# Patient Record
Sex: Female | Born: 1960
Health system: Southern US, Community
[De-identification: ages and names within clinical notes are randomized; demographics above are authoritative.]

## PROBLEM LIST (undated history)

## (undated) DIAGNOSIS — F329 Major depressive disorder, single episode, unspecified: Secondary | ICD-10-CM

## (undated) DIAGNOSIS — F319 Bipolar disorder, unspecified: Secondary | ICD-10-CM

## (undated) DIAGNOSIS — N200 Calculus of kidney: Secondary | ICD-10-CM

## (undated) DIAGNOSIS — B1920 Unspecified viral hepatitis C without hepatic coma: Secondary | ICD-10-CM

## (undated) DIAGNOSIS — F32A Depression, unspecified: Secondary | ICD-10-CM

## (undated) DIAGNOSIS — F419 Anxiety disorder, unspecified: Secondary | ICD-10-CM

---

## 2004-04-17 ENCOUNTER — Emergency Department (HOSPITAL_COMMUNITY): Admission: EM | Admit: 2004-04-17 | Discharge: 2004-04-17 | Payer: Self-pay

## 2006-04-19 ENCOUNTER — Emergency Department (HOSPITAL_COMMUNITY): Admission: EM | Admit: 2006-04-19 | Discharge: 2006-04-19 | Payer: Self-pay | Admitting: *Deleted

## 2009-04-18 ENCOUNTER — Emergency Department (HOSPITAL_COMMUNITY): Admission: EM | Admit: 2009-04-18 | Discharge: 2009-04-19 | Payer: Self-pay | Admitting: Emergency Medicine

## 2009-04-19 ENCOUNTER — Ambulatory Visit: Payer: Self-pay | Admitting: Psychiatry

## 2009-04-19 ENCOUNTER — Inpatient Hospital Stay (HOSPITAL_COMMUNITY): Admission: RE | Admit: 2009-04-19 | Discharge: 2009-04-25 | Payer: Self-pay | Admitting: Psychiatry

## 2009-07-14 ENCOUNTER — Emergency Department (HOSPITAL_COMMUNITY): Admission: EM | Admit: 2009-07-14 | Discharge: 2009-07-15 | Payer: Self-pay | Admitting: Emergency Medicine

## 2010-02-23 ENCOUNTER — Inpatient Hospital Stay (HOSPITAL_COMMUNITY): Admission: EM | Admit: 2010-02-23 | Discharge: 2010-03-02 | Payer: Self-pay | Admitting: Psychiatry

## 2010-02-23 ENCOUNTER — Ambulatory Visit: Payer: Self-pay | Admitting: Psychiatry

## 2010-02-23 ENCOUNTER — Emergency Department (HOSPITAL_COMMUNITY): Admission: EM | Admit: 2010-02-23 | Discharge: 2010-02-23 | Payer: Self-pay | Admitting: Emergency Medicine

## 2010-03-21 ENCOUNTER — Ambulatory Visit: Payer: Self-pay | Admitting: Family Medicine

## 2010-03-21 LAB — CONVERTED CEMR LAB
Amphetamine Screen, Ur: NEGATIVE
Barbiturate Quant, Ur: NEGATIVE
Benzodiazepines.: NEGATIVE
Cocaine Metabolites: NEGATIVE
Creatinine,U: 43.7 mg/dL
Marijuana Metabolite: NEGATIVE
Methadone: NEGATIVE
Opiate Screen, Urine: NEGATIVE
Phencyclidine (PCP): NEGATIVE
Propoxyphene: NEGATIVE

## 2010-03-26 ENCOUNTER — Ambulatory Visit (HOSPITAL_COMMUNITY): Admission: RE | Admit: 2010-03-26 | Discharge: 2010-03-26 | Payer: Self-pay | Admitting: Family Medicine

## 2010-04-06 ENCOUNTER — Emergency Department (HOSPITAL_COMMUNITY): Admission: EM | Admit: 2010-04-06 | Discharge: 2010-04-09 | Payer: Self-pay | Admitting: Emergency Medicine

## 2010-04-13 ENCOUNTER — Emergency Department (HOSPITAL_COMMUNITY): Admission: EM | Admit: 2010-04-13 | Discharge: 2010-04-13 | Payer: Self-pay | Admitting: Emergency Medicine

## 2010-04-19 ENCOUNTER — Ambulatory Visit: Payer: Self-pay | Admitting: Internal Medicine

## 2010-04-19 ENCOUNTER — Encounter (INDEPENDENT_AMBULATORY_CARE_PROVIDER_SITE_OTHER): Payer: Self-pay | Admitting: Family Medicine

## 2010-04-19 LAB — CONVERTED CEMR LAB
ALT: 20 units/L (ref 0–35)
AST: 21 units/L (ref 0–37)
Albumin: 4.2 g/dL (ref 3.5–5.2)
Alkaline Phosphatase: 94 units/L (ref 39–117)
BUN: 10 mg/dL (ref 6–23)
Basophils Absolute: 0 10*3/uL (ref 0.0–0.1)
Basophils Relative: 0 % (ref 0–1)
CO2: 19 meq/L (ref 19–32)
Calcium: 8.8 mg/dL (ref 8.4–10.5)
Chloride: 106 meq/L (ref 96–112)
Cholesterol: 212 mg/dL — ABNORMAL HIGH (ref 0–200)
Creatinine, Ser: 0.62 mg/dL (ref 0.40–1.20)
Eosinophils Absolute: 0.2 10*3/uL (ref 0.0–0.7)
Eosinophils Relative: 2 % (ref 0–5)
Glucose, Bld: 87 mg/dL (ref 70–99)
HCT: 40.4 % (ref 36.0–46.0)
HDL: 65 mg/dL (ref >39)
Hemoglobin: 13.7 g/dL (ref 12.0–15.0)
LDL Cholesterol: 128 mg/dL — ABNORMAL HIGH (ref 0–99)
Lymphocytes Relative: 19 % (ref 12–46)
Lymphs Abs: 2.1 10*3/uL (ref 0.7–4.0)
MCHC: 33.9 g/dL (ref 30.0–36.0)
MCV: 99.8 fL (ref 78.0–100.0)
Monocytes Absolute: 0.6 10*3/uL (ref 0.1–1.0)
Monocytes Relative: 5 % (ref 3–12)
Neutro Abs: 8.2 10*3/uL — ABNORMAL HIGH (ref 1.7–7.7)
Neutrophils Relative %: 73 % (ref 43–77)
Platelets: 310 10*3/uL (ref 150–400)
Potassium: 4.3 meq/L (ref 3.5–5.3)
RBC: 4.05 M/uL (ref 3.87–5.11)
RDW: 14.2 % (ref 11.5–15.5)
Sodium: 136 meq/L (ref 135–145)
Total Bilirubin: 0.5 mg/dL (ref 0.3–1.2)
Total CHOL/HDL Ratio: 3.3
Total Protein: 7.6 g/dL (ref 6.0–8.3)
Triglycerides: 96 mg/dL (ref <150)
VLDL: 19 mg/dL (ref 0–40)
Vit D, 25-Hydroxy: 20 ng/mL — ABNORMAL LOW (ref 30–89)
WBC: 11.1 10*3/uL — ABNORMAL HIGH (ref 4.0–10.5)

## 2010-04-27 ENCOUNTER — Ambulatory Visit: Payer: Self-pay | Admitting: Internal Medicine

## 2010-05-01 ENCOUNTER — Ambulatory Visit (HOSPITAL_COMMUNITY): Admission: RE | Admit: 2010-05-01 | Discharge: 2010-05-01 | Payer: Self-pay | Admitting: Family Medicine

## 2010-10-27 ENCOUNTER — Emergency Department (HOSPITAL_COMMUNITY)
Admission: EM | Admit: 2010-10-27 | Discharge: 2010-10-27 | Payer: Self-pay | Source: Home / Self Care | Admitting: Emergency Medicine

## 2010-11-05 ENCOUNTER — Emergency Department (HOSPITAL_COMMUNITY)
Admission: EM | Admit: 2010-11-05 | Discharge: 2010-12-11 | Disposition: A | Discharge status: Still In Hospital | Payer: Self-pay | Source: Home / Self Care | Admitting: Emergency Medicine

## 2010-11-06 DIAGNOSIS — F1920 Other psychoactive substance dependence, uncomplicated: Secondary | ICD-10-CM

## 2010-11-07 DIAGNOSIS — F1920 Other psychoactive substance dependence, uncomplicated: Secondary | ICD-10-CM

## 2010-11-08 DIAGNOSIS — F1920 Other psychoactive substance dependence, uncomplicated: Secondary | ICD-10-CM

## 2011-02-04 LAB — RAPID URINE DRUG SCREEN, HOSP PERFORMED
Amphetamines: NOT DETECTED
Barbiturates: NOT DETECTED
Benzodiazepines: POSITIVE — AB
Cocaine: POSITIVE — AB
Opiates: NOT DETECTED
Tetrahydrocannabinol: POSITIVE — AB

## 2011-02-04 LAB — CBC
HCT: 40.4 % (ref 36.0–46.0)
Hemoglobin: 13.7 g/dL (ref 12.0–15.0)
MCH: 34.1 pg — ABNORMAL HIGH (ref 26.0–34.0)
MCHC: 33.9 g/dL (ref 30.0–36.0)
MCV: 100.5 fL — ABNORMAL HIGH (ref 78.0–100.0)
Platelets: 298 10*3/uL (ref 150–400)
RBC: 4.02 MIL/uL (ref 3.87–5.11)
RDW: 13 % (ref 11.5–15.5)
WBC: 13.3 10*3/uL — ABNORMAL HIGH (ref 4.0–10.5)

## 2011-02-04 LAB — BASIC METABOLIC PANEL
BUN: 9 mg/dL (ref 6–23)
CO2: 25 mEq/L (ref 19–32)
Calcium: 9.1 mg/dL (ref 8.4–10.5)
Chloride: 107 mEq/L (ref 96–112)
Creatinine, Ser: 0.68 mg/dL (ref 0.4–1.2)
GFR calc Af Amer: >60 mL/min (ref >60)
GFR calc non Af Amer: >60 mL/min (ref >60)
Glucose, Bld: 77 mg/dL (ref 70–99)
Potassium: 3.3 mEq/L — ABNORMAL LOW (ref 3.5–5.1)
Sodium: 138 mEq/L (ref 135–145)

## 2011-02-04 LAB — POCT PREGNANCY, URINE: Preg Test, Ur: NEGATIVE

## 2011-02-04 LAB — ETHANOL: Alcohol, Ethyl (B): <5 mg/dL (ref 0–10)

## 2011-02-04 LAB — DIFFERENTIAL
Basophils Absolute: 0.1 10*3/uL (ref 0.0–0.1)
Basophils Relative: 0 % (ref 0–1)
Eosinophils Absolute: 0.3 10*3/uL (ref 0.0–0.7)
Eosinophils Relative: 2 % (ref 0–5)
Lymphocytes Relative: 19 % (ref 12–46)
Lymphs Abs: 2.5 10*3/uL (ref 0.7–4.0)
Monocytes Absolute: 0.8 10*3/uL (ref 0.1–1.0)
Monocytes Relative: 6 % (ref 3–12)
Neutro Abs: 9.6 10*3/uL — ABNORMAL HIGH (ref 1.7–7.7)
Neutrophils Relative %: 72 % (ref 43–77)

## 2011-02-04 LAB — TRICYCLICS SCREEN, URINE: TCA Scrn: NOT DETECTED

## 2011-02-12 LAB — URINALYSIS, ROUTINE W REFLEX MICROSCOPIC
Bilirubin Urine: NEGATIVE
Glucose, UA: NEGATIVE mg/dL
Hgb urine dipstick: NEGATIVE
Ketones, ur: NEGATIVE mg/dL
Nitrite: NEGATIVE
Protein, ur: NEGATIVE mg/dL
Specific Gravity, Urine: 1.004 — ABNORMAL LOW (ref 1.005–1.030)
Urobilinogen, UA: 0.2 mg/dL (ref 0.0–1.0)
pH: 5 (ref 5.0–8.0)

## 2011-02-12 LAB — RAPID URINE DRUG SCREEN, HOSP PERFORMED
Amphetamines: NOT DETECTED
Barbiturates: NOT DETECTED
Benzodiazepines: NOT DETECTED
Cocaine: POSITIVE — AB
Opiates: POSITIVE — AB
Tetrahydrocannabinol: POSITIVE — AB

## 2011-02-12 LAB — POCT I-STAT, CHEM 8
BUN: 9 mg/dL (ref 6–23)
Calcium, Ion: 1.14 mmol/L (ref 1.12–1.32)
Chloride: 107 mEq/L (ref 96–112)
Creatinine, Ser: 1 mg/dL (ref 0.4–1.2)
Glucose, Bld: 95 mg/dL (ref 70–99)
HCT: 43 % (ref 36.0–46.0)
Hemoglobin: 14.6 g/dL (ref 12.0–15.0)
Potassium: 3.9 mEq/L (ref 3.5–5.1)
Sodium: 141 mEq/L (ref 135–145)
TCO2: 21 mmol/L (ref 0–100)

## 2011-02-12 LAB — ETHANOL
Alcohol, Ethyl (B): 214 mg/dL — ABNORMAL HIGH (ref 0–10)
Alcohol, Ethyl (B): <5 mg/dL (ref 0–10)

## 2011-02-13 LAB — DIFFERENTIAL
Basophils Absolute: 0 10*3/uL (ref 0.0–0.1)
Basophils Relative: 0 % (ref 0–1)
Eosinophils Absolute: 0.2 10*3/uL (ref 0.0–0.7)
Eosinophils Relative: 1 % (ref 0–5)
Lymphocytes Relative: 20 % (ref 12–46)
Lymphs Abs: 2.9 10*3/uL (ref 0.7–4.0)
Monocytes Absolute: 0.8 10*3/uL (ref 0.1–1.0)
Monocytes Relative: 5 % (ref 3–12)
Neutro Abs: 10.8 10*3/uL — ABNORMAL HIGH (ref 1.7–7.7)
Neutrophils Relative %: 74 % (ref 43–77)

## 2011-02-13 LAB — RAPID URINE DRUG SCREEN, HOSP PERFORMED
Amphetamines: NOT DETECTED
Barbiturates: NOT DETECTED
Benzodiazepines: NOT DETECTED
Cocaine: POSITIVE — AB
Opiates: NOT DETECTED
Tetrahydrocannabinol: POSITIVE — AB

## 2011-02-13 LAB — COMPREHENSIVE METABOLIC PANEL
ALT: 32 U/L (ref 0–35)
AST: 32 U/L (ref 0–37)
Albumin: 3.9 g/dL (ref 3.5–5.2)
Alkaline Phosphatase: 82 U/L (ref 39–117)
BUN: 4 mg/dL — ABNORMAL LOW (ref 6–23)
CO2: 20 mEq/L (ref 19–32)
Calcium: 8.6 mg/dL (ref 8.4–10.5)
Chloride: 108 mEq/L (ref 96–112)
Creatinine, Ser: 0.48 mg/dL (ref 0.4–1.2)
GFR calc Af Amer: >60 mL/min (ref >60)
GFR calc non Af Amer: >60 mL/min (ref >60)
Glucose, Bld: 95 mg/dL (ref 70–99)
Potassium: 3.5 mEq/L (ref 3.5–5.1)
Sodium: 138 mEq/L (ref 135–145)
Total Bilirubin: 0.4 mg/dL (ref 0.3–1.2)
Total Protein: 7.9 g/dL (ref 6.0–8.3)

## 2011-02-13 LAB — CBC
HCT: 38.5 % (ref 36.0–46.0)
Hemoglobin: 12.9 g/dL (ref 12.0–15.0)
MCHC: 33.6 g/dL (ref 30.0–36.0)
MCV: 103.3 fL — ABNORMAL HIGH (ref 78.0–100.0)
Platelets: 258 10*3/uL (ref 150–400)
RBC: 3.73 MIL/uL — ABNORMAL LOW (ref 3.87–5.11)
RDW: 13.9 % (ref 11.5–15.5)
WBC: 14.7 10*3/uL — ABNORMAL HIGH (ref 4.0–10.5)

## 2011-02-13 LAB — LIPASE, BLOOD: Lipase: 24 U/L (ref 11–59)

## 2011-02-13 LAB — ETHANOL: Alcohol, Ethyl (B): 135 mg/dL — ABNORMAL HIGH (ref 0–10)

## 2011-03-05 LAB — RAPID URINE DRUG SCREEN, HOSP PERFORMED
Amphetamines: NOT DETECTED
Barbiturates: NOT DETECTED
Benzodiazepines: NOT DETECTED
Cocaine: POSITIVE — AB
Opiates: NOT DETECTED
Tetrahydrocannabinol: POSITIVE — AB

## 2011-03-05 LAB — COMPREHENSIVE METABOLIC PANEL
ALT: 41 U/L — ABNORMAL HIGH (ref 0–35)
AST: 35 U/L (ref 0–37)
Albumin: 3.7 g/dL (ref 3.5–5.2)
Alkaline Phosphatase: 114 U/L (ref 39–117)
BUN: 9 mg/dL (ref 6–23)
CO2: 23 mEq/L (ref 19–32)
Calcium: 9.2 mg/dL (ref 8.4–10.5)
Chloride: 107 mEq/L (ref 96–112)
Creatinine, Ser: 0.66 mg/dL (ref 0.4–1.2)
GFR calc Af Amer: >60 mL/min (ref >60)
GFR calc non Af Amer: >60 mL/min (ref >60)
Glucose, Bld: 93 mg/dL (ref 70–99)
Potassium: 3.7 mEq/L (ref 3.5–5.1)
Sodium: 136 mEq/L (ref 135–145)
Total Bilirubin: 0.5 mg/dL (ref 0.3–1.2)
Total Protein: 7.8 g/dL (ref 6.0–8.3)

## 2011-03-05 LAB — CBC
HCT: 40.4 % (ref 36.0–46.0)
Hemoglobin: 13.9 g/dL (ref 12.0–15.0)
MCHC: 34.3 g/dL (ref 30.0–36.0)
MCV: 100.2 fL — ABNORMAL HIGH (ref 78.0–100.0)
Platelets: 221 10*3/uL (ref 150–400)
RBC: 4.03 MIL/uL (ref 3.87–5.11)
RDW: 15.1 % (ref 11.5–15.5)
WBC: 12.1 10*3/uL — ABNORMAL HIGH (ref 4.0–10.5)

## 2011-03-05 LAB — URINALYSIS, ROUTINE W REFLEX MICROSCOPIC
Bilirubin Urine: NEGATIVE
Glucose, UA: NEGATIVE mg/dL
Hgb urine dipstick: NEGATIVE
Ketones, ur: 15 mg/dL — AB
Nitrite: NEGATIVE
Protein, ur: NEGATIVE mg/dL
Specific Gravity, Urine: 1.033 — ABNORMAL HIGH (ref 1.005–1.030)
Urobilinogen, UA: 1 mg/dL (ref 0.0–1.0)
pH: 5 (ref 5.0–8.0)

## 2011-03-05 LAB — URINE CULTURE: Colony Count: 70000

## 2011-03-05 LAB — ACETAMINOPHEN LEVEL: Acetaminophen (Tylenol), Serum: <10 ug/mL — ABNORMAL LOW (ref 10–30)

## 2011-03-05 LAB — DIFFERENTIAL
Basophils Absolute: 0 10*3/uL (ref 0.0–0.1)
Basophils Relative: 0 % (ref 0–1)
Eosinophils Absolute: 0.2 10*3/uL (ref 0.0–0.7)
Eosinophils Relative: 2 % (ref 0–5)
Lymphocytes Relative: 17 % (ref 12–46)
Lymphs Abs: 2 10*3/uL (ref 0.7–4.0)
Monocytes Absolute: 0.9 10*3/uL (ref 0.1–1.0)
Monocytes Relative: 8 % (ref 3–12)
Neutro Abs: 8.9 10*3/uL — ABNORMAL HIGH (ref 1.7–7.7)
Neutrophils Relative %: 74 % (ref 43–77)

## 2011-03-05 LAB — URINE MICROSCOPIC-ADD ON

## 2011-03-05 LAB — TRICYCLICS SCREEN, URINE: TCA Scrn: POSITIVE — AB

## 2011-03-05 LAB — ETHANOL: Alcohol, Ethyl (B): <5 mg/dL (ref 0–10)

## 2011-03-05 LAB — SALICYLATE LEVEL: Salicylate Lvl: <4 mg/dL (ref 2.8–20.0)
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
## 2011-07-01 ENCOUNTER — Emergency Department (HOSPITAL_COMMUNITY)
Admission: EM | Admit: 2011-07-01 | Discharge: 2011-07-02 | Disposition: A | Discharge status: Home / Self Care | Payer: Self-pay | Attending: Emergency Medicine | Admitting: Emergency Medicine

## 2011-07-01 DIAGNOSIS — M25569 Pain in unspecified knee: Secondary | ICD-10-CM | POA: Insufficient documentation

## 2011-07-01 DIAGNOSIS — M25469 Effusion, unspecified knee: Secondary | ICD-10-CM | POA: Insufficient documentation

## 2011-07-02 ENCOUNTER — Emergency Department (HOSPITAL_COMMUNITY): Payer: Self-pay

## 2012-08-01 ENCOUNTER — Encounter (HOSPITAL_COMMUNITY): Payer: Self-pay | Admitting: Emergency Medicine

## 2012-08-01 ENCOUNTER — Emergency Department (HOSPITAL_COMMUNITY)
Admission: EM | Admit: 2012-08-01 | Discharge: 2012-08-01 | Disposition: A | Discharge status: Home / Self Care | Payer: Self-pay | Attending: Emergency Medicine | Admitting: Emergency Medicine

## 2012-08-01 DIAGNOSIS — F319 Bipolar disorder, unspecified: Secondary | ICD-10-CM | POA: Insufficient documentation

## 2012-08-01 DIAGNOSIS — F411 Generalized anxiety disorder: Secondary | ICD-10-CM | POA: Insufficient documentation

## 2012-08-01 DIAGNOSIS — M1990 Unspecified osteoarthritis, unspecified site: Secondary | ICD-10-CM

## 2012-08-01 DIAGNOSIS — M25469 Effusion, unspecified knee: Secondary | ICD-10-CM | POA: Insufficient documentation

## 2012-08-01 DIAGNOSIS — M25461 Effusion, right knee: Secondary | ICD-10-CM

## 2012-08-01 DIAGNOSIS — F17200 Nicotine dependence, unspecified, uncomplicated: Secondary | ICD-10-CM | POA: Insufficient documentation

## 2012-08-01 HISTORY — DX: Major depressive disorder, single episode, unspecified: F32.9

## 2012-08-01 HISTORY — DX: Depression, unspecified: F32.A

## 2012-08-01 HISTORY — DX: Bipolar disorder, unspecified: F31.9

## 2012-08-01 HISTORY — DX: Anxiety disorder, unspecified: F41.9

## 2012-08-01 MED ORDER — HYDROCODONE-ACETAMINOPHEN 5-500 MG PO TABS: 1.0000 | ORAL_TABLET | Freq: Four times a day (QID) | ORAL | Status: AC | PRN

## 2012-08-01 MED ORDER — LIDOCAINE HCL (PF) 1 % IJ SOLN
INTRAMUSCULAR | Status: AC
  Filled 2012-08-01: qty 5

## 2012-08-01 MED ORDER — METHYLPREDNISOLONE ACETATE 40 MG/ML IJ SUSP
40.0000 mg | Freq: Once | INTRAMUSCULAR | Status: DC
  Filled 2012-08-01: qty 5

## 2012-08-01 MED ORDER — HYDROCODONE-ACETAMINOPHEN 5-325 MG PO TABS
2.0000 | ORAL_TABLET | Freq: Once | ORAL | Status: AC
  Administered 2012-08-01: 2 via ORAL
  Filled 2012-08-01: qty 2

## 2012-08-01 NOTE — ED Notes (Signed)
Pt. Stated, My rt. Knee went out again.

## 2012-08-01 NOTE — ED Provider Notes (Addendum)
History   This chart was scribed for No att. providers found by Toya Smothers. The patient was seen in room TR04C/TR04C. Patient's care was started at 1247.  CSN: [id number]  Arrival date & time 08/01/12  1247   None     Chief Complaint  Patient presents with  . Knee Pain   Patient is a 51 y.o. female presenting with knee pain. The history is provided by the patient. No language interpreter was used.  Knee Pain Pertinent negatives include no chest pain, no abdominal pain and no headaches.    Kimberly [name] is a 51 y.o. female with a h/o of knee pain who presents to the Emergency Department complaining of 5 hours of of recurrent moderate right knee pain. Pt reports that her right knee suddenly "gave out" while walking, causing her to fall. Pain is moderate, constant, aggravated with flexion and extension, and relieved with stabilization. Pain has increased acutely since onset. Pt denotes that in the past pain has been treated with cortisone shots. She denies back pain, neck pain, LOC, fever, and cough.    Past Medical History  Diagnosis Date  . Depression   . Bipolar 1 disorder   . Anxiety     History reviewed. No pertinent past surgical history.  No family history on file.  History  Substance Use Topics  . Smoking status: Current Everyday Smoker  . Smokeless tobacco: Not on file  . Alcohol Use: Yes   Review of Systems  Constitutional: Negative for fatigue.  HENT: Negative for congestion, sinus pressure and ear discharge.   Eyes: Negative for discharge.  Respiratory: Negative for cough.   Cardiovascular: Negative for chest pain.  Gastrointestinal: Negative for abdominal pain and diarrhea.  Genitourinary: Negative for frequency and hematuria.  Musculoskeletal: Positive for arthralgias (Right Knee Pain). Negative for back pain.  Skin: Negative for rash.  Neurological: Negative for seizures and headaches.  Hematological: Negative.   Psychiatric/Behavioral: Negative for  hallucinations.  All other systems reviewed and are negative.    Allergies  Review of patient's allergies indicates no known allergies.  Home Medications  No current outpatient prescriptions on file.  BP 98/65  Pulse 66  Temp 98.4 F (36.9 C) (Oral)  Resp 20  SpO2 97%  LMP 07/14/2012  Physical Exam  Nursing note and vitals reviewed. Constitutional: She is oriented to person, place, and time. She appears well-developed and well-nourished. No distress.  HENT:  Head: Normocephalic and atraumatic.  Neck: Normal range of motion. Neck supple.  Musculoskeletal:       The right knee is noted to have a small to moderate sized effusion.  There is crepitus with range of motion.  It appears to be stable ap/laterally.  Distal pulses, motor, and sensory are intact.  Neurological: She is alert and oriented to person, place, and time.  Skin: Skin is warm and dry. She is not diaphoretic.    ED Course  Procedures (including critical care time) DIAGNOSTIC STUDIES: Oxygen Saturation is 97% on room air, normal by my interpretation.    COORDINATION OF CARE: 13:40- evaluated Pt. Pt is awake, alert, and oriented. 14:00- Ordered methylPREDNISolone acetate (DEPO-MEDROL) injection 40 mg Once.  Labs Reviewed - No data to display No results found.   No diagnosis found.  After consent was obtained, using sterile technique the right knee was prepped and plain Lidocaine 1% was used as local anesthetic. The joint was entered and 10 ml's of yellow, clear colored fluid was withdrawn.  Steroid 40 mg  and 5 ml plain Lidocaine was then injected and the needle withdrawn.  The procedure was well tolerated.  The patient is asked to continue to rest the joint for a few more days before resuming regular activities.  It may be more painful for the first 1-2 days.  Watch for fever, or increased swelling or persistent pain in the joint. Call or return to clinic prn if such symptoms occur or there is failure to  improve as anticipated.  MDM  Will treat with pain meds, immoblizer.  Follow up prn if not improving.      I personally performed the services described in this documentation, which was scribed in my presence. The recorded information has been reviewed and considered.      Kimberly Lyons, MD 08/01/12 1410  Kimberly Lyons, MD 09/10/12 [phone]

## 2012-08-01 NOTE — Progress Notes (Signed)
Orthopedic Tech Progress Note Patient Details:  Kimberly [name] 1961-04-17 [id number]  Ortho Devices Type of Ortho Device: Crutches;Knee Immobilizer Ortho Device/Splint Location: (R) LE Ortho Device/Splint Interventions: Application   Jennye Moccasin 08/01/2012, 2:43 PM

## 2012-10-16 ENCOUNTER — Encounter (HOSPITAL_COMMUNITY): Payer: Self-pay | Admitting: *Deleted

## 2012-10-16 ENCOUNTER — Emergency Department (HOSPITAL_COMMUNITY)
Admission: EM | Admit: 2012-10-16 | Discharge: 2012-10-16 | Disposition: A | Discharge status: Home / Self Care | Payer: Self-pay | Attending: Emergency Medicine | Admitting: Emergency Medicine

## 2012-10-16 ENCOUNTER — Emergency Department (HOSPITAL_COMMUNITY): Payer: Self-pay

## 2012-10-16 DIAGNOSIS — F17200 Nicotine dependence, unspecified, uncomplicated: Secondary | ICD-10-CM | POA: Insufficient documentation

## 2012-10-16 DIAGNOSIS — E079 Disorder of thyroid, unspecified: Secondary | ICD-10-CM | POA: Insufficient documentation

## 2012-10-16 DIAGNOSIS — M25569 Pain in unspecified knee: Secondary | ICD-10-CM | POA: Insufficient documentation

## 2012-10-16 DIAGNOSIS — F1010 Alcohol abuse, uncomplicated: Secondary | ICD-10-CM | POA: Insufficient documentation

## 2012-10-16 DIAGNOSIS — Z8659 Personal history of other mental and behavioral disorders: Secondary | ICD-10-CM | POA: Insufficient documentation

## 2012-10-16 MED ORDER — ACETAMINOPHEN 325 MG PO TABS
650.0000 mg | ORAL_TABLET | Freq: Once | ORAL | Status: AC
  Administered 2012-10-16: 650 mg via ORAL
  Filled 2012-10-16: qty 2
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
## 2012-10-16 NOTE — ED Provider Notes (Signed)
Medical screening examination/treatment/procedure(s) were performed by non-physician practitioner and as supervising physician I was immediately available for consultation/collaboration.   Charles B. Bernette Mayers, MD 10/16/12 [phone]

## 2013-12-16 ENCOUNTER — Emergency Department (HOSPITAL_COMMUNITY): Payer: Self-pay

## 2013-12-16 ENCOUNTER — Encounter (HOSPITAL_COMMUNITY): Payer: Self-pay | Admitting: Emergency Medicine

## 2013-12-16 ENCOUNTER — Emergency Department (HOSPITAL_COMMUNITY)
Admission: EM | Admit: 2013-12-16 | Discharge: 2013-12-16 | Disposition: A | Discharge status: Home / Self Care | Payer: Self-pay | Attending: Emergency Medicine | Admitting: Emergency Medicine

## 2013-12-16 DIAGNOSIS — S0181XA Laceration without foreign body of other part of head, initial encounter: Secondary | ICD-10-CM

## 2013-12-16 DIAGNOSIS — F3289 Other specified depressive episodes: Secondary | ICD-10-CM | POA: Insufficient documentation

## 2013-12-16 DIAGNOSIS — Z79899 Other long term (current) drug therapy: Secondary | ICD-10-CM | POA: Insufficient documentation

## 2013-12-16 DIAGNOSIS — F411 Generalized anxiety disorder: Secondary | ICD-10-CM | POA: Insufficient documentation

## 2013-12-16 DIAGNOSIS — S0590XA Unspecified injury of unspecified eye and orbit, initial encounter: Secondary | ICD-10-CM | POA: Insufficient documentation

## 2013-12-16 DIAGNOSIS — S0990XA Unspecified injury of head, initial encounter: Secondary | ICD-10-CM | POA: Insufficient documentation

## 2013-12-16 DIAGNOSIS — Z8639 Personal history of other endocrine, nutritional and metabolic disease: Secondary | ICD-10-CM | POA: Insufficient documentation

## 2013-12-16 DIAGNOSIS — Z862 Personal history of diseases of the blood and blood-forming organs and certain disorders involving the immune mechanism: Secondary | ICD-10-CM | POA: Insufficient documentation

## 2013-12-16 DIAGNOSIS — F329 Major depressive disorder, single episode, unspecified: Secondary | ICD-10-CM | POA: Insufficient documentation

## 2013-12-16 DIAGNOSIS — F17200 Nicotine dependence, unspecified, uncomplicated: Secondary | ICD-10-CM | POA: Insufficient documentation

## 2013-12-16 DIAGNOSIS — Z23 Encounter for immunization: Secondary | ICD-10-CM | POA: Insufficient documentation

## 2013-12-16 DIAGNOSIS — S0180XA Unspecified open wound of other part of head, initial encounter: Secondary | ICD-10-CM | POA: Insufficient documentation

## 2013-12-16 MED ORDER — TETRACAINE HCL 0.5 % OP SOLN
1.0000 [drp] | Freq: Once | OPHTHALMIC | Status: DC
  Filled 2013-12-16: qty 2

## 2013-12-16 MED ORDER — TETANUS-DIPHTH-ACELL PERTUSSIS 5-2.5-18.5 LF-MCG/0.5 IM SUSP
0.5000 mL | Freq: Once | INTRAMUSCULAR | Status: AC
  Administered 2013-12-16: 0.5 mL via INTRAMUSCULAR
  Filled 2013-12-16: qty 0.5

## 2013-12-16 MED ORDER — OXYCODONE-ACETAMINOPHEN 5-325 MG PO TABS
1.0000 | ORAL_TABLET | Freq: Once | ORAL | Status: AC
  Administered 2013-12-16: 1 via ORAL
  Filled 2013-12-16: qty 1

## 2013-12-16 MED ORDER — FLUORESCEIN SODIUM 1 MG OP STRP
1.0000 | ORAL_STRIP | Freq: Once | OPHTHALMIC | Status: DC
Start: 2013-12-16 — End: 2013-12-16
  Filled 2013-12-16: qty 1

## 2013-12-16 MED ORDER — HYDROCODONE-ACETAMINOPHEN 5-325 MG PO TABS: 1.0000 | ORAL_TABLET | ORAL | Status: DC | PRN
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
## 2013-12-16 NOTE — ED Notes (Signed)
Pt transported to CT ?
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
## 2013-12-16 NOTE — ED Notes (Signed)
Pt given d/c instructions and verbalized understanding.  

## 2013-12-16 NOTE — Discharge Instructions (Signed)
Take vicodin as prescribed for severe pain.  Do not drive within four hours of taking this medication (may cause drowsiness or confusion).   Take ibuprofen as well; up to 800mg  three times a day with food.  Apply an ice pack to the left side of your face for 15-20 minutes, 2 times a day.   Stitches should be removed in 3-5 days by your primary care doctor or [facility].  Return sooner if you develop fever or drainage of pus from wound. You should return to the ER if you develop worsening headache, blurred vision, change in speech/behavior, excessive drowsiness, or 2 or more episodes of vomiting over the next 24 hours.  Facial Laceration A facial laceration is a cut on the face. These injuries can be painful and cause bleeding. Some cuts may need to be closed with stitches (sutures), skin adhesive strips, or wound glue. Cuts usually heal quickly but can leave a scar. It can take 1 2 years for the scar to go away completely. HOME CARE   Only take medicines as told by your doctor.  Follow your doctor's instructions for wound care. For Stitches:  Keep the cut clean and dry.  If you have a bandage (dressing), change it at least once a day. Change the bandage if it gets wet or dirty, or as told by your doctor.  Wash the cut with soap and water 2 times a day. Rinse the cut with water. Pat it dry with a clean towel.  Put a thin layer of medicated cream on the cut as told by your doctor.  You may shower after the first 24 hours. Do not soak the cut in water until the stitches are removed.  Have your stitches removed as told by your doctor.  Do not wear any makeup until a few days after your stitches are removed. For Skin Adhesive Strips:  Keep the cut clean and dry.  Do not get the strips wet. You may take a bath, but be careful to keep the cut dry.  If the cut gets wet, pat it dry with a clean towel.  The strips will fall off on their own. Do not remove the strips that are still  stuck to the cut. For Wound Glue:  You may shower or take baths. Do not soak or scrub the cut. Do not swim. Avoid heavy sweating until the glue falls off on its own. After a shower or bath, pat the cut dry with a clean towel.  Do not put medicine or makeup on your cut until the glue falls off.  If you have a bandage, do not put tape over the glue.  Avoid lots of sunlight or tanning lamps until the glue falls off.  The glue will fall off on its own in 5 10 days. Do not pick at the glue. After Healing: Put sunscreen on the cut for the first year to reduce your scar. GET HELP RIGHT AWAY IF:   Your cut area gets red, painful, or puffy (swollen).  You see a yellowish-white fluid (pus) coming from the cut.  You have chills or a fever. MAKE SURE YOU:   Understand these instructions.  Will watch your condition.  Will get help right away if you are not doing well or get worse. Document Released: 04/29/2008 Document Revised: 09/01/2013 Document Reviewed: 06/24/2013 [facility] Patient Information 2014 [place], [place].

## 2013-12-17 NOTE — ED Provider Notes (Signed)
Medical screening examination/treatment/procedure(s) were performed by non-physician practitioner and as supervising physician I was immediately available for consultation/collaboration.     Elyn Peers, MD 12/17/13 [phone]

## 2013-12-22 ENCOUNTER — Emergency Department (INDEPENDENT_AMBULATORY_CARE_PROVIDER_SITE_OTHER)
Admission: EM | Admit: 2013-12-22 | Discharge: 2013-12-22 | Disposition: A | Discharge status: Home / Self Care | Payer: Self-pay | Source: Home / Self Care | Attending: Emergency Medicine | Admitting: Emergency Medicine

## 2013-12-22 ENCOUNTER — Encounter (HOSPITAL_COMMUNITY): Payer: Self-pay | Admitting: Emergency Medicine

## 2013-12-22 DIAGNOSIS — T148XXA Other injury of unspecified body region, initial encounter: Secondary | ICD-10-CM

## 2013-12-22 DIAGNOSIS — IMO0002 Reserved for concepts with insufficient information to code with codable children: Secondary | ICD-10-CM
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
## 2013-12-22 NOTE — Discharge Instructions (Signed)
Wash with soap and water, apply antibiotic ointment for next 2 to 3 days.  Cover with Band Aid today, then can leave open.

## 2014-08-25 ENCOUNTER — Encounter (HOSPITAL_COMMUNITY): Payer: Self-pay | Admitting: Emergency Medicine

## 2014-08-25 ENCOUNTER — Emergency Department (HOSPITAL_COMMUNITY)
Admission: EM | Admit: 2014-08-25 | Discharge: 2014-08-25 | Disposition: A | Discharge status: Home / Self Care | Payer: Self-pay | Attending: Emergency Medicine | Admitting: Emergency Medicine

## 2014-08-25 DIAGNOSIS — Z8639 Personal history of other endocrine, nutritional and metabolic disease: Secondary | ICD-10-CM | POA: Insufficient documentation

## 2014-08-25 DIAGNOSIS — F419 Anxiety disorder, unspecified: Secondary | ICD-10-CM | POA: Insufficient documentation

## 2014-08-25 DIAGNOSIS — F329 Major depressive disorder, single episode, unspecified: Secondary | ICD-10-CM | POA: Insufficient documentation

## 2014-08-25 DIAGNOSIS — Z720 Tobacco use: Secondary | ICD-10-CM | POA: Insufficient documentation

## 2014-08-25 DIAGNOSIS — Z79899 Other long term (current) drug therapy: Secondary | ICD-10-CM | POA: Insufficient documentation

## 2014-08-25 DIAGNOSIS — F1010 Alcohol abuse, uncomplicated: Secondary | ICD-10-CM | POA: Insufficient documentation

## 2014-08-25 DIAGNOSIS — F1210 Cannabis abuse, uncomplicated: Secondary | ICD-10-CM | POA: Insufficient documentation

## 2014-08-25 LAB — COMPREHENSIVE METABOLIC PANEL
ALT: 82 U/L — ABNORMAL HIGH (ref 0–35)
AST: 63 U/L — ABNORMAL HIGH (ref 0–37)
Albumin: 4 g/dL (ref 3.5–5.2)
Alkaline Phosphatase: 116 U/L (ref 39–117)
Anion gap: 11 (ref 5–15)
BUN: 8 mg/dL (ref 6–23)
CO2: 25 mEq/L (ref 19–32)
Calcium: 9.1 mg/dL (ref 8.4–10.5)
Chloride: 106 mEq/L (ref 96–112)
Creatinine, Ser: 0.62 mg/dL (ref 0.50–1.10)
GFR calc Af Amer: >90 mL/min (ref >90)
GFR calc non Af Amer: >90 mL/min (ref >90)
Glucose, Bld: 100 mg/dL — ABNORMAL HIGH (ref 70–99)
Potassium: 4.1 mEq/L (ref 3.7–5.3)
Sodium: 142 mEq/L (ref 137–147)
Total Bilirubin: 0.3 mg/dL (ref 0.3–1.2)
Total Protein: 8.3 g/dL (ref 6.0–8.3)

## 2014-08-25 LAB — CBC
HCT: 43 % (ref 36.0–46.0)
Hemoglobin: 14.7 g/dL (ref 12.0–15.0)
MCH: 36.3 pg — ABNORMAL HIGH (ref 26.0–34.0)
MCHC: 34.2 g/dL (ref 30.0–36.0)
MCV: 106.2 fL — ABNORMAL HIGH (ref 78.0–100.0)
Platelets: 193 10*3/uL (ref 150–400)
RBC: 4.05 MIL/uL (ref 3.87–5.11)
RDW: 13.2 % (ref 11.5–15.5)
WBC: 10 10*3/uL (ref 4.0–10.5)

## 2014-08-25 LAB — RAPID URINE DRUG SCREEN, HOSP PERFORMED
Amphetamines: NOT DETECTED
Barbiturates: NOT DETECTED
Benzodiazepines: NOT DETECTED
Cocaine: NOT DETECTED
Opiates: NOT DETECTED
Tetrahydrocannabinol: POSITIVE — AB

## 2014-08-25 LAB — ETHANOL: Alcohol, Ethyl (B): <11 mg/dL (ref 0–11)

## 2014-08-25 LAB — SALICYLATE LEVEL: Salicylate Lvl: <2 mg/dL — ABNORMAL LOW (ref 2.8–20.0)

## 2014-08-25 LAB — ACETAMINOPHEN LEVEL: Acetaminophen (Tylenol), Serum: <15 ug/mL (ref 10–30)

## 2014-08-25 MED ORDER — LORAZEPAM 1 MG PO TABS
0.0000 mg | ORAL_TABLET | Freq: Four times a day (QID) | ORAL | Status: DC
  Administered 2014-08-25: 1 mg via ORAL
  Filled 2014-08-25: qty 1

## 2014-08-25 MED ORDER — LORAZEPAM 1 MG PO TABS: 0.0000 mg | ORAL_TABLET | Freq: Two times a day (BID) | ORAL | Status: DC

## 2014-08-25 MED ORDER — FLUOXETINE HCL 20 MG PO TABS: 20.0000 mg | ORAL_TABLET | Freq: Every day | ORAL | Status: DC

## 2014-08-25 MED ORDER — TRAZODONE HCL 50 MG PO TABS: 50.0000 mg | ORAL_TABLET | Freq: Every day | ORAL | Status: DC

## 2014-08-25 MED ORDER — RISPERIDONE 2 MG PO TABS: 2.0000 mg | ORAL_TABLET | Freq: Every day | ORAL | Status: DC
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
## 2014-08-25 NOTE — ED Notes (Signed)
Pt. Stated, "I am alcohol sick"

## 2014-08-25 NOTE — ED Notes (Signed)
To be discharged but Dr. Vanita Panda set up social work consult.

## 2014-08-25 NOTE — ED Notes (Signed)
TTS set up in room and pt speaking with consult. Pt has been wanded. No SI/HI

## 2014-08-25 NOTE — ED Notes (Signed)
PT placed in blue scrubs, security to wand pt.

## 2014-08-25 NOTE — Clinical Social Work Note (Signed)
Clinical Social Worker met with patient at bedside who states that she drinks 10 40 oz beers a day and has been for many years.  Patient states that she has pre-arranged to go to [facility] and they required her to come to the ED for detox and medical clearance prior to admission.  CSW called [facility] and they state that patient has a screening appointment arranged for Monday 08/29/2014 in which she is supposed to meet them in parking lot of the [place] off [address].  Arbie Cookey at [facility] states that patient can be discharged from the hospital and meet in [place] parking log at 07:40am on Monday morning with discharge paperwork.  CSW has relayed message to patient at bedside.  Patient states that she was not told the same information and must remain here for complete detox until Monday.  CSW encouraged patient to contact [facility] directly for them to provide the same instructions already provided by CSW.  CSW to provide patient with two bus passes in order to get home today and to [place] on Monday.  Clinical Social Worker will sign off for now as social work intervention is no longer needed. Please consult [place] again if new need arises.  Barbette Or, [place]

## 2014-08-25 NOTE — BH Assessment (Signed)
Spoke with EDP Dr.Lockwood to obtain clinicals prior to assessing patient.  Shaune Pollack, MS, [place] Assessment Counselor

## 2014-08-25 NOTE — ED Provider Notes (Addendum)
CSN: [id number]     Arrival date & time 08/25/14  0847 History   First MD Initiated Contact with Patient 08/25/14 [phone]     Chief Complaint  Patient presents with  . Medical Clearance  . Alcohol Problem     (Consider location/radiation/quality/duration/timing/severity/associated sxs/prior Treatment) HPI This patient with a history of psychiatric disease, substantial alcohol use now presents with a request for assistance with alcohol detox, management of her hallucinations. The patient continues to see small red man, and her voices. Patient can use to drink substantial amounts of alcohol, last yesterday. She denies suicidal or homicidal ideation. She states that she last took her psychiatric medication one month ago. Patient spoke with outpatient counseling Center, has assessment in 4 days    Past Medical History  Diagnosis Date  . Depression   . Bipolar 1 disorder   . Anxiety   . Thyroid disease    History reviewed. No pertinent past surgical history. History reviewed. No pertinent family history. History  Substance Use Topics  . Smoking status: Current Every Day Smoker  . Smokeless tobacco: Not on file  . Alcohol Use: Yes   OB History   Grav Para Term Preterm Abortions TAB SAB Ect Mult Living                 Review of Systems  Constitutional:       Per HPI, otherwise negative  HENT:       Per HPI, otherwise negative  Respiratory:       Per HPI, otherwise negative  Cardiovascular:       Per HPI, otherwise negative  Gastrointestinal: Negative for vomiting.  Endocrine:       Negative aside from HPI  Genitourinary:       Neg aside from HPI   Musculoskeletal:       Per HPI, otherwise negative  Skin: Negative.   Neurological: Negative for syncope.  Psychiatric/Behavioral: Positive for hallucinations, dysphoric mood and decreased concentration. Negative for suicidal ideas. The patient is nervous/anxious.       Allergies  Review of patient's allergies  indicates no known allergies.  Home Medications   Prior to Admission medications   Medication Sig Start Date End Date Taking? Authorizing Provider  BusPIRone HCl (BUSPAR PO) Take 2 mg by mouth at bedtime.    Historical Provider, MD  FLUoxetine (PROZAC) 40 MG capsule Take 40 mg by mouth daily.    Historical Provider, MD  HYDROcodone-acetaminophen (NORCO/VICODIN) 5-325 MG per tablet Take 1 tablet by mouth every 4 (four) hours as needed for moderate pain. 12/16/13   Arville Lime Schinlever, PA-C  traZODone (DESYREL) 100 MG tablet Take 100 mg by mouth at bedtime.    Historical Provider, MD   BP 143/81  Pulse 86  Temp(Src) 97.6 F (36.4 C) (Oral)  Resp 16  SpO2 97% Physical Exam  Nursing note and vitals reviewed. Constitutional: She is oriented to person, place, and time. She appears well-developed and well-nourished. No distress.  HENT:  Head: Normocephalic and atraumatic.  Eyes: Conjunctivae and EOM are normal.  Cardiovascular: Normal rate and regular rhythm.   Pulmonary/Chest: Effort normal and breath sounds normal. No stridor. No respiratory distress.  Abdominal: She exhibits no distension.  Musculoskeletal: She exhibits no edema.  Neurological: She is alert and oriented to person, place, and time. No cranial nerve deficit.  Skin: Skin is warm and dry.  Psychiatric: She is slowed. Cognition and memory are impaired. She expresses no homicidal and no suicidal ideation. She  expresses no suicidal plans and no homicidal plans.    ED Course  Procedures (including critical care time) Labs Review Labs Reviewed  CBC - Abnormal; Notable for the following:    MCV 106.2 (*)    MCH 36.3 (*)    All other components within normal limits  COMPREHENSIVE METABOLIC PANEL - Abnormal; Notable for the following:    Glucose, Bld 100 (*)    AST 63 (*)    ALT 82 (*)    All other components within normal limits  SALICYLATE LEVEL - Abnormal; Notable for the following:    Salicylate Lvl <7.5 (*)     All other components within normal limits  URINE RAPID DRUG SCREEN (HOSP PERFORMED) - Abnormal; Notable for the following:    Tetrahydrocannabinol POSITIVE (*)    All other components within normal limits  ACETAMINOPHEN LEVEL  ETHANOL      MDM     This female with a history of alcohol dependence, psychiatric disorder presents with request for assistance.  Patient is awake, alert, mildly tachycardic, but otherwise hemodynamically stable. Vision had a psychiatric consultation and her history of bipolar disease, lack of medication use, dependency on alcohol. Patient was medically clear for further evaluation with our behavioral health team.    Carmin Muskrat, MD 08/25/14 1056  2:27 PM After discussion with behavioral health, the patient does not meet criteria for inpatient alcohol abuse treatment. I discussed this case with our case management as well to facilitate assistance with payment for medication. The patient requested no paper copies of her meds, this was accommodated. Patient is discharged to follow up as scheduled in 4 days for inpatient alcohol abuse counseling.  Carmin Muskrat, MD 08/25/14 [phone]

## 2014-08-25 NOTE — Discharge Instructions (Signed)
Please be sure to keep your scheduled appointment with your behavioral health center and counseling Center

## 2014-08-25 NOTE — ED Notes (Signed)
Given meal with ginger ale and warm blankets

## 2014-08-25 NOTE — ED Notes (Signed)
Pt here requesting detox from ETOH; pt sts last drank yesterday and drinks approx 10 40oz a day; pt c/o shaking at present and denies SI/HI

## 2014-08-25 NOTE — BH Assessment (Signed)
TTS assessment in progress.  Shaune Pollack, MS, [place] Assessment Counselor

## 2014-08-25 NOTE — BH Assessment (Signed)
TTS assessment complete.  Consulted with Agustina Caroli Psychiatric extender whom is recommending that patient be d/c with outpatient SA resources and continue will plan to follow-up with [facility] for scheduled appt. On 08-29-14.   Consulted with EDP Dr.Lockwood who is requesting a SW consult to assist patient with medications concerns as pt is wanted to get started back on her psych meds  But does not have money to pay for her medications. Pt reports that she follows up with [facility] for her medications and opt and was last evaluated last month. Pt reports that she has been off her psych meds for a month because of lack of finances to pay for prescriptions.   Shaune Pollack, MS, [place] Assessment Counselor

## 2014-08-25 NOTE — Progress Notes (Signed)
  CARE MANAGEMENT ED NOTE 08/25/2014  Patient:  Kimberly [name], Kimberly [name]   Account Number:  [id number]  Date Initiated:  08/25/2014  Documentation initiated by:  Edwyna Shell  Subjective/Objective Assessment:   53 yo female presenting to the ED for alcohol detox and medication reills     Subjective/Objective Assessment Detail:     Action/Plan:   Patient stated that she will obtain her medications from [facility] as they are more affordable   Action/Plan Detail:   Anticipated DC Date:       Status Recommendation to Physician:   Result of Recommendation:  Agreed    DC Planning Services  CM consult  Other    Choice offered to / List presented to:            Status of service:  Completed, signed off  ED Comments:   ED Comments Detail:  CM consulted for medication assist. This Cm spoke with the patient and she stated that she has always gone to [facility] for her medication management. She stated that she gets 3 medications there for $6.00. This CM discussed the medication assistance program with the patient and explained that it is $3 copay for each medication. The patient stated that she would rather go to [facility] for her medications because it is cheaper. No further CM needs identified.

## 2014-08-25 NOTE — Discharge Planning (Signed)
[place] Specialist  Spoke to patient regarding primary care resources and establishing care with a provider. Resource guide and my contact information given for any future questions or concerns. No other [place] Specialist needs identified at this time.

## 2015-02-17 ENCOUNTER — Emergency Department (HOSPITAL_COMMUNITY): Payer: Self-pay

## 2015-02-17 ENCOUNTER — Encounter (HOSPITAL_COMMUNITY): Payer: Self-pay | Admitting: Emergency Medicine

## 2015-02-17 ENCOUNTER — Emergency Department (HOSPITAL_COMMUNITY)
Admission: EM | Admit: 2015-02-17 | Discharge: 2015-02-17 | Disposition: A | Discharge status: Home / Self Care | Payer: Self-pay | Attending: Emergency Medicine | Admitting: Emergency Medicine

## 2015-02-17 DIAGNOSIS — Z720 Tobacco use: Secondary | ICD-10-CM | POA: Insufficient documentation

## 2015-02-17 DIAGNOSIS — Z9889 Other specified postprocedural states: Secondary | ICD-10-CM | POA: Insufficient documentation

## 2015-02-17 DIAGNOSIS — Z8639 Personal history of other endocrine, nutritional and metabolic disease: Secondary | ICD-10-CM | POA: Insufficient documentation

## 2015-02-17 DIAGNOSIS — Y998 Other external cause status: Secondary | ICD-10-CM | POA: Insufficient documentation

## 2015-02-17 DIAGNOSIS — Y9289 Other specified places as the place of occurrence of the external cause: Secondary | ICD-10-CM | POA: Insufficient documentation

## 2015-02-17 DIAGNOSIS — S43005A Unspecified dislocation of left shoulder joint, initial encounter: Secondary | ICD-10-CM

## 2015-02-17 DIAGNOSIS — F419 Anxiety disorder, unspecified: Secondary | ICD-10-CM | POA: Insufficient documentation

## 2015-02-17 DIAGNOSIS — W19XXXA Unspecified fall, initial encounter: Secondary | ICD-10-CM

## 2015-02-17 DIAGNOSIS — Y9389 Activity, other specified: Secondary | ICD-10-CM | POA: Insufficient documentation

## 2015-02-17 DIAGNOSIS — F319 Bipolar disorder, unspecified: Secondary | ICD-10-CM | POA: Insufficient documentation

## 2015-02-17 DIAGNOSIS — S43102A Unspecified dislocation of left acromioclavicular joint, initial encounter: Secondary | ICD-10-CM | POA: Insufficient documentation

## 2015-02-17 DIAGNOSIS — Z79899 Other long term (current) drug therapy: Secondary | ICD-10-CM | POA: Insufficient documentation

## 2015-02-17 MED ORDER — HYDROCODONE-ACETAMINOPHEN 5-325 MG PO TABS: 1.0000 | ORAL_TABLET | ORAL | Status: DC | PRN

## 2015-02-17 MED ORDER — FENTANYL CITRATE 0.05 MG/ML IJ SOLN
50.0000 ug | Freq: Once | INTRAMUSCULAR | Status: AC
  Administered 2015-02-17: 50 ug via INTRAVENOUS
  Filled 2015-02-17: qty 2

## 2015-02-17 NOTE — ED Notes (Signed)
MD at bedside. 
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
## 2015-02-17 NOTE — Discharge Instructions (Signed)
Please wear your sling at all times while awake, no weightbearing in the left arm and follow up with the orthopedic doctor in one week.  Acromioclavicular Injuries The AC (acromioclavicular) joint is the joint in the shoulder where the collarbone (clavicle) meets the shoulder blade (scapula). The part of the shoulder blade connected to the collarbone is called the acromion. Common problems with and treatments for the [facility] joint are detailed below. ARTHRITIS Arthritis occurs when the joint has been injured and the smooth padding between the joints (cartilage) is lost. This is the wear and tear seen in most joints of the body if they have been overused. This causes the joint to produce pain and swelling which is worse with activity.  AC JOINT SEPARATION AC joint separation means that the ligaments connecting the acromion of the shoulder blade and collarbone have been damaged, and the two bones no longer line up. AC separations can be anywhere from mild to severe, and are "graded" depending upon which ligaments are torn and how badly they are torn.  Grade I Injury: the least damage is done, and the [facility] joint still lines up.  Grade II Injury: damage to the ligaments which reinforce the [facility] joint. In a Grade II injury, these ligaments are stretched but not entirely torn. When stressed, the [facility] joint becomes painful and unstable.  Grade III Injury: AC and secondary ligaments are completely torn, and the collarbone is no longer attached to the shoulder blade. This results in deformity; a prominence of the end of the clavicle. AC JOINT FRACTURE AC joint fracture means that there has been a break in the bones of the [facility] joint, usually the end of the clavicle. TREATMENT TREATMENT OF AC ARTHRITIS  There is currently no way to replace the cartilage damaged by arthritis. The best way to improve the condition is to decrease the activities which aggravate the problem. Application of ice to the joint helps decrease  pain and soreness (inflammation). The use of non-steroidal anti-inflammatory medication is helpful.  If less conservative measures do not work, then cortisone shots (injections) may be used. These are anti-inflammatories; they decrease the soreness in the joint and swelling.  If non-surgical measures fail, surgery may be recommended. The procedure is generally removal of a portion of the end of the clavicle. This is the part of the collarbone closest to your acromion which is stabilized with ligaments to the acromion of the shoulder blade. This surgery may be performed using a tube-like instrument with a light (arthroscope) for looking into a joint. It may also be performed as an open surgery through a small incision by the surgeon. Most patients will have good range of motion within 6 weeks and may return to all activity including sports by 8-12 weeks, barring complications. TREATMENT OF AN AC SEPARATION  The initial treatment is to decrease pain. This is best accomplished by immobilizing the arm in a sling and placing an ice pack to the shoulder for 20 to 30 minutes every 2 hours as needed. As the pain starts to subside, it is important to begin moving the fingers, wrist, elbow and eventually the shoulder in order to prevent a stiff or "frozen" shoulder. Instruction on when and how much to move the shoulder will be provided by your caregiver. The length of time needed to regain full motion and function depends on the amount or grade of the injury. Recovery from a Grade I AC separation usually takes 10 to 14 days, whereas a Grade III  may take 6 to 8 weeks.  Grade I and II separations usually do not require surgery. Even Grade III injuries usually allow return to full activity with few restrictions. Treatment is also based on the activity demands of the injured shoulder. For example, a high level quarterback with an injured throwing arm will receive more aggressive treatment than someone with a desk job who  rarely uses his/her arm for strenuous activities. In some cases, a painful lump may persist which could require a later surgery. Surgery can be very successful, but the benefits must be weighed against the potential risks. TREATMENT OF AN AC JOINT FRACTURE Fracture treatment depends on the type of fracture. Sometimes a splint or sling may be all that is required. Other times surgery may be required for repair. This is more frequently the case when the ligaments supporting the clavicle are completely torn. Your caregiver will help you with these decisions and together you can decide what will be the best treatment. HOME CARE INSTRUCTIONS   Apply ice to the injury for 15-20 minutes each hour while awake for 2 days. Put the ice in a plastic bag and place a towel between the bag of ice and skin.  If a sling has been applied, wear it constantly for as long as directed by your caregiver, even at night. The sling or splint can be removed for bathing or showering or as directed. Be sure to keep the shoulder in the same place as when the sling is on. Do not lift the arm.  If a figure-of-eight splint has been applied it should be tightened gently by another person every day. Tighten it enough to keep the shoulders held back. Allow enough room to place the index finger between the body and strap. Loosen the splint immediately if there is numbness or tingling in the hands.  Take over-the-counter or prescription medicines for pain, discomfort or fever as directed by your caregiver.  If you or your child has received a follow up appointment, it is very important to keep that appointment in order to avoid long term complications, chronic pain or disability. SEEK MEDICAL CARE IF:   The pain is not relieved with medications.  There is increased swelling or discoloration that continues to get worse rather than better.  You or your child has been unable to follow up as instructed.  There is progressive numbness  and tingling in the arm, forearm or hand. SEEK IMMEDIATE MEDICAL CARE IF:   The arm is numb, cold or pale.  There is increasing pain in the hand, forearm or fingers. MAKE SURE YOU:   Understand these instructions.  Will watch your condition.  Will get help right away if you are not doing well or get worse. Document Released: 08/21/2005 Document Revised: 02/03/2012 Document Reviewed: 02/13/2009 [facility] Patient Information 2015 [place], [place]. This information is not intended to replace advice given to you by your health care provider. Make sure you discuss any questions you have with your health care provider.
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
## 2015-02-23 ENCOUNTER — Other Ambulatory Visit (HOSPITAL_COMMUNITY): Payer: Self-pay | Admitting: Orthopaedic Surgery

## 2015-02-23 NOTE — Progress Notes (Signed)
Called pt for pre-op call, pt states she has already been given instructions. I explained to her that I needed to review her medical history and give her instructions from the anesthesiologist, she states she doesn't have enough minutes on her phone. I then just gave her instructions - time of arrival, NPO after MN, which meds to take in AM, not to wear jewelry, makeup, nail polish, lotions, powders or perfumes. She voiced understanding and hung up.

## 2015-02-23 NOTE — H&P (Signed)
PREOPERATIVE H&P  Chief Complaint: left acromioclavicular separation  HPI: Kimberly [name] is a 54 y.o. female who presents for surgical treatment of left acromioclavicular separation.  She denies any changes in medical history.  Past Medical History  Diagnosis Date  . Depression   . Bipolar 1 disorder   . Anxiety   . Thyroid disease    No past surgical history on file. History   Social History  . Marital Status: Single    Spouse Name: N/A  . Number of Children: N/A  . Years of Education: N/A   Social History Main Topics  . Smoking status: Current Every Day Smoker    Types: Cigarettes  . Smokeless tobacco: Not on file  . Alcohol Use: Yes  . Drug Use: Yes     Comment: Pt denies substance use during this TTS assessment on 08-25-14 UDS + for THC  . Sexual Activity: Not on file   Other Topics Concern  . Not on file   Social History Narrative   No family history on file. No Known Allergies Prior to Admission medications   Medication Sig Start Date End Date Taking? Authorizing Provider  FLUoxetine (PROZAC) 20 MG tablet Take 1 tablet (20 mg total) by mouth daily. 08/25/14  Yes Carmin Muskrat, MD  HYDROcodone-acetaminophen (NORCO/VICODIN) 5-325 MG per tablet Take 1 tablet by mouth every 4 (four) hours as needed. 02/17/15  Yes Jarome Matin, MD  risperiDONE (RISPERDAL) 2 MG tablet Take 1 tablet (2 mg total) by mouth daily. 08/25/14  Yes Carmin Muskrat, MD  traZODone (DESYREL) 50 MG tablet Take 1 tablet (50 mg total) by mouth at bedtime. 08/25/14  Yes Carmin Muskrat, MD     Positive ROS: All other systems have been reviewed and were otherwise negative with the exception of those mentioned in the HPI and as above.  Physical Exam: General: Alert, no acute distress Cardiovascular: No pedal edema Respiratory: No cyanosis, no use of accessory musculature GI: abdomen soft Skin: No lesions in the area of chief complaint Neurologic: Sensation intact distally Psychiatric: Patient  is competent for consent with normal mood and affect Lymphatic: no lymphedema  MUSCULOSKELETAL: exam stable  Assessment: left acromioclavicular separation  Plan: Plan for Procedure(s): LEFT ACROMIO-CLAVICULAR JOINT RECONSTRUCTION  The risks benefits and alternatives were discussed with the patient including but not limited to the risks of nonoperative treatment, versus surgical intervention including infection, bleeding, nerve injury,  blood clots, cardiopulmonary complications, morbidity, mortality, among others, and they were willing to proceed.   Marianna Payment, MD   02/23/2015 10:11 PM

## 2015-02-24 ENCOUNTER — Ambulatory Visit (HOSPITAL_COMMUNITY): Payer: Self-pay | Admitting: Certified Registered"

## 2015-02-24 ENCOUNTER — Ambulatory Visit (HOSPITAL_COMMUNITY): Payer: Self-pay

## 2015-02-24 ENCOUNTER — Encounter (HOSPITAL_COMMUNITY): Payer: Self-pay | Admitting: *Deleted

## 2015-02-24 ENCOUNTER — Encounter (HOSPITAL_COMMUNITY)
Admission: RE | Disposition: A | Discharge status: Home / Self Care | Payer: Self-pay | Source: Ambulatory Visit | Attending: Orthopaedic Surgery

## 2015-02-24 ENCOUNTER — Ambulatory Visit (HOSPITAL_COMMUNITY)
Admission: RE | Admit: 2015-02-24 | Discharge: 2015-02-24 | Disposition: A | Discharge status: Home / Self Care | Payer: Self-pay | Source: Ambulatory Visit | Attending: Orthopaedic Surgery | Admitting: Orthopaedic Surgery

## 2015-02-24 DIAGNOSIS — F329 Major depressive disorder, single episode, unspecified: Secondary | ICD-10-CM | POA: Insufficient documentation

## 2015-02-24 DIAGNOSIS — Z419 Encounter for procedure for purposes other than remedying health state, unspecified: Secondary | ICD-10-CM

## 2015-02-24 DIAGNOSIS — F17210 Nicotine dependence, cigarettes, uncomplicated: Secondary | ICD-10-CM | POA: Insufficient documentation

## 2015-02-24 DIAGNOSIS — M24812 Other specific joint derangements of left shoulder, not elsewhere classified: Secondary | ICD-10-CM | POA: Insufficient documentation

## 2015-02-24 DIAGNOSIS — E079 Disorder of thyroid, unspecified: Secondary | ICD-10-CM | POA: Insufficient documentation

## 2015-02-24 DIAGNOSIS — F419 Anxiety disorder, unspecified: Secondary | ICD-10-CM | POA: Insufficient documentation

## 2015-02-24 HISTORY — PX: ACROMIO-CLAVICULAR JOINT REPAIR: SHX5183

## 2015-02-24 LAB — COMPREHENSIVE METABOLIC PANEL
ALT: 32 U/L (ref 0–35)
AST: 37 U/L (ref 0–37)
Albumin: 3.8 g/dL (ref 3.5–5.2)
Alkaline Phosphatase: 112 U/L (ref 39–117)
Anion gap: 9 (ref 5–15)
BUN: 10 mg/dL (ref 6–23)
CO2: 23 mmol/L (ref 19–32)
Calcium: 9.3 mg/dL (ref 8.4–10.5)
Chloride: 108 mmol/L (ref 96–112)
Creatinine, Ser: 0.6 mg/dL (ref 0.50–1.10)
GFR calc Af Amer: >90 mL/min (ref >90)
GFR calc non Af Amer: >90 mL/min (ref >90)
Glucose, Bld: 85 mg/dL (ref 70–99)
Potassium: 4 mmol/L (ref 3.5–5.1)
Sodium: 140 mmol/L (ref 135–145)
Total Bilirubin: 0.7 mg/dL (ref 0.3–1.2)
Total Protein: 7.6 g/dL (ref 6.0–8.3)

## 2015-02-24 LAB — CBC
HCT: 42.1 % (ref 36.0–46.0)
Hemoglobin: 14.2 g/dL (ref 12.0–15.0)
MCH: 34.7 pg — ABNORMAL HIGH (ref 26.0–34.0)
MCHC: 33.7 g/dL (ref 30.0–36.0)
MCV: 102.9 fL — ABNORMAL HIGH (ref 78.0–100.0)
Platelets: 296 10*3/uL (ref 150–400)
RBC: 4.09 MIL/uL (ref 3.87–5.11)
RDW: 13.4 % (ref 11.5–15.5)
WBC: 13.6 10*3/uL — ABNORMAL HIGH (ref 4.0–10.5)

## 2015-02-24 SURGERY — REPAIR, ACROMIOCLAVICULAR JOINT
Anesthesia: General | Site: Shoulder | Laterality: Left

## 2015-02-24 MED ORDER — HYDROMORPHONE HCL 1 MG/ML IJ SOLN
INTRAMUSCULAR | Status: AC
  Filled 2015-02-24: qty 1

## 2015-02-24 MED ORDER — FENTANYL CITRATE 0.05 MG/ML IJ SOLN
50.0000 ug | INTRAMUSCULAR | Status: DC | PRN
  Administered 2015-02-24: 100 ug via INTRAVENOUS

## 2015-02-24 MED ORDER — EPHEDRINE SULFATE 50 MG/ML IJ SOLN
INTRAMUSCULAR | Status: AC
  Filled 2015-02-24: qty 1

## 2015-02-24 MED ORDER — ARTIFICIAL TEARS OP OINT
TOPICAL_OINTMENT | OPHTHALMIC | Status: AC
  Filled 2015-02-24: qty 3.5

## 2015-02-24 MED ORDER — OXYCODONE HCL 5 MG PO TABS: 5.0000 mg | ORAL_TABLET | ORAL | Status: DC | PRN

## 2015-02-24 MED ORDER — LIDOCAINE HCL (CARDIAC) 20 MG/ML IV SOLN
INTRAVENOUS | Status: AC
  Filled 2015-02-24: qty 5

## 2015-02-24 MED ORDER — MIDAZOLAM HCL 2 MG/2ML IJ SOLN
INTRAMUSCULAR | Status: AC
  Administered 2015-02-24: 2 mg via INTRAVENOUS
  Filled 2015-02-24: qty 2

## 2015-02-24 MED ORDER — OXYCODONE HCL 5 MG PO TABS
ORAL_TABLET | ORAL | Status: AC
  Filled 2015-02-24: qty 1

## 2015-02-24 MED ORDER — SUGAMMADEX SODIUM 200 MG/2ML IV SOLN
INTRAVENOUS | Status: AC
  Filled 2015-02-24: qty 2

## 2015-02-24 MED ORDER — 0.9 % SODIUM CHLORIDE (POUR BTL) OPTIME
TOPICAL | Status: DC | PRN
  Administered 2015-02-24: 1000 mL

## 2015-02-24 MED ORDER — PHENYLEPHRINE HCL 10 MG/ML IJ SOLN
INTRAMUSCULAR | Status: DC | PRN
  Administered 2015-02-24 (×3): 40 ug via INTRAVENOUS
  Administered 2015-02-24 (×2): 80 ug via INTRAVENOUS
  Administered 2015-02-24: 40 ug via INTRAVENOUS

## 2015-02-24 MED ORDER — FENTANYL CITRATE 0.05 MG/ML IJ SOLN
INTRAMUSCULAR | Status: AC
  Administered 2015-02-24: 100 ug via INTRAVENOUS
  Filled 2015-02-24: qty 2

## 2015-02-24 MED ORDER — LACTATED RINGERS IV SOLN
INTRAVENOUS | Status: DC
  Administered 2015-02-24: 09:00:00 via INTRAVENOUS

## 2015-02-24 MED ORDER — MIDAZOLAM HCL 2 MG/2ML IJ SOLN
INTRAMUSCULAR | Status: AC
  Filled 2015-02-24: qty 2

## 2015-02-24 MED ORDER — MIDAZOLAM HCL 2 MG/2ML IJ SOLN
1.0000 mg | INTRAMUSCULAR | Status: DC | PRN
  Administered 2015-02-24: 2 mg via INTRAVENOUS

## 2015-02-24 MED ORDER — ROCURONIUM BROMIDE 50 MG/5ML IV SOLN
INTRAVENOUS | Status: AC
  Filled 2015-02-24: qty 1

## 2015-02-24 MED ORDER — SUGAMMADEX SODIUM 200 MG/2ML IV SOLN
INTRAVENOUS | Status: DC | PRN
  Administered 2015-02-24: 150 mg via INTRAVENOUS

## 2015-02-24 MED ORDER — ONDANSETRON HCL 4 MG/2ML IJ SOLN
INTRAMUSCULAR | Status: DC | PRN
  Administered 2015-02-24: 4 mg via INTRAVENOUS

## 2015-02-24 MED ORDER — SODIUM CHLORIDE 0.9 % IJ SOLN
INTRAMUSCULAR | Status: AC
  Filled 2015-02-24: qty 10

## 2015-02-24 MED ORDER — CEFAZOLIN SODIUM-DEXTROSE 2-3 GM-% IV SOLR
2.0000 g | INTRAVENOUS | Status: AC
  Administered 2015-02-24: 2 g via INTRAVENOUS
  Filled 2015-02-24: qty 50

## 2015-02-24 MED ORDER — PROPOFOL 10 MG/ML IV BOLUS
INTRAVENOUS | Status: AC
  Filled 2015-02-24: qty 20

## 2015-02-24 MED ORDER — ARTIFICIAL TEARS OP OINT
TOPICAL_OINTMENT | OPHTHALMIC | Status: DC | PRN
  Administered 2015-02-24: 1 via OPHTHALMIC

## 2015-02-24 MED ORDER — LACTATED RINGERS IV SOLN
INTRAVENOUS | Status: DC | PRN
  Administered 2015-02-24: 10:00:00 via INTRAVENOUS

## 2015-02-24 MED ORDER — HYDROMORPHONE HCL 1 MG/ML IJ SOLN
0.5000 mg | INTRAMUSCULAR | Status: DC | PRN
  Administered 2015-02-24 (×3): 0.5 mg via INTRAVENOUS

## 2015-02-24 MED ORDER — PHENYLEPHRINE 40 MCG/ML (10ML) SYRINGE FOR IV PUSH (FOR BLOOD PRESSURE SUPPORT)
PREFILLED_SYRINGE | INTRAVENOUS | Status: AC
  Filled 2015-02-24: qty 10

## 2015-02-24 MED ORDER — FENTANYL CITRATE 0.05 MG/ML IJ SOLN
INTRAMUSCULAR | Status: DC | PRN
  Administered 2015-02-24: 50 ug via INTRAVENOUS

## 2015-02-24 MED ORDER — ROCURONIUM BROMIDE 100 MG/10ML IV SOLN
INTRAVENOUS | Status: DC | PRN
  Administered 2015-02-24: 40 mg via INTRAVENOUS

## 2015-02-24 MED ORDER — LIDOCAINE HCL (CARDIAC) 20 MG/ML IV SOLN
INTRAVENOUS | Status: DC | PRN
  Administered 2015-02-24: 60 mg via INTRAVENOUS

## 2015-02-24 MED ORDER — OXYCODONE HCL 5 MG PO TABS
5.0000 mg | ORAL_TABLET | Freq: Once | ORAL | Status: AC
  Administered 2015-02-24: 5 mg via ORAL

## 2015-02-24 MED ORDER — PROPOFOL 10 MG/ML IV BOLUS
INTRAVENOUS | Status: DC | PRN
  Administered 2015-02-24: 80 mg via INTRAVENOUS
  Administered 2015-02-24: 120 mg via INTRAVENOUS

## 2015-02-24 MED ORDER — FENTANYL CITRATE 0.05 MG/ML IJ SOLN
INTRAMUSCULAR | Status: AC
  Filled 2015-02-24: qty 5

## 2015-02-24 SURGICAL SUPPLY — 48 items
BUTTON DOGBONE (Orthopedic Implant) ×6 IMPLANT
CLOSURE WOUND 1/2 X4 (GAUZE/BANDAGES/DRESSINGS) ×1
COVER SURGICAL LIGHT HANDLE (MISCELLANEOUS) ×3 IMPLANT
DERMABOND ADVANCED (GAUZE/BANDAGES/DRESSINGS) ×2
DERMABOND ADVANCED .7 DNX12 (GAUZE/BANDAGES/DRESSINGS) ×1 IMPLANT
DRAPE C-ARM 42X72 X-RAY (DRAPES) ×3 IMPLANT
DRAPE IMP U-DRAPE 54X76 (DRAPES) ×3 IMPLANT
DRAPE INCISE IOBAN 66X45 STRL (DRAPES) IMPLANT
DRAPE PROXIMA HALF (DRAPES) ×3 IMPLANT
DRAPE SURG 17X23 STRL (DRAPES) ×3 IMPLANT
DRAPE U-SHAPE 47X51 STRL (DRAPES) ×3 IMPLANT
DRSG MEPILEX BORDER 4X8 (GAUZE/BANDAGES/DRESSINGS) ×3 IMPLANT
DRSG TEGADERM 4X4.75 (GAUZE/BANDAGES/DRESSINGS) IMPLANT
DURAPREP 26ML APPLICATOR (WOUND CARE) ×3 IMPLANT
ELECT CAUTERY BLADE 6.4 (BLADE) ×3 IMPLANT
ELECT REM PT RETURN 9FT ADLT (ELECTROSURGICAL) ×3
ELECTRODE REM PT RTRN 9FT ADLT (ELECTROSURGICAL) ×1 IMPLANT
FACESHIELD WRAPAROUND (MASK) IMPLANT
FIBERSTICK 2 (SUTURE) ×3 IMPLANT
GAUZE XEROFORM 1X8 LF (GAUZE/BANDAGES/DRESSINGS) IMPLANT
GLOVE NEODERM STRL 7.5 LF PF (GLOVE) ×1 IMPLANT
GLOVE SURG NEODERM 7.5  LF PF (GLOVE) ×2
GOWN STRL REIN XL XLG (GOWN DISPOSABLE) ×3 IMPLANT
GUIDE PIN REFRO (PIN) ×3 IMPLANT
KIT BASIN OR (CUSTOM PROCEDURE TRAY) ×3 IMPLANT
KIT REPAIR AC JOINT TIGHT (Orthopedic Implant) IMPLANT
KIT ROOM TURNOVER OR (KITS) ×3 IMPLANT
LOOP FIBERTAPE 2MM 8 WHT BLK (VASCULAR PRODUCTS) ×6 IMPLANT
MANIFOLD NEPTUNE II (INSTRUMENTS) ×3 IMPLANT
NEEDLE 1/2 CIR MAYO (NEEDLE) ×3 IMPLANT
NEEDLE HYPO 25GX1X1/2 BEV (NEEDLE) IMPLANT
NS IRRIG 1000ML POUR BTL (IV SOLUTION) ×3 IMPLANT
PACK SHOULDER (CUSTOM PROCEDURE TRAY) ×3 IMPLANT
PACK UNIVERSAL I (CUSTOM PROCEDURE TRAY) ×3 IMPLANT
PAD ARMBOARD 7.5X6 YLW CONV (MISCELLANEOUS) ×6 IMPLANT
SLING ARM FOAM STRAP LRG (SOFTGOODS) ×3 IMPLANT
SPONGE LAP 18X18 X RAY DECT (DISPOSABLE) ×3 IMPLANT
STRIP CLOSURE SKIN 1/2X4 (GAUZE/BANDAGES/DRESSINGS) ×2 IMPLANT
SUCTION FRAZIER TIP 10 FR DISP (SUCTIONS) ×3 IMPLANT
SUT ETHILON 3 0 PS 1 (SUTURE) IMPLANT
SUT MNCRL AB 4-0 PS2 18 (SUTURE) ×3 IMPLANT
SUT PROLENE 3 0 PS 1 (SUTURE) IMPLANT
SUT VIC AB 2-0 CT1 27 (SUTURE) ×2
SUT VIC AB 2-0 CT1 TAPERPNT 27 (SUTURE) ×1 IMPLANT
SUT VICRYL 0 CT 1 36IN (SUTURE) ×3 IMPLANT
SYR CONTROL 10ML LL (SYRINGE) IMPLANT
WATER STERILE IRR 1000ML POUR (IV SOLUTION) IMPLANT
YANKAUER SUCT BULB TIP NO VENT (SUCTIONS) IMPLANT

## 2015-02-24 NOTE — OR Nursing (Signed)
Dilaudid .5 mg wasted with [facility] representative.

## 2015-02-24 NOTE — Anesthesia Preprocedure Evaluation (Signed)
Anesthesia Evaluation    Airway Mallampati: I  TM Distance: >3 FB Neck ROM: Full    Dental  (+) Missing, Poor Dentition, Dental Advisory Given   Pulmonary Current Smoker,          Cardiovascular     Neuro/Psych    GI/Hepatic   Endo/Other    Renal/GU      Musculoskeletal   Abdominal   Peds  Hematology   Anesthesia Other Findings   Reproductive/Obstetrics                             Anesthesia Physical Anesthesia Plan  ASA: III  Anesthesia Plan:    Post-op Pain Management:    Induction:   Airway Management Planned:   Additional Equipment:   Intra-op Plan:   Post-operative Plan:   Informed Consent:   Plan Discussed with:   Anesthesia Plan Comments:         Anesthesia Quick Evaluation

## 2015-02-24 NOTE — Anesthesia Procedure Notes (Signed)
Procedure Name: Intubation Date/Time: 02/24/2015 10:16 AM Performed by: Gaylene Brooks Pre-anesthesia Checklist: Timeout performed, Patient being monitored, Suction available, Emergency Drugs available and Patient identified Patient Re-evaluated:Patient Re-evaluated prior to inductionOxygen Delivery Method: Circle system utilized Preoxygenation: Pre-oxygenation with 100% oxygen Intubation Type: IV induction Ventilation: Mask ventilation without difficulty Laryngoscope Size: Mac and 3 Grade View: Grade I Tube type: Oral Tube size: 7.0 mm Number of attempts: 2 Airway Equipment and Method: Stylet Placement Confirmation: ETT inserted through vocal cords under direct vision,  positive ETCO2,  CO2 detector and breath sounds checked- equal and bilateral Secured at: 22 cm Tube secured with: Tape Dental Injury: Teeth and Oropharynx as per pre-operative assessment  Comments: DL with Miller 2. Gr 1 view, but couldn't get ETT passed large protruding tooth. DL with Mac 3. Gr 1 view. ETT easily passed.

## 2015-02-24 NOTE — Anesthesia Postprocedure Evaluation (Signed)
  Anesthesia Post-op Note  Patient: Kimberly [name]  Procedure(s) Performed: Procedure(s): LEFT ACROMIO-CLAVICULAR JOINT RECONSTRUCTION (Left)  Patient Location: PACU  Anesthesia Type:General and GA combined with regional for post-op pain  Level of Consciousness: awake, alert  and oriented  Airway and Oxygen Therapy: Patient Spontanous Breathing and Patient connected to nasal cannula oxygen  Post-op Pain: mild  Post-op Assessment: Post-op Vital signs reviewed, Patient's Cardiovascular Status Stable, Respiratory Function Stable, Patent Airway and Pain level controlled  Post-op Vital Signs: stable  Last Vitals:  Filed Vitals:   02/24/15 1300  BP: 138/85  Pulse: 76  Temp:   Resp: 14    Complications: No apparent anesthesia complications

## 2015-02-24 NOTE — Transfer of Care (Signed)
Immediate Anesthesia Transfer of Care Note  Patient: Kimberly [name]  Procedure(s) Performed: Procedure(s): LEFT ACROMIO-CLAVICULAR JOINT RECONSTRUCTION (Left)  Patient Location: PACU  Anesthesia Type:General  Level of Consciousness: awake, alert  and oriented  Airway & Oxygen Therapy: Patient Spontanous Breathing and Patient connected to nasal cannula oxygen  Post-op Assessment: Report given to RN, Post -op Vital signs reviewed and stable and Patient moving all extremities X 4  Post vital signs: Reviewed and stable  Last Vitals:  Filed Vitals:   02/24/15 1000  BP: 109/67  Pulse: 76  Temp:   Resp: 20    Complications: No apparent anesthesia complications

## 2015-02-24 NOTE — Op Note (Signed)
Date of surgery: 02/24/2015  Preoperative diagnosis: Left grade 5 acromioclavicular separation, acute  Postoperative diagnosis: Same  Procedure: Left acromioclavicular joint repair  Implants: Arthrex dog bone and fiber tape sutures  Surgeon: Eduard Roux, M.D.  Anesthesia: General and regional  Estimated blood loss: 50 mL  Complications: None next  Condition to PACU: Stable  Indications for procedure: [name] is a 54 year old female who sustained a left grade 5 acromioclavicular separation. I saw her in clinic yesterday and there was evidence of skin tenting. The decision was to proceed with surgery as soon as possible. She was aware of the risks, benefits, and alternatives to surgery. She wished to proceed. Next  Description of procedure: The patient was identified in preoperative holding area. The operative site was marked by the surgeon and confirmed with the patient. She was brought back to the operating room. She was placed supine on table. General anesthesia was induced. She was then placed in the beachchair position. The left upper extremity was prepped and draped in standard sterile fashion. All bony prominences were well-padded. Timeout was performed. Antibiotics were given. We made a longitudinal incision from the distal clavicle down to the coracoid. Bun dissection was taken down to the level of the clavipectoral fascia. The clavipectoral fascia was incised horizontally. This was elevated off of the distal clavicle. Blunt dissection was also then taken down to the coracoid. Once we felt we had adequate exposure we drilled two 3 mm drill holes 1 through the distal clavicle and one through the center of the coracoid. We then advanced the suture tapes with the dog bone inferior to the coracoid. Then we passed the sutures up to the coracoid and up through the distal clavicle. We then reduced the before meals joint. This was confirmed under fluoroscopy. With the before meals joint  reduced we tied the suture tapes with a dog bone over the superior aspect of the clavicle. The wound was then thoroughly irrigated and closed in layer fashion using #2 Ethibond for the CA ligament, 0 Vicryl for the fascia, 2-0 Vicryl for the subcutaneous layer, 4-0 Monocryl for the skin. Dermabond and Steri-Strips were applied. Sterile dressing was applied. Patient was placed in a sling. Next  Postoperative plan: Patient will be nonweightbearing to the left upper extremity. She is to wear the sling for comfort. We will see her back in the office in 2 weeks.  Azucena Cecil, MD [place] 11:43 AM

## 2015-02-24 NOTE — Discharge Instructions (Signed)
1. Wear sling at all times 2. Increase activity as tolerated 3. Keep incision covered when showering 4. Do not lift anything with the left arm

## 2015-02-27 ENCOUNTER — Encounter (HOSPITAL_COMMUNITY): Payer: Self-pay | Admitting: Orthopaedic Surgery

## 2016-03-21 ENCOUNTER — Encounter (HOSPITAL_COMMUNITY): Payer: Self-pay | Admitting: Emergency Medicine

## 2016-03-21 ENCOUNTER — Inpatient Hospital Stay (HOSPITAL_COMMUNITY)
Admission: EM | Admit: 2016-03-21 | Discharge: 2016-03-26 | DRG: 132 | Disposition: A | Discharge status: Home / Self Care | Payer: Self-pay | Attending: Internal Medicine | Admitting: Internal Medicine

## 2016-03-21 ENCOUNTER — Emergency Department (HOSPITAL_COMMUNITY): Payer: Self-pay

## 2016-03-21 DIAGNOSIS — S02641A Fracture of ramus of right mandible, initial encounter for closed fracture: Principal | ICD-10-CM | POA: Diagnosis present

## 2016-03-21 DIAGNOSIS — F319 Bipolar disorder, unspecified: Secondary | ICD-10-CM | POA: Diagnosis present

## 2016-03-21 DIAGNOSIS — Y92009 Unspecified place in unspecified non-institutional (private) residence as the place of occurrence of the external cause: Secondary | ICD-10-CM

## 2016-03-21 DIAGNOSIS — S02609A Fracture of mandible, unspecified, initial encounter for closed fracture: Secondary | ICD-10-CM | POA: Diagnosis present

## 2016-03-21 DIAGNOSIS — F10129 Alcohol abuse with intoxication, unspecified: Secondary | ICD-10-CM | POA: Diagnosis present

## 2016-03-21 DIAGNOSIS — F1010 Alcohol abuse, uncomplicated: Secondary | ICD-10-CM

## 2016-03-21 DIAGNOSIS — R0989 Other specified symptoms and signs involving the circulatory and respiratory systems: Secondary | ICD-10-CM

## 2016-03-21 DIAGNOSIS — F1011 Alcohol abuse, in remission: Secondary | ICD-10-CM | POA: Diagnosis present

## 2016-03-21 DIAGNOSIS — Y905 Blood alcohol level of 100-119 mg/100 ml: Secondary | ICD-10-CM | POA: Diagnosis present

## 2016-03-21 DIAGNOSIS — F17210 Nicotine dependence, cigarettes, uncomplicated: Secondary | ICD-10-CM | POA: Diagnosis present

## 2016-03-21 DIAGNOSIS — Z79899 Other long term (current) drug therapy: Secondary | ICD-10-CM

## 2016-03-21 DIAGNOSIS — S0121XA Laceration without foreign body of nose, initial encounter: Secondary | ICD-10-CM | POA: Diagnosis present

## 2016-03-21 LAB — I-STAT CHEM 8, ED
BUN: 11 mg/dL (ref 6–20)
Calcium, Ion: 1.09 mmol/L — ABNORMAL LOW (ref 1.12–1.23)
Chloride: 110 mmol/L (ref 101–111)
Creatinine, Ser: 0.6 mg/dL (ref 0.44–1.00)
Glucose, Bld: 83 mg/dL (ref 65–99)
HCT: 46 % (ref 36.0–46.0)
Hemoglobin: 15.6 g/dL — ABNORMAL HIGH (ref 12.0–15.0)
Potassium: 3.7 mmol/L (ref 3.5–5.1)
Sodium: 144 mmol/L (ref 135–145)
TCO2: 19 mmol/L (ref 0–100)

## 2016-03-21 LAB — CBC WITH DIFFERENTIAL/PLATELET
Basophils Absolute: 0 10*3/uL (ref 0.0–0.1)
Basophils Relative: 0 %
Eosinophils Absolute: 0.4 10*3/uL (ref 0.0–0.7)
Eosinophils Relative: 3 %
HCT: 40.1 % (ref 36.0–46.0)
Hemoglobin: 13.9 g/dL (ref 12.0–15.0)
Lymphocytes Relative: 33 %
Lymphs Abs: 4.3 10*3/uL — ABNORMAL HIGH (ref 0.7–4.0)
MCH: 34.2 pg — ABNORMAL HIGH (ref 26.0–34.0)
MCHC: 34.7 g/dL (ref 30.0–36.0)
MCV: 98.5 fL (ref 78.0–100.0)
Monocytes Absolute: 0.9 10*3/uL (ref 0.1–1.0)
Monocytes Relative: 7 %
Neutro Abs: 7.5 10*3/uL (ref 1.7–7.7)
Neutrophils Relative %: 57 %
Platelets: 251 10*3/uL (ref 150–400)
RBC: 4.07 MIL/uL (ref 3.87–5.11)
RDW: 13.2 % (ref 11.5–15.5)
WBC: 13 10*3/uL — ABNORMAL HIGH (ref 4.0–10.5)

## 2016-03-21 LAB — ETHANOL: Alcohol, Ethyl (B): 113 mg/dL — ABNORMAL HIGH (ref <5)

## 2016-03-21 MED ORDER — FLUOXETINE HCL 20 MG PO CAPS
20.0000 mg | ORAL_CAPSULE | Freq: Every day | ORAL | Status: DC
  Administered 2016-03-21 – 2016-03-25 (×3): 20 mg via ORAL
  Filled 2016-03-21 (×5): qty 1

## 2016-03-21 MED ORDER — SODIUM CHLORIDE 0.9 % IV BOLUS (SEPSIS)
1000.0000 mL | Freq: Once | INTRAVENOUS | Status: AC
  Administered 2016-03-21: 1000 mL via INTRAVENOUS

## 2016-03-21 MED ORDER — LORAZEPAM 2 MG/ML IJ SOLN
1.0000 mg | Freq: Four times a day (QID) | INTRAMUSCULAR | Status: AC | PRN
  Filled 2016-03-21: qty 1

## 2016-03-21 MED ORDER — ONDANSETRON HCL 4 MG/2ML IJ SOLN
4.0000 mg | Freq: Four times a day (QID) | INTRAMUSCULAR | Status: DC | PRN
  Administered 2016-03-21: 4 mg via INTRAVENOUS
  Filled 2016-03-21 (×2): qty 2

## 2016-03-21 MED ORDER — SODIUM CHLORIDE 0.9 % IV SOLN
INTRAVENOUS | Status: DC
  Administered 2016-03-21 – 2016-03-26 (×9): via INTRAVENOUS

## 2016-03-21 MED ORDER — ACETAMINOPHEN 325 MG PO TABS
650.0000 mg | ORAL_TABLET | Freq: Four times a day (QID) | ORAL | Status: DC | PRN
  Administered 2016-03-23: 650 mg via ORAL
  Filled 2016-03-21: qty 2

## 2016-03-21 MED ORDER — ACETAMINOPHEN 650 MG RE SUPP: 650.0000 mg | Freq: Four times a day (QID) | RECTAL | Status: DC | PRN

## 2016-03-21 MED ORDER — OXYCODONE HCL 5 MG PO TABS
5.0000 mg | ORAL_TABLET | ORAL | Status: DC | PRN
  Administered 2016-03-21 – 2016-03-24 (×5): 5 mg via ORAL
  Filled 2016-03-21 (×5): qty 1

## 2016-03-21 MED ORDER — RISPERIDONE 2 MG PO TABS
2.0000 mg | ORAL_TABLET | Freq: Every day | ORAL | Status: DC
  Administered 2016-03-21 – 2016-03-25 (×3): 2 mg via ORAL
  Filled 2016-03-21 (×6): qty 1

## 2016-03-21 MED ORDER — LORAZEPAM 1 MG PO TABS: 1.0000 mg | ORAL_TABLET | Freq: Four times a day (QID) | ORAL | Status: AC | PRN

## 2016-03-21 MED ORDER — MORPHINE SULFATE (PF) 4 MG/ML IV SOLN
4.0000 mg | Freq: Once | INTRAVENOUS | Status: AC
  Administered 2016-03-21: 4 mg via INTRAVENOUS
  Filled 2016-03-21: qty 1

## 2016-03-21 MED ORDER — FOLIC ACID 1 MG PO TABS
1.0000 mg | ORAL_TABLET | Freq: Every day | ORAL | Status: DC
  Administered 2016-03-21 – 2016-03-25 (×3): 1 mg via ORAL
  Filled 2016-03-21 (×3): qty 1

## 2016-03-21 MED ORDER — ADULT MULTIVITAMIN W/MINERALS CH
1.0000 | ORAL_TABLET | Freq: Every day | ORAL | Status: DC
  Administered 2016-03-21 – 2016-03-25 (×3): 1 via ORAL
  Filled 2016-03-21 (×3): qty 1

## 2016-03-21 MED ORDER — TRAZODONE HCL 50 MG PO TABS
50.0000 mg | ORAL_TABLET | Freq: Every day | ORAL | Status: DC
  Administered 2016-03-21: 50 mg via ORAL
  Filled 2016-03-21: qty 1

## 2016-03-21 MED ORDER — LORAZEPAM 2 MG/ML IJ SOLN
0.0000 mg | Freq: Two times a day (BID) | INTRAMUSCULAR | Status: AC
  Administered 2016-03-23: 2 mg via INTRAVENOUS
  Administered 2016-03-23: 1 mg via INTRAVENOUS
  Filled 2016-03-21: qty 1

## 2016-03-21 MED ORDER — ONDANSETRON HCL 4 MG PO TABS
4.0000 mg | ORAL_TABLET | Freq: Four times a day (QID) | ORAL | Status: DC | PRN
  Administered 2016-03-21: 4 mg via ORAL
  Filled 2016-03-21: qty 1

## 2016-03-21 MED ORDER — MORPHINE SULFATE (PF) 2 MG/ML IV SOLN
2.0000 mg | INTRAVENOUS | Status: DC | PRN
  Administered 2016-03-21 – 2016-03-24 (×9): 2 mg via INTRAVENOUS
  Filled 2016-03-21 (×9): qty 1

## 2016-03-21 MED ORDER — LORAZEPAM 2 MG/ML IJ SOLN
0.0000 mg | Freq: Four times a day (QID) | INTRAMUSCULAR | Status: AC
  Administered 2016-03-22 – 2016-03-23 (×3): 2 mg via INTRAVENOUS
  Filled 2016-03-21 (×3): qty 1

## 2016-03-21 MED ORDER — THIAMINE HCL 100 MG/ML IJ SOLN
100.0000 mg | Freq: Every day | INTRAMUSCULAR | Status: DC
  Administered 2016-03-23 – 2016-03-24 (×2): 100 mg via INTRAVENOUS
  Filled 2016-03-21 (×3): qty 2

## 2016-03-21 MED ORDER — VITAMIN B-1 100 MG PO TABS
100.0000 mg | ORAL_TABLET | Freq: Every day | ORAL | Status: DC
  Administered 2016-03-21 – 2016-03-25 (×2): 100 mg via ORAL
  Filled 2016-03-21 (×3): qty 1

## 2016-03-21 NOTE — ED Notes (Signed)
Per EDP, Pt will be admitted to [facility] w/ surgery pended for tomorrow.
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
## 2016-03-21 NOTE — ED Notes (Signed)
Pt removed c-collar and refuses to place back on

## 2016-03-21 NOTE — ED Notes (Signed)
Hospitalist at bedside 

## 2016-03-21 NOTE — ED Notes (Signed)
C-Collar applied to pt; pt moved to Res B for better visualization
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
## 2016-03-21 NOTE — Progress Notes (Signed)
Pt arrived via EMS to floor from [facility] ED. Assessment and VS per flowsheets. Pt withdrawn/not interested in conversing. Able to do most of her admission with brief answers from pt. Pt very tired as she was up all night she states. Pain meds given per [facility] for jaw pain. Pt able to swallow pills with water without any issues.
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
## 2016-03-21 NOTE — ED Notes (Addendum)
Pt asked to urinate prior to transport and sts she "just went."  No staff witnessed Pt walking to restroom.
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
## 2016-03-21 NOTE — ED Notes (Signed)
All belongings given to [facility].

## 2016-03-22 ENCOUNTER — Encounter (HOSPITAL_COMMUNITY): Payer: Self-pay | Admitting: Certified Registered Nurse Anesthetist

## 2016-03-22 ENCOUNTER — Inpatient Hospital Stay (HOSPITAL_COMMUNITY): Payer: Self-pay | Admitting: Certified Registered Nurse Anesthetist

## 2016-03-22 ENCOUNTER — Encounter (HOSPITAL_COMMUNITY)
Admission: EM | Disposition: A | Discharge status: Home / Self Care | Payer: Self-pay | Source: Home / Self Care | Attending: Internal Medicine

## 2016-03-22 DIAGNOSIS — S02609A Fracture of mandible, unspecified, initial encounter for closed fracture: Secondary | ICD-10-CM

## 2016-03-22 DIAGNOSIS — F1010 Alcohol abuse, uncomplicated: Secondary | ICD-10-CM

## 2016-03-22 HISTORY — PX: ORIF MANDIBULAR FRACTURE: SHX2127

## 2016-03-22 LAB — BASIC METABOLIC PANEL
Anion gap: 8 (ref 5–15)
BUN: 8 mg/dL (ref 6–20)
CO2: 22 mmol/L (ref 22–32)
Calcium: 8.7 mg/dL — ABNORMAL LOW (ref 8.9–10.3)
Chloride: 109 mmol/L (ref 101–111)
Creatinine, Ser: 0.65 mg/dL (ref 0.44–1.00)
GFR calc Af Amer: >60 mL/min (ref >60)
GFR calc non Af Amer: >60 mL/min (ref >60)
Glucose, Bld: 94 mg/dL (ref 65–99)
Potassium: 3.7 mmol/L (ref 3.5–5.1)
Sodium: 139 mmol/L (ref 135–145)

## 2016-03-22 LAB — SURGICAL PCR SCREEN
MRSA, PCR: NEGATIVE
Staphylococcus aureus: POSITIVE — AB

## 2016-03-22 LAB — CBC
HCT: 36.3 % (ref 36.0–46.0)
Hemoglobin: 11.9 g/dL — ABNORMAL LOW (ref 12.0–15.0)
MCH: 33 pg (ref 26.0–34.0)
MCHC: 32.8 g/dL (ref 30.0–36.0)
MCV: 100.6 fL — ABNORMAL HIGH (ref 78.0–100.0)
Platelets: 237 10*3/uL (ref 150–400)
RBC: 3.61 MIL/uL — ABNORMAL LOW (ref 3.87–5.11)
RDW: 13.1 % (ref 11.5–15.5)
WBC: 9.7 10*3/uL (ref 4.0–10.5)

## 2016-03-22 SURGERY — OPEN REDUCTION INTERNAL FIXATION (ORIF) MANDIBULAR FRACTURE
Anesthesia: General

## 2016-03-22 MED ORDER — FENTANYL CITRATE (PF) 250 MCG/5ML IJ SOLN
INTRAMUSCULAR | Status: AC
  Filled 2016-03-22: qty 5

## 2016-03-22 MED ORDER — LIDOCAINE-EPINEPHRINE 1 %-1:100000 IJ SOLN
INTRAMUSCULAR | Status: DC | PRN
  Administered 2016-03-22: 3 mL

## 2016-03-22 MED ORDER — FENTANYL CITRATE (PF) 100 MCG/2ML IJ SOLN
INTRAMUSCULAR | Status: DC | PRN
  Administered 2016-03-22 (×8): 50 ug via INTRAVENOUS

## 2016-03-22 MED ORDER — OXYMETAZOLINE HCL 0.05 % NA SOLN
NASAL | Status: AC
  Filled 2016-03-22: qty 15

## 2016-03-22 MED ORDER — SUGAMMADEX SODIUM 500 MG/5ML IV SOLN
INTRAVENOUS | Status: DC | PRN
  Administered 2016-03-22: 140 mg via INTRAVENOUS

## 2016-03-22 MED ORDER — MIDAZOLAM HCL 5 MG/5ML IJ SOLN
INTRAMUSCULAR | Status: DC | PRN
  Administered 2016-03-22: 2 mg via INTRAVENOUS

## 2016-03-22 MED ORDER — OXYMETAZOLINE HCL 0.05 % NA SOLN
NASAL | Status: DC | PRN
  Administered 2016-03-22: 1 via NASAL

## 2016-03-22 MED ORDER — LACTATED RINGERS IV SOLN
INTRAVENOUS | Status: DC
  Administered 2016-03-22 (×2): via INTRAVENOUS

## 2016-03-22 MED ORDER — LIDOCAINE HCL (CARDIAC) 20 MG/ML IV SOLN
INTRAVENOUS | Status: DC | PRN
  Administered 2016-03-22: 60 mg via INTRAVENOUS

## 2016-03-22 MED ORDER — CLINDAMYCIN PHOSPHATE 600 MG/50ML IV SOLN
600.0000 mg | INTRAVENOUS | Status: AC
  Administered 2016-03-22: 600 mg via INTRAVENOUS
  Filled 2016-03-22: qty 50

## 2016-03-22 MED ORDER — TRAZODONE HCL 100 MG PO TABS
100.0000 mg | ORAL_TABLET | Freq: Every day | ORAL | Status: DC
  Administered 2016-03-22 – 2016-03-25 (×4): 100 mg via ORAL
  Filled 2016-03-22 (×4): qty 1

## 2016-03-22 MED ORDER — FENTANYL CITRATE (PF) 100 MCG/2ML IJ SOLN
INTRAMUSCULAR | Status: AC
  Administered 2016-03-22: 25 ug via INTRAVENOUS
  Filled 2016-03-22: qty 2

## 2016-03-22 MED ORDER — ROCURONIUM BROMIDE 100 MG/10ML IV SOLN
INTRAVENOUS | Status: DC | PRN
Start: 2016-03-22 — End: 2016-03-22
  Administered 2016-03-22: 50 mg via INTRAVENOUS

## 2016-03-22 MED ORDER — EPINEPHRINE HCL (NASAL) 0.1 % NA SOLN
NASAL | Status: AC
  Filled 2016-03-22: qty 30

## 2016-03-22 MED ORDER — FENTANYL CITRATE (PF) 100 MCG/2ML IJ SOLN
25.0000 ug | INTRAMUSCULAR | Status: DC | PRN
  Administered 2016-03-22 (×4): 25 ug via INTRAVENOUS

## 2016-03-22 MED ORDER — BACIT-POLY-NEO HC 1 % EX OINT
TOPICAL_OINTMENT | CUTANEOUS | Status: AC
  Filled 2016-03-22: qty 15

## 2016-03-22 MED ORDER — PHENYLEPHRINE HCL 10 MG/ML IJ SOLN
INTRAMUSCULAR | Status: DC | PRN
  Administered 2016-03-22 (×4): 80 ug via INTRAVENOUS

## 2016-03-22 MED ORDER — PROPOFOL 10 MG/ML IV BOLUS
INTRAVENOUS | Status: DC | PRN
  Administered 2016-03-22: 150 mg via INTRAVENOUS

## 2016-03-22 MED ORDER — ONDANSETRON HCL 4 MG/2ML IJ SOLN
INTRAMUSCULAR | Status: DC | PRN
  Administered 2016-03-22: 4 mg via INTRAVENOUS

## 2016-03-22 MED ORDER — LIDOCAINE HCL 2 % EX GEL
CUTANEOUS | Status: DC | PRN
  Administered 2016-03-22: 1 via INTRATRACHEAL

## 2016-03-22 MED ORDER — LIDOCAINE-EPINEPHRINE 1 %-1:100000 IJ SOLN
INTRAMUSCULAR | Status: AC
  Filled 2016-03-22: qty 1

## 2016-03-22 MED ORDER — 0.9 % SODIUM CHLORIDE (POUR BTL) OPTIME
TOPICAL | Status: DC | PRN
  Administered 2016-03-22: 1000 mL

## 2016-03-22 MED ORDER — MIDAZOLAM HCL 2 MG/2ML IJ SOLN
INTRAMUSCULAR | Status: AC
  Filled 2016-03-22: qty 2

## 2016-03-22 MED ORDER — CLINDAMYCIN PHOSPHATE 600 MG/50ML IV SOLN
600.0000 mg | Freq: Four times a day (QID) | INTRAVENOUS | Status: DC
  Administered 2016-03-22 – 2016-03-26 (×15): 600 mg via INTRAVENOUS
  Filled 2016-03-22 (×18): qty 50

## 2016-03-22 MED ORDER — OXYMETAZOLINE HCL 0.05 % NA SOLN
NASAL | Status: DC | PRN
  Administered 2016-03-22: 1 via TOPICAL

## 2016-03-22 SURGICAL SUPPLY — 44 items
BLADE 10 SAFETY STRL DISP (BLADE) ×3 IMPLANT
BLADE SURG 15 STRL LF DISP TIS (BLADE) IMPLANT
BLADE SURG 15 STRL SS (BLADE)
CANISTER SUCTION 2500CC (MISCELLANEOUS) ×3 IMPLANT
CLEANER TIP ELECTROSURG 2X2 (MISCELLANEOUS) ×3 IMPLANT
CONFORMERS SILICONE 5649 (OPHTHALMIC RELATED) IMPLANT
COVER SURGICAL LIGHT HANDLE (MISCELLANEOUS) ×3 IMPLANT
CRADLE DONUT ADULT HEAD (MISCELLANEOUS) ×3 IMPLANT
DECANTER SPIKE VIAL GLASS SM (MISCELLANEOUS) ×3 IMPLANT
DRAPE PROXIMA HALF (DRAPES) IMPLANT
ELECT COATED BLADE 2.86 ST (ELECTRODE) ×3 IMPLANT
ELECT NEEDLE BLADE 2-5/6 (NEEDLE) ×3 IMPLANT
ELECT REM PT RETURN 9FT ADLT (ELECTROSURGICAL) ×3
ELECTRODE REM PT RTRN 9FT ADLT (ELECTROSURGICAL) ×1 IMPLANT
GLOVE ECLIPSE 7.5 STRL STRAW (GLOVE) ×6 IMPLANT
GLOVE SURG SS PI 7.5 STRL IVOR (GLOVE) ×3 IMPLANT
GLOVE SURG SS PI 8.0 STRL IVOR (GLOVE) ×6 IMPLANT
GOWN STRL REUS W/ TWL LRG LVL3 (GOWN DISPOSABLE) ×2 IMPLANT
GOWN STRL REUS W/TWL LRG LVL3 (GOWN DISPOSABLE) ×4
KIT BASIN OR (CUSTOM PROCEDURE TRAY) ×3 IMPLANT
KIT ROOM TURNOVER OR (KITS) ×3 IMPLANT
NEEDLE HYPO 25GX1X1/2 BEV (NEEDLE) IMPLANT
NS IRRIG 1000ML POUR BTL (IV SOLUTION) ×3 IMPLANT
PAD ARMBOARD 7.5X6 YLW CONV (MISCELLANEOUS) ×3 IMPLANT
PATTIES SURGICAL .5 X3 (DISPOSABLE) ×3 IMPLANT
PENCIL FOOT CONTROL (ELECTRODE) ×3 IMPLANT
PLATE MNDBLE MINI 4H (Plate) ×3 IMPLANT
PLATE MNDBLE STR W/BAR 6H (Plate) ×3 IMPLANT
SCREW LOCKING 2.0X5MM (Screw) ×15 IMPLANT
SCREW LOCKING 2.3X6MM (Screw) ×3 IMPLANT
SCREW MNDBLE 2.0X4 LOCKING (Screw) ×3 IMPLANT
SCREW MNDBLE 2.0X6 LOCKING (Screw) ×3 IMPLANT
SCREW MNDBLE 2.0X8 LOCKING (Screw) ×9 IMPLANT
SUT CHROMIC 3 0 PS 2 (SUTURE) ×9 IMPLANT
SUT CHROMIC 4 0 PS 2 18 (SUTURE) ×3 IMPLANT
SUT ETHILON 5 0 P 3 18 (SUTURE)
SUT NYLON ETHILON 5-0 P-3 1X18 (SUTURE) IMPLANT
SUT SILK 2 0 FS (SUTURE) IMPLANT
SUT STEEL 0 (SUTURE)
SUT STEEL 0 18XMFL TIE 17 (SUTURE) IMPLANT
SUT STEEL 4 (SUTURE) ×3 IMPLANT
TOWEL OR 17X24 6PK STRL BLUE (TOWEL DISPOSABLE) IMPLANT
TRAY ENT MC OR (CUSTOM PROCEDURE TRAY) ×3 IMPLANT
WATER STERILE IRR 1000ML POUR (IV SOLUTION) ×3 IMPLANT

## 2016-03-22 NOTE — Transfer of Care (Signed)
Immediate Anesthesia Transfer of Care Note  Patient: Kimberly [name]  Procedure(s) Performed: Procedure(s): OPEN REDUCTION INTERNAL FIXATION (ORIF) MANDIBULAR FRACTURE (N/A)  Patient Location: PACU  Anesthesia Type:General  Level of Consciousness: awake, alert  and patient cooperative  Airway & Oxygen Therapy: Patient Spontanous Breathing and Patient connected to face mask oxygen  Post-op Assessment: Report given to RN and Post -op Vital signs reviewed and stable  Post vital signs: Reviewed and stable  Last Vitals:  Filed Vitals:   03/21/16 2131 03/22/16 0537  BP: 132/65 123/65  Pulse: 65 64  Temp: 36.8 C 36.8 C  Resp: 18 18    Last Pain:  Filed Vitals:   03/22/16 0537  PainSc: Asleep         Complications: No apparent anesthesia complications

## 2016-03-22 NOTE — Progress Notes (Signed)
Report called to short stay. 
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
## 2016-03-22 NOTE — Progress Notes (Signed)
Patient appears to have a non-healed or old fracture in the body and therefore needs to be on antibiotics for at least 2 weeks after discharge. Clindamycin 300 mg 3 times a day would be the drug of choice. She should follow-up in my office in one week

## 2016-03-22 NOTE — Discharge Instructions (Signed)
Stay on a soft diet. Follow-up in one week take the clindamycin 300 mg pill 3 times a day for 10 days. Call if there is any increased swelling, redness, or increased pain. Rinse the mouth out with saline twice a day and especially after eating.

## 2016-03-22 NOTE — Anesthesia Postprocedure Evaluation (Signed)
Anesthesia Post Note  Patient: Kimberly [name]  Procedure(s) Performed: Procedure(s) (LRB): OPEN REDUCTION INTERNAL FIXATION (ORIF) MANDIBULAR FRACTURE (N/A)  Patient location during evaluation: PACU Anesthesia Type: General Level of consciousness: awake and alert Pain management: pain level controlled Vital Signs Assessment: post-procedure vital signs reviewed and stable Respiratory status: spontaneous breathing, nonlabored ventilation, respiratory function stable and patient connected to nasal cannula oxygen Cardiovascular status: blood pressure returned to baseline and stable Postop Assessment: no signs of nausea or vomiting Anesthetic complications: no    Last Vitals:  Filed Vitals:   03/22/16 1114 03/22/16 1115  BP: 158/79   Pulse: 105 103  Temp:  36.1 C  Resp: 27 18    Last Pain:  Filed Vitals:   03/22/16 1123  PainSc: Asleep                 Zenaida Deed

## 2016-03-22 NOTE — H&P (Signed)
  Read over the hospitalists H/P and no changes from ENT point of view. Patient still with same finding of the mandible and face. Will proceed with mandibular surgery.Discussed risks, benefits and options oif ORIF mandible fracture. All questions answered and consent obtained.

## 2016-03-22 NOTE — Anesthesia Preprocedure Evaluation (Addendum)
Anesthesia Evaluation  Patient identified by MRN, date of birth, ID band Patient awake    Reviewed: Allergy & Precautions, NPO status , Patient's Chart, lab work & pertinent test results  Airway Mallampati: III  TM Distance: >3 FB Neck ROM: Full  Mouth opening: Limited Mouth Opening  Dental  (+) Dental Advisory Given, Poor Dentition, Missing   Pulmonary Current Smoker,    Pulmonary exam normal breath sounds clear to auscultation       Cardiovascular negative cardio ROS Normal cardiovascular exam Rhythm:Regular Rate:Normal     Neuro/Psych PSYCHIATRIC DISORDERS Anxiety Depression Bipolar Disorder negative neurological ROS     GI/Hepatic negative GI ROS, (+)     substance abuse ("a few beers daily")  alcohol use,   Endo/Other  Hypothyroidism   Renal/GU negative Renal ROS     Musculoskeletal right mandibular body and angle fractures which are nondisplaced   Abdominal   Peds  Hematology  (+) Blood dyscrasia, anemia ,   Anesthesia Other Findings Day of surgery medications reviewed with the patient.  Reproductive/Obstetrics                           Anesthesia Physical Anesthesia Plan  ASA: II  Anesthesia Plan: General   Post-op Pain Management:    Induction: Intravenous  Airway Management Planned: Video Laryngoscope Planned and Nasal ETT  Additional Equipment:   Intra-op Plan:   Post-operative Plan: Extubation in OR  Informed Consent: I have reviewed the patients History and Physical, chart, labs and discussed the procedure including the risks, benefits and alternatives for the proposed anesthesia with the patient or authorized representative who has indicated his/her understanding and acceptance.   Dental advisory given  Plan Discussed with: CRNA  Anesthesia Plan Comments: (Risks/benefits of general anesthesia discussed with patient including risk of damage to teeth, lips, gum,  and tongue, nausea/vomiting, allergic reactions to medications, and the possibility of heart attack, stroke and death.  All patient questions answered.  Patient wishes to proceed.)       Anesthesia Quick Evaluation

## 2016-03-22 NOTE — Op Note (Signed)
Preop/postop diagnosis: Mandible fracture, facial laceration Procedure: Open reduction internal fixation of body and ramus fracture, closure of facial laceration Anesthesia: Gen. Estimated blood loss: Less than 10 cc Indications: 55 year old who's had another injury to her mandible. She's previously had a mandible fracture repaired and has plates already on the left side of her mandible body. She now has a new injury with a body fracture and a right ramus fracture. The body fracture has some changes that might suggest it has a nonunion or older fracture but it's difficult to tell for sure. She is having a lot of discomfort. She is some dentition but none of the teeth meet together so she essentially has no occlusion. She now that she is sober from her alcohol on presentation she now states that she has numbness of the right lower lip. She understands this is a risk of this fracture as well as the repair. Its unclear if this complication of her fracture will return. She was informed risk and benefits of the procedure and options are discussed all questions are answered and consent was obtained. Operation: Patient was taken to the operating room placed in the supine position after nasal endotracheal tube intubation the gingival labial sulcus region on the right was injected 1% lidocaine with 1 100,000 epinephrine. An incision was made in the right anterior gingivolabial sulcus with electrocautery dissection down to the bone superiorly. Dissection with a Soil scientist was then performed identifying the mental nerve fairly easily and making sure not to violate or disturb it. The dissection was carried out over the fracture line which did have some healing properties but it did seem to move slightly. Mental nerve inferiorly along the rim of the mandible. A #6 plate was then fashioned bent and positioned over this fracture line. Screws were placed posterior and anterior to the fracture line. The nerve was still  intact tendon was no undue pressure on the nerve from the plate. The posterior fracture was entered addressed. An incision was made just posterior to the last molar. Dissection was carried down to the ramus region and the fracture line was easily identified. A 4 hole plate was placed over this fracture line bent to conform to the curvature of this bone location. 4 screws were placed. Both wounds were then irrigated and closed with running 3-0 chromic in the anterior and interrupted 3-0 chromic in the posterior wound. She had a laceration at her glabellar region that was cleaned up with Betadine and saline and glue was placed over this. She was awake and brought to recovery room in stable condition counts correct

## 2016-03-22 NOTE — Anesthesia Procedure Notes (Addendum)
Procedure Name: Intubation Date/Time: 03/22/2016 9:16 AM Performed by: Salli Quarry Emidio Warrell Pre-anesthesia Checklist: Patient identified, Emergency Drugs available, Suction available and Patient being monitored Patient Re-evaluated:Patient Re-evaluated prior to inductionOxygen Delivery Method: Circle System Utilized Preoxygenation: Pre-oxygenation with 100% oxygen Intubation Type: IV induction Ventilation: Mask ventilation without difficulty Laryngoscope Size: Glidescope and 3 Grade View: Grade I Nasal Tubes: Nasal Rae, Nasal prep performed, Magill forceps- large, utilized and Left Tube size: 7.0 mm Number of attempts: 1 Airway Equipment and Method: Video-laryngoscopy Placement Confirmation: ETT inserted through vocal cords under direct vision,  positive ETCO2 and breath sounds checked- equal and bilateral Tube secured with: Tape Dental Injury: Teeth and Oropharynx as per pre-operative assessment

## 2016-03-23 ENCOUNTER — Inpatient Hospital Stay (HOSPITAL_COMMUNITY): Payer: Self-pay

## 2016-03-23 LAB — CBC WITH DIFFERENTIAL/PLATELET
Basophils Absolute: 0 10*3/uL (ref 0.0–0.1)
Basophils Relative: 0 %
Eosinophils Absolute: 0.1 10*3/uL (ref 0.0–0.7)
Eosinophils Relative: 1 %
HCT: 39.3 % (ref 36.0–46.0)
Hemoglobin: 13.1 g/dL (ref 12.0–15.0)
Lymphocytes Relative: 13 %
Lymphs Abs: 2.6 10*3/uL (ref 0.7–4.0)
MCH: 34 pg (ref 26.0–34.0)
MCHC: 33.3 g/dL (ref 30.0–36.0)
MCV: 102.1 fL — ABNORMAL HIGH (ref 78.0–100.0)
Monocytes Absolute: 2 10*3/uL — ABNORMAL HIGH (ref 0.1–1.0)
Monocytes Relative: 10 %
Neutro Abs: 14.6 10*3/uL — ABNORMAL HIGH (ref 1.7–7.7)
Neutrophils Relative %: 76 %
Platelets: 239 10*3/uL (ref 150–400)
RBC: 3.85 MIL/uL — ABNORMAL LOW (ref 3.87–5.11)
RDW: 12.9 % (ref 11.5–15.5)
WBC: 19.3 10*3/uL — ABNORMAL HIGH (ref 4.0–10.5)

## 2016-03-23 LAB — COMPREHENSIVE METABOLIC PANEL
ALT: 22 U/L (ref 14–54)
AST: 25 U/L (ref 15–41)
Albumin: 3 g/dL — ABNORMAL LOW (ref 3.5–5.0)
Alkaline Phosphatase: 78 U/L (ref 38–126)
Anion gap: 10 (ref 5–15)
BUN: <5 mg/dL — ABNORMAL LOW (ref 6–20)
CO2: 21 mmol/L — ABNORMAL LOW (ref 22–32)
Calcium: 8.8 mg/dL — ABNORMAL LOW (ref 8.9–10.3)
Chloride: 105 mmol/L (ref 101–111)
Creatinine, Ser: 0.52 mg/dL (ref 0.44–1.00)
GFR calc Af Amer: >60 mL/min (ref >60)
GFR calc non Af Amer: >60 mL/min (ref >60)
Glucose, Bld: 91 mg/dL (ref 65–99)
Potassium: 3.6 mmol/L (ref 3.5–5.1)
Sodium: 136 mmol/L (ref 135–145)
Total Bilirubin: 0.6 mg/dL (ref 0.3–1.2)
Total Protein: 6.8 g/dL (ref 6.5–8.1)

## 2016-03-23 LAB — MAGNESIUM: Magnesium: 1.6 mg/dL — ABNORMAL LOW (ref 1.7–2.4)

## 2016-03-23 NOTE — Progress Notes (Signed)
I am assuming care at 1100. Patient has not been taking po fluids or meds. Patient instructed that she must start to drink po fluids and soft diet as tol.  She drank some gingerale without difficulty, will continue to encourage po fluids.
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
## 2016-03-24 DIAGNOSIS — Z1159 Encounter for screening for other viral diseases: Secondary | ICD-10-CM

## 2016-03-24 LAB — CBC WITH DIFFERENTIAL/PLATELET
Basophils Absolute: 0 10*3/uL (ref 0.0–0.1)
Basophils Relative: 0 %
Eosinophils Absolute: 0.3 10*3/uL (ref 0.0–0.7)
Eosinophils Relative: 2 %
HCT: 37.3 % (ref 36.0–46.0)
Hemoglobin: 12.6 g/dL (ref 12.0–15.0)
Lymphocytes Relative: 13 %
Lymphs Abs: 2.2 10*3/uL (ref 0.7–4.0)
MCH: 34.1 pg — ABNORMAL HIGH (ref 26.0–34.0)
MCHC: 33.8 g/dL (ref 30.0–36.0)
MCV: 100.8 fL — ABNORMAL HIGH (ref 78.0–100.0)
Monocytes Absolute: 1.8 10*3/uL — ABNORMAL HIGH (ref 0.1–1.0)
Monocytes Relative: 11 %
Neutro Abs: 12.7 10*3/uL — ABNORMAL HIGH (ref 1.7–7.7)
Neutrophils Relative %: 74 %
Platelets: 237 10*3/uL (ref 150–400)
RBC: 3.7 MIL/uL — ABNORMAL LOW (ref 3.87–5.11)
RDW: 12.8 % (ref 11.5–15.5)
WBC: 16.9 10*3/uL — ABNORMAL HIGH (ref 4.0–10.5)

## 2016-03-24 LAB — COMPREHENSIVE METABOLIC PANEL
ALT: 18 U/L (ref 14–54)
AST: 21 U/L (ref 15–41)
Albumin: 2.8 g/dL — ABNORMAL LOW (ref 3.5–5.0)
Alkaline Phosphatase: 70 U/L (ref 38–126)
Anion gap: 9 (ref 5–15)
BUN: <5 mg/dL — ABNORMAL LOW (ref 6–20)
CO2: 22 mmol/L (ref 22–32)
Calcium: 8.7 mg/dL — ABNORMAL LOW (ref 8.9–10.3)
Chloride: 105 mmol/L (ref 101–111)
Creatinine, Ser: 0.59 mg/dL (ref 0.44–1.00)
GFR calc Af Amer: >60 mL/min (ref >60)
GFR calc non Af Amer: >60 mL/min (ref >60)
Glucose, Bld: 113 mg/dL — ABNORMAL HIGH (ref 65–99)
Potassium: 3.3 mmol/L — ABNORMAL LOW (ref 3.5–5.1)
Sodium: 136 mmol/L (ref 135–145)
Total Bilirubin: 0.9 mg/dL (ref 0.3–1.2)
Total Protein: 6.6 g/dL (ref 6.5–8.1)

## 2016-03-24 MED ORDER — ENSURE ENLIVE PO LIQD
237.0000 mL | Freq: Two times a day (BID) | ORAL | Status: DC
  Administered 2016-03-24 – 2016-03-25 (×4): 237 mL via ORAL

## 2016-03-24 MED ORDER — OXYCODONE HCL 5 MG PO TABS
5.0000 mg | ORAL_TABLET | ORAL | Status: DC | PRN
  Administered 2016-03-24: 10 mg via ORAL
  Administered 2016-03-24: 5 mg via ORAL
  Administered 2016-03-24 – 2016-03-25 (×3): 10 mg via ORAL
  Filled 2016-03-24 (×4): qty 2
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
## 2016-03-25 ENCOUNTER — Encounter (HOSPITAL_COMMUNITY): Payer: Self-pay | Admitting: Otolaryngology

## 2016-03-25 LAB — COMPREHENSIVE METABOLIC PANEL
ALT: 19 U/L (ref 14–54)
AST: 19 U/L (ref 15–41)
Albumin: 2.7 g/dL — ABNORMAL LOW (ref 3.5–5.0)
Alkaline Phosphatase: 68 U/L (ref 38–126)
Anion gap: 9 (ref 5–15)
BUN: <5 mg/dL — ABNORMAL LOW (ref 6–20)
CO2: 23 mmol/L (ref 22–32)
Calcium: 8.6 mg/dL — ABNORMAL LOW (ref 8.9–10.3)
Chloride: 106 mmol/L (ref 101–111)
Creatinine, Ser: 0.57 mg/dL (ref 0.44–1.00)
GFR calc Af Amer: >60 mL/min (ref >60)
GFR calc non Af Amer: >60 mL/min (ref >60)
Glucose, Bld: 114 mg/dL — ABNORMAL HIGH (ref 65–99)
Potassium: 3.4 mmol/L — ABNORMAL LOW (ref 3.5–5.1)
Sodium: 138 mmol/L (ref 135–145)
Total Bilirubin: 0.4 mg/dL (ref 0.3–1.2)
Total Protein: 6.4 g/dL — ABNORMAL LOW (ref 6.5–8.1)

## 2016-03-25 LAB — CBC WITH DIFFERENTIAL/PLATELET
Basophils Absolute: 0 10*3/uL (ref 0.0–0.1)
Basophils Relative: 0 %
Eosinophils Absolute: 0.3 10*3/uL (ref 0.0–0.7)
Eosinophils Relative: 3 %
HCT: 33.4 % — ABNORMAL LOW (ref 36.0–46.0)
Hemoglobin: 11.3 g/dL — ABNORMAL LOW (ref 12.0–15.0)
Lymphocytes Relative: 19 %
Lymphs Abs: 2.5 10*3/uL (ref 0.7–4.0)
MCH: 34.2 pg — ABNORMAL HIGH (ref 26.0–34.0)
MCHC: 33.8 g/dL (ref 30.0–36.0)
MCV: 101.2 fL — ABNORMAL HIGH (ref 78.0–100.0)
Monocytes Absolute: 1 10*3/uL (ref 0.1–1.0)
Monocytes Relative: 8 %
Neutro Abs: 8.9 10*3/uL — ABNORMAL HIGH (ref 1.7–7.7)
Neutrophils Relative %: 70 %
Platelets: 227 10*3/uL (ref 150–400)
RBC: 3.3 MIL/uL — ABNORMAL LOW (ref 3.87–5.11)
RDW: 12.8 % (ref 11.5–15.5)
WBC: 12.6 10*3/uL — ABNORMAL HIGH (ref 4.0–10.5)

## 2016-03-25 MED ORDER — METHOCARBAMOL 500 MG PO TABS: 500.0000 mg | ORAL_TABLET | Freq: Four times a day (QID) | ORAL | Status: DC | PRN

## 2016-03-25 MED ORDER — DIPHENHYDRAMINE HCL 25 MG PO CAPS
25.0000 mg | ORAL_CAPSULE | Freq: Four times a day (QID) | ORAL | Status: DC | PRN
  Administered 2016-03-25: 25 mg via ORAL
  Filled 2016-03-25: qty 1

## 2016-03-25 MED ORDER — HYDROCODONE-ACETAMINOPHEN 5-325 MG PO TABS
1.0000 | ORAL_TABLET | Freq: Four times a day (QID) | ORAL | Status: DC | PRN
  Administered 2016-03-25 – 2016-03-26 (×3): 2 via ORAL
  Filled 2016-03-25 (×3): qty 2

## 2016-03-25 NOTE — Care Management Note (Signed)
Case Management Note  Patient Details  Name: Kimberly [name] MRN: [id number] Date of Birth: 12/18/1960  Subjective/Objective:                    Action/Plan:  [facility] to schedule appointment , they are booked right now and instructed CM to have patient call back in a few days to schedule . Patient aware and voiced understanding Expected Discharge Date:                  Expected Discharge Plan:  Home/Self Care  In-House Referral:     Discharge planning Services  CM Consult, [place], Medication Assistance, [facility]  Post Acute Care Choice:    Choice offered to:  Patient  DME Arranged:    DME Agency:     HH Arranged:    [place] Agency:     Status of Service:     Medicare Important Message Given:    Date Medicare IM Given:    Medicare IM give by:    Date Additional Medicare IM Given:    Additional Medicare Important Message give by:     If discussed at [place] of Stay Meetings, dates discussed:    Additional Comments:  Marilu Favre, RN 03/25/2016, 12:26 PM

## 2016-03-25 NOTE — Clinical Social Work Note (Signed)
PT not recommending any follow up CSW to sign off.  Jones Broom. [place], MSW, [place] 03/25/2016 12:09 PM
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
## 2016-03-25 NOTE — Progress Notes (Signed)
OT Cancellation Note and Discharge  Patient Details Name: Paeyton Speese MRN: [id number] DOB: 01/16/1961   Cancelled Treatment:    Reason Eval/Treat Not Completed: OT screened, no needs identified, will sign off. Noted pt Mod I with PT earlier with all basic mobility and will have 24 hour A. Spoke with PT who stated that she did not note any OT needs-- pt getting up and moving around the room managing her own IV pole.  Almon Register [id number] 03/25/2016, 12:35 PM

## 2016-03-26 LAB — COMMENT2 - HEP PANEL

## 2016-03-26 LAB — HEPATITIS C ANTIBODY (REFLEX): HCV Ab: >11 s/co ratio — ABNORMAL HIGH (ref 0.0–0.9)

## 2016-03-26 MED ORDER — METHOCARBAMOL 500 MG PO TABS: 500.0000 mg | ORAL_TABLET | Freq: Four times a day (QID) | ORAL | Status: DC | PRN

## 2016-03-26 MED ORDER — FOLIC ACID 1 MG PO TABS: 1.0000 mg | ORAL_TABLET | Freq: Every day | ORAL | Status: DC

## 2016-03-26 MED ORDER — ENSURE ENLIVE PO LIQD
237.0000 mL | Freq: Two times a day (BID) | ORAL | Status: DC
Start: 2016-03-26 — End: 2019-02-09

## 2016-03-26 MED ORDER — THIAMINE HCL 100 MG PO TABS: 100.0000 mg | ORAL_TABLET | Freq: Every day | ORAL | Status: DC

## 2016-03-26 MED ORDER — CLINDAMYCIN HCL 300 MG PO CAPS: 600.0000 mg | ORAL_CAPSULE | Freq: Three times a day (TID) | ORAL | Status: DC

## 2016-03-26 MED ORDER — ENSURE ENLIVE PO LIQD: 237.0000 mL | Freq: Two times a day (BID) | ORAL | Status: DC

## 2016-03-26 MED ORDER — HYDROCODONE-ACETAMINOPHEN 5-325 MG PO TABS: 1.0000 | ORAL_TABLET | Freq: Four times a day (QID) | ORAL | Status: DC | PRN

## 2016-03-26 MED ORDER — CLINDAMYCIN HCL 300 MG PO CAPS
600.0000 mg | ORAL_CAPSULE | Freq: Three times a day (TID) | ORAL | Status: AC
Start: 2016-03-26 — End: 2016-04-04

## 2016-03-26 NOTE — Progress Notes (Signed)
Patient removed IV.  AVS given to patient with paper prescriptions.  Understanding demonstrated. Belongings packed. Transportation arranged by patient.
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
## 2016-04-27 ENCOUNTER — Emergency Department (HOSPITAL_COMMUNITY)
Admission: EM | Admit: 2016-04-27 | Discharge: 2016-04-27 | Disposition: A | Discharge status: Home / Self Care | Payer: Self-pay | Attending: Emergency Medicine | Admitting: Emergency Medicine

## 2016-04-27 ENCOUNTER — Encounter (HOSPITAL_COMMUNITY): Payer: Self-pay

## 2016-04-27 ENCOUNTER — Emergency Department (HOSPITAL_COMMUNITY): Payer: Self-pay

## 2016-04-27 DIAGNOSIS — K529 Noninfective gastroenteritis and colitis, unspecified: Secondary | ICD-10-CM | POA: Insufficient documentation

## 2016-04-27 DIAGNOSIS — N390 Urinary tract infection, site not specified: Secondary | ICD-10-CM | POA: Insufficient documentation

## 2016-04-27 DIAGNOSIS — Z79899 Other long term (current) drug therapy: Secondary | ICD-10-CM | POA: Insufficient documentation

## 2016-04-27 DIAGNOSIS — F17210 Nicotine dependence, cigarettes, uncomplicated: Secondary | ICD-10-CM | POA: Insufficient documentation

## 2016-04-27 DIAGNOSIS — Z7982 Long term (current) use of aspirin: Secondary | ICD-10-CM | POA: Insufficient documentation

## 2016-04-27 DIAGNOSIS — F319 Bipolar disorder, unspecified: Secondary | ICD-10-CM | POA: Insufficient documentation

## 2016-04-27 LAB — URINE MICROSCOPIC-ADD ON

## 2016-04-27 LAB — URINALYSIS, ROUTINE W REFLEX MICROSCOPIC
Glucose, UA: NEGATIVE mg/dL
Ketones, ur: 40 mg/dL — AB
Nitrite: NEGATIVE
Protein, ur: NEGATIVE mg/dL
Specific Gravity, Urine: 1.023 (ref 1.005–1.030)
pH: 6 (ref 5.0–8.0)

## 2016-04-27 LAB — CBC
HCT: 44 % (ref 36.0–46.0)
Hemoglobin: 14.8 g/dL (ref 12.0–15.0)
MCH: 33.3 pg (ref 26.0–34.0)
MCHC: 33.6 g/dL (ref 30.0–36.0)
MCV: 99.1 fL (ref 78.0–100.0)
Platelets: 231 10*3/uL (ref 150–400)
RBC: 4.44 MIL/uL (ref 3.87–5.11)
RDW: 13.6 % (ref 11.5–15.5)
WBC: 15.8 10*3/uL — ABNORMAL HIGH (ref 4.0–10.5)

## 2016-04-27 LAB — COMPREHENSIVE METABOLIC PANEL
ALT: 34 U/L (ref 14–54)
AST: 32 U/L (ref 15–41)
Albumin: 3.6 g/dL (ref 3.5–5.0)
Alkaline Phosphatase: 89 U/L (ref 38–126)
Anion gap: 7 (ref 5–15)
BUN: <5 mg/dL — ABNORMAL LOW (ref 6–20)
CO2: 24 mmol/L (ref 22–32)
Calcium: 9.3 mg/dL (ref 8.9–10.3)
Chloride: 104 mmol/L (ref 101–111)
Creatinine, Ser: 0.74 mg/dL (ref 0.44–1.00)
GFR calc Af Amer: >60 mL/min (ref >60)
GFR calc non Af Amer: >60 mL/min (ref >60)
Glucose, Bld: 99 mg/dL (ref 65–99)
Potassium: 3.4 mmol/L — ABNORMAL LOW (ref 3.5–5.1)
Sodium: 135 mmol/L (ref 135–145)
Total Bilirubin: 1 mg/dL (ref 0.3–1.2)
Total Protein: 7.7 g/dL (ref 6.5–8.1)

## 2016-04-27 LAB — LIPASE, BLOOD: Lipase: 15 U/L (ref 11–51)

## 2016-04-27 MED ORDER — ONDANSETRON HCL 4 MG PO TABS: 4.0000 mg | ORAL_TABLET | Freq: Four times a day (QID) | ORAL | Status: DC

## 2016-04-27 MED ORDER — SODIUM CHLORIDE 0.9 % IV BOLUS (SEPSIS)
1000.0000 mL | Freq: Once | INTRAVENOUS | Status: AC
  Administered 2016-04-27: 1000 mL via INTRAVENOUS

## 2016-04-27 MED ORDER — GI COCKTAIL ~~LOC~~
30.0000 mL | Freq: Once | ORAL | Status: AC
  Administered 2016-04-27: 30 mL via ORAL
  Filled 2016-04-27: qty 30

## 2016-04-27 MED ORDER — FENTANYL CITRATE (PF) 100 MCG/2ML IJ SOLN
50.0000 ug | Freq: Once | INTRAMUSCULAR | Status: AC
  Administered 2016-04-27: 50 ug via INTRAVENOUS
  Filled 2016-04-27: qty 2

## 2016-04-27 MED ORDER — ONDANSETRON HCL 4 MG/2ML IJ SOLN
4.0000 mg | Freq: Once | INTRAMUSCULAR | Status: AC
  Administered 2016-04-27: 4 mg via INTRAVENOUS
  Filled 2016-04-27: qty 2

## 2016-04-27 MED ORDER — AMOXICILLIN-POT CLAVULANATE 875-125 MG PO TABS: 1.0000 | ORAL_TABLET | Freq: Two times a day (BID) | ORAL | Status: DC

## 2016-04-27 MED ORDER — AMOXICILLIN-POT CLAVULANATE 875-125 MG PO TABS
1.0000 | ORAL_TABLET | Freq: Once | ORAL | Status: AC
  Administered 2016-04-27: 1 via ORAL
  Filled 2016-04-27: qty 1

## 2016-04-27 MED ORDER — IOPAMIDOL (ISOVUE-300) INJECTION 61%
INTRAVENOUS | Status: AC
  Administered 2016-04-27: 100 mL
  Filled 2016-04-27: qty 100

## 2016-04-27 NOTE — ED Notes (Signed)
Taken to CT at this time. 

## 2016-04-27 NOTE — ED Notes (Addendum)
Resting on stretcher at this time with no distress noted.

## 2016-04-27 NOTE — ED Provider Notes (Signed)
CSN: [id number]     Arrival date & time 04/27/16  1518 History   First MD Initiated Contact with Patient 04/27/16 1723     Chief Complaint  Patient presents with  . Abdominal Cramping     (Consider location/radiation/quality/duration/timing/severity/associated sxs/prior Treatment) HPI   2 days of generalized abdominal cramping and intermittent in nature associated with vomiting and diarrhea. After multiple episodes of diarrhea she had a spot of blood on the tissue but no blood in her stool. She is vomiting stomach contents, she had approximately 9 total episodes but has been slowing down today. She also had approximately 11 episodes of diarrhea but once again this is slowed down today as well. She denies any fevers, sick contacts, recent travels or suspicious food.  Past Medical History  Diagnosis Date  . Depression   . Bipolar 1 disorder ([place])   . Anxiety   . Thyroid disease    Past Surgical History  Procedure Laterality Date  . Acromio-clavicular joint repair Left 02/24/2015    Procedure: LEFT ACROMIO-CLAVICULAR JOINT RECONSTRUCTION;  Surgeon: Leandrew Koyanagi, MD;  Location: [facility];  Service: Orthopedics;  Laterality: Left;  . Orif mandibular fracture N/A 03/22/2016    Procedure: OPEN REDUCTION INTERNAL FIXATION (ORIF) MANDIBULAR FRACTURE;  Surgeon: Melissa Montane, MD;  Location: [facility];  Service: ENT;  Laterality: N/A;   No family history on file. Social History  Substance Use Topics  . Smoking status: Current Every Day Smoker    Types: Cigarettes  . Smokeless tobacco: None  . Alcohol Use: Yes   OB History    No data available     Review of Systems  Constitutional: Positive for appetite change. Negative for fever.  Gastrointestinal: Positive for nausea, vomiting, abdominal pain and diarrhea.  All other systems reviewed and are negative.     Allergies  Review of patient's allergies indicates no known allergies.  Home Medications   Prior to Admission medications   Medication  Sig Start Date End Date Taking? Authorizing Provider  Aspirin-Caffeine (BC FAST PAIN RELIEF PO) Take 1 packet by mouth daily as needed (for paiin).   Yes Historical Provider, MD  feeding supplement, ENSURE ENLIVE, (ENSURE ENLIVE) LIQD Take 237 mLs by mouth 2 (two) times daily between meals. 03/26/16  Yes Lavina Hamman, MD  FLUoxetine (PROZAC) 20 MG tablet Take 1 tablet (20 mg total) by mouth daily. 08/25/14  Yes Carmin Muskrat, MD  folic acid (FOLVITE) 1 MG tablet Take 1 tablet (1 mg total) by mouth daily. 03/26/16  Yes Lavina Hamman, MD  HYDROcodone-acetaminophen (NORCO/VICODIN) 5-325 MG tablet Take 1 tablet by mouth every 6 (six) hours as needed for moderate pain or severe pain. 03/26/16  Yes Lavina Hamman, MD  methocarbamol (ROBAXIN) 500 MG tablet Take 1 tablet (500 mg total) by mouth every 6 (six) hours as needed for muscle spasms. 03/26/16  Yes Lavina Hamman, MD  risperiDONE (RISPERDAL) 2 MG tablet Take 1 tablet (2 mg total) by mouth daily. 08/25/14  Yes Carmin Muskrat, MD  thiamine 100 MG tablet Take 1 tablet (100 mg total) by mouth daily. 03/26/16  Yes Lavina Hamman, MD  traZODone (DESYREL) 100 MG tablet Take 100 mg by mouth at bedtime.   Yes Historical Provider, MD  amoxicillin-clavulanate (AUGMENTIN) 875-125 MG tablet Take 1 tablet by mouth 2 (two) times daily. 04/27/16   Merrily Pew, MD  ondansetron (ZOFRAN) 4 MG tablet Take 1 tablet (4 mg total) by mouth every 6 (six) hours. 04/27/16  Merrily Pew, MD   BP 118/76 mmHg  Pulse 66  Temp(Src) 98.6 F (37 C) (Oral)  Resp 18  Ht 5\' 7"  (1.702 m)  Wt 150 lb (68.04 kg)  BMI 23.49 kg/m2  SpO2 99%  LMP 10/09/2012 Physical Exam  Constitutional: She is oriented to person, place, and time. She appears well-developed and well-nourished.  HENT:  Head: Normocephalic and atraumatic.  Mouth/Throat: Mucous membranes are dry.  Neck: Normal range of motion.  Cardiovascular: Normal rate and regular rhythm.   Pulmonary/Chest: No stridor. No respiratory  distress. She has no wheezes.  Abdominal: She exhibits no distension. There is tenderness.  Neurological: She is alert and oriented to person, place, and time. No cranial nerve deficit. Coordination normal.  Skin: Skin is warm and dry.  Nursing note and vitals reviewed.   ED Course  Procedures (including critical care time) Labs Review Labs Reviewed  COMPREHENSIVE METABOLIC PANEL - Abnormal; Notable for the following:    Potassium 3.4 (*)    BUN <5 (*)    All other components within normal limits  CBC - Abnormal; Notable for the following:    WBC 15.8 (*)    All other components within normal limits  URINALYSIS, ROUTINE W REFLEX MICROSCOPIC (NOT AT [facility]) - Abnormal; Notable for the following:    Color, Urine AMBER (*)    APPearance CLOUDY (*)    Hgb urine dipstick LARGE (*)    Bilirubin Urine SMALL (*)    Ketones, ur 40 (*)    Leukocytes, UA MODERATE (*)    All other components within normal limits  URINE MICROSCOPIC-ADD ON - Abnormal; Notable for the following:    Squamous Epithelial / LPF 6-30 (*)    Bacteria, UA FEW (*)    All other components within normal limits  LIPASE, BLOOD    Imaging Review Ct Abdomen Pelvis W Contrast  04/27/2016  CLINICAL DATA:  Abdominal pain.  Vomiting and diarrhea. EXAM: CT ABDOMEN AND PELVIS WITH CONTRAST TECHNIQUE: Multidetector CT imaging of the abdomen and pelvis was performed using the standard protocol following bolus administration of intravenous contrast. CONTRAST:  142mL ISOVUE-300 IOPAMIDOL (ISOVUE-300) INJECTION 61% COMPARISON:  None. FINDINGS: Normal lung bases. No free air. A tiny amount of free fluid is seen in the pelvis. There is colonic wall thickening from the cecum into the mid duodenum with no pneumatosis. There is also mucosal enhancement within the abnormal colon. There is pericolonic stranding in these regions as well. The more distal colon and rectum are normal. The small bowel and stomach are normal. There is a in the oval  region of low attenuation the liver on series 2, image 26, too small to characterize but probably benign such as a mildly complicated cyst or hemangioma. The remainder of the liver is normal. The portal vein and gallbladder is unremarkable. The spleen, adrenal glands and pancreas are normal. There is a stone in the lower pole of the left kidney measuring 9 mm. No other renal stones. No hydronephrosis on the right. Mild prominence of the left renal collecting system may be due to reflux or previous obstruction has there is no evidence of current obstruction. The ureters are normal in caliber. The abdominal aorta is non aneurysmal. The celiac, superior mesenteric, and inferior mesenteric arteries are grossly normal proximally. Mild atherosclerotic change seen. No adenopathy. There is a fibroid uterus. There is a small amount of free fluid in the pelvis. The bladder wall is mildly thickened, possibly due to poor distention and of  doubtful acute significance. No acute bony abnormalities. Delayed images through the upper renal collecting systems demonstrate mild pooling of contrast in the mildly dilated left renal collecting system. However, there is normal excretion bilaterally with no filling defects. IMPRESSION: 1. Marked colonic wall thickening from the cecum into the mid descending colon. This is consistent with colitis which could be infectious, inflammatory, or ischemic. Recommend clinical correlation. Electronically Signed   By: Dorise Bullion III M.D   On: 04/27/2016 19:41   I have personally reviewed and evaluated these images and lab results as part of my medical decision-making.   EKG Interpretation None      MDM   Final diagnoses:  UTI (lower urinary tract infection)  Colitis    Patient with likely colitis as a cause of her symptoms. She also has a possible UTI so started on Augmentin. Patient's repeat abdominal exam was benign without any tenderness, rebound or guarding. Patient's pain is  also improved. She was tolerating by mouth. Patient no signs or symptoms return to the emergency department otherwise will follow-up with her primary doctor.  New Prescriptions: Discharge Medication List as of 04/27/2016  8:31 PM    START taking these medications   Details  amoxicillin-clavulanate (AUGMENTIN) 875-125 MG tablet Take 1 tablet by mouth 2 (two) times daily., Starting 04/27/2016, Until Discontinued, Print    ondansetron (ZOFRAN) 4 MG tablet Take 1 tablet (4 mg total) by mouth every 6 (six) hours., Starting 04/27/2016, Until Discontinued, Print         I have personally and contemperaneously reviewed labs and imaging and used in my decision making as above.   A medical screening exam was performed and I feel the patient has had an appropriate workup for their chief complaint at this time and likelihood of emergent condition existing is low and thus workup can continue on an outpatient basis.. Their vital signs are stable. They have been counseled on decision, discharge, follow up and which symptoms necessitate immediate return to the emergency department.  They verbally stated understanding and agreement with plan and discharged in stable condition.      Merrily Pew, MD 04/27/16 [phone]

## 2016-04-27 NOTE — ED Notes (Signed)
Patient here with abdominal cramping x 2 days. Vomiting and diarrhea with same. Today states that when she wiped she noticed blood on the tissue. No distress

## 2016-08-21 ENCOUNTER — Emergency Department (HOSPITAL_COMMUNITY): Payer: Self-pay

## 2016-08-21 ENCOUNTER — Emergency Department (HOSPITAL_COMMUNITY)
Admission: EM | Admit: 2016-08-21 | Discharge: 2016-08-21 | Disposition: A | Discharge status: Home / Self Care | Payer: Self-pay | Attending: Emergency Medicine | Admitting: Emergency Medicine

## 2016-08-21 ENCOUNTER — Encounter (HOSPITAL_COMMUNITY): Payer: Self-pay | Admitting: *Deleted

## 2016-08-21 DIAGNOSIS — Z7982 Long term (current) use of aspirin: Secondary | ICD-10-CM | POA: Insufficient documentation

## 2016-08-21 DIAGNOSIS — F17210 Nicotine dependence, cigarettes, uncomplicated: Secondary | ICD-10-CM | POA: Insufficient documentation

## 2016-08-21 DIAGNOSIS — Z79899 Other long term (current) drug therapy: Secondary | ICD-10-CM | POA: Insufficient documentation

## 2016-08-21 DIAGNOSIS — R079 Chest pain, unspecified: Secondary | ICD-10-CM | POA: Insufficient documentation

## 2016-08-21 LAB — CBC
HCT: 41.9 % (ref 36.0–46.0)
Hemoglobin: 13.8 g/dL (ref 12.0–15.0)
MCH: 34.2 pg — ABNORMAL HIGH (ref 26.0–34.0)
MCHC: 32.9 g/dL (ref 30.0–36.0)
MCV: 104 fL — ABNORMAL HIGH (ref 78.0–100.0)
Platelets: 276 10*3/uL (ref 150–400)
RBC: 4.03 MIL/uL (ref 3.87–5.11)
RDW: 14.1 % (ref 11.5–15.5)
WBC: 10.3 10*3/uL (ref 4.0–10.5)

## 2016-08-21 LAB — BASIC METABOLIC PANEL
Anion gap: 7 (ref 5–15)
BUN: 13 mg/dL (ref 6–20)
CO2: 24 mmol/L (ref 22–32)
Calcium: 9.8 mg/dL (ref 8.9–10.3)
Chloride: 107 mmol/L (ref 101–111)
Creatinine, Ser: 0.59 mg/dL (ref 0.44–1.00)
GFR calc Af Amer: >60 mL/min (ref >60)
GFR calc non Af Amer: >60 mL/min (ref >60)
Glucose, Bld: 98 mg/dL (ref 65–99)
Potassium: 4.1 mmol/L (ref 3.5–5.1)
Sodium: 138 mmol/L (ref 135–145)

## 2016-08-21 LAB — I-STAT TROPONIN, ED: Troponin i, poc: 0.01 ng/mL (ref 0.00–0.08)

## 2016-08-21 NOTE — ED Notes (Signed)
Called pt's name and no one answered. Nurse was notified.

## 2016-08-21 NOTE — ED Triage Notes (Signed)
The pt just came in from outside reporting that she had been asleep  Although her name was called numerus times for  Recheck vitals and she was not asleep in the waiting room

## 2016-08-21 NOTE — ED Notes (Signed)
No answer x2 

## 2016-08-21 NOTE — ED Triage Notes (Signed)
Pt reports rt sided chest pain for 2 months. Pt states that it now radiated to left chest and reports difficulty eating. Pain worse with movement.

## 2016-08-21 NOTE — ED Notes (Addendum)
Called for pt for room with no response.

## 2016-08-21 NOTE — ED Notes (Signed)
Called pt, no one answered. Nurse was notified.

## 2016-08-21 NOTE — ED Notes (Signed)
Called for pt, no response

## 2016-08-22 ENCOUNTER — Emergency Department (HOSPITAL_COMMUNITY): Payer: Self-pay

## 2016-08-22 ENCOUNTER — Emergency Department (HOSPITAL_COMMUNITY)
Admission: EM | Admit: 2016-08-22 | Discharge: 2016-08-22 | Disposition: A | Discharge status: Home / Self Care | Payer: Self-pay | Attending: Physician Assistant | Admitting: Physician Assistant

## 2016-08-22 ENCOUNTER — Encounter (HOSPITAL_COMMUNITY): Payer: Self-pay | Admitting: Emergency Medicine

## 2016-08-22 DIAGNOSIS — K219 Gastro-esophageal reflux disease without esophagitis: Secondary | ICD-10-CM | POA: Insufficient documentation

## 2016-08-22 DIAGNOSIS — Z7982 Long term (current) use of aspirin: Secondary | ICD-10-CM | POA: Insufficient documentation

## 2016-08-22 DIAGNOSIS — K839 Disease of biliary tract, unspecified: Secondary | ICD-10-CM | POA: Insufficient documentation

## 2016-08-22 DIAGNOSIS — F17210 Nicotine dependence, cigarettes, uncomplicated: Secondary | ICD-10-CM | POA: Insufficient documentation

## 2016-08-22 DIAGNOSIS — R1013 Epigastric pain: Secondary | ICD-10-CM

## 2016-08-22 DIAGNOSIS — R112 Nausea with vomiting, unspecified: Secondary | ICD-10-CM

## 2016-08-22 DIAGNOSIS — K828 Other specified diseases of gallbladder: Secondary | ICD-10-CM

## 2016-08-22 LAB — URINALYSIS, ROUTINE W REFLEX MICROSCOPIC
Bilirubin Urine: NEGATIVE
Glucose, UA: NEGATIVE mg/dL
Ketones, ur: NEGATIVE mg/dL
Nitrite: NEGATIVE
Protein, ur: NEGATIVE mg/dL
Specific Gravity, Urine: 1.024 (ref 1.005–1.030)
pH: 5 (ref 5.0–8.0)

## 2016-08-22 LAB — BASIC METABOLIC PANEL
Anion gap: 7 (ref 5–15)
BUN: 19 mg/dL (ref 6–20)
CO2: 25 mmol/L (ref 22–32)
Calcium: 9.9 mg/dL (ref 8.9–10.3)
Chloride: 105 mmol/L (ref 101–111)
Creatinine, Ser: 0.69 mg/dL (ref 0.44–1.00)
GFR calc Af Amer: >60 mL/min (ref >60)
GFR calc non Af Amer: >60 mL/min (ref >60)
Glucose, Bld: 95 mg/dL (ref 65–99)
Potassium: 4.1 mmol/L (ref 3.5–5.1)
Sodium: 137 mmol/L (ref 135–145)

## 2016-08-22 LAB — HEPATIC FUNCTION PANEL
ALT: 37 U/L (ref 14–54)
AST: 35 U/L (ref 15–41)
Albumin: 4.8 g/dL (ref 3.5–5.0)
Alkaline Phosphatase: 108 U/L (ref 38–126)
Bilirubin, Direct: 0.2 mg/dL (ref 0.1–0.5)
Indirect Bilirubin: 0.5 mg/dL (ref 0.3–0.9)
Total Bilirubin: 0.7 mg/dL (ref 0.3–1.2)
Total Protein: 9.6 g/dL — ABNORMAL HIGH (ref 6.5–8.1)

## 2016-08-22 LAB — URINE MICROSCOPIC-ADD ON

## 2016-08-22 LAB — LIPASE, BLOOD: Lipase: 25 U/L (ref 11–51)

## 2016-08-22 LAB — ETHANOL: Alcohol, Ethyl (B): <5 mg/dL (ref <5)

## 2016-08-22 LAB — CBC
HCT: 44.5 % (ref 36.0–46.0)
Hemoglobin: 15.2 g/dL — ABNORMAL HIGH (ref 12.0–15.0)
MCH: 34.9 pg — ABNORMAL HIGH (ref 26.0–34.0)
MCHC: 34.2 g/dL (ref 30.0–36.0)
MCV: 102.1 fL — ABNORMAL HIGH (ref 78.0–100.0)
Platelets: 279 10*3/uL (ref 150–400)
RBC: 4.36 MIL/uL (ref 3.87–5.11)
RDW: 14.3 % (ref 11.5–15.5)
WBC: 9.7 10*3/uL (ref 4.0–10.5)

## 2016-08-22 LAB — I-STAT TROPONIN, ED: Troponin i, poc: 0 ng/mL (ref 0.00–0.08)

## 2016-08-22 MED ORDER — OXYCODONE-ACETAMINOPHEN 5-325 MG PO TABS
1.0000 | ORAL_TABLET | Freq: Once | ORAL | Status: AC
  Administered 2016-08-22: 1 via ORAL
  Filled 2016-08-22: qty 1

## 2016-08-22 MED ORDER — ONDANSETRON 4 MG PO TBDP: 4.0000 mg | ORAL_TABLET | Freq: Three times a day (TID) | ORAL | 0 refills | Status: DC | PRN

## 2016-08-22 MED ORDER — GI COCKTAIL ~~LOC~~
30.0000 mL | Freq: Once | ORAL | Status: AC
  Administered 2016-08-22: 30 mL via ORAL
  Filled 2016-08-22: qty 30

## 2016-08-22 MED ORDER — SODIUM CHLORIDE 0.9 % IV BOLUS (SEPSIS)
1000.0000 mL | Freq: Once | INTRAVENOUS | Status: AC
  Administered 2016-08-22: 1000 mL via INTRAVENOUS

## 2016-08-22 MED ORDER — RANITIDINE HCL 150 MG PO CAPS: 150.0000 mg | ORAL_CAPSULE | Freq: Every day | ORAL | 0 refills | Status: DC

## 2016-08-22 MED ORDER — OMEPRAZOLE 20 MG PO CPDR: 20.0000 mg | DELAYED_RELEASE_CAPSULE | Freq: Every day | ORAL | 0 refills | Status: DC

## 2016-08-22 MED ORDER — ONDANSETRON HCL 4 MG/2ML IJ SOLN
4.0000 mg | Freq: Once | INTRAMUSCULAR | Status: AC
  Administered 2016-08-22: 4 mg via INTRAVENOUS
  Filled 2016-08-22: qty 2

## 2016-08-22 NOTE — ED Notes (Signed)
Pt actively attempting to force herself to vomit by placing right hand in her mouth in triage.

## 2016-08-22 NOTE — ED Triage Notes (Addendum)
Per EMS, pt from home reports right sided chest pain x1 month. Pt states pain increases with movement and palpation. Pt reports vomiting after she eats x2 months. Hx acid reflux. Denies SOB.
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
## 2016-08-24 LAB — URINE CULTURE: Culture: NO GROWTH

## 2017-07-22 ENCOUNTER — Emergency Department (HOSPITAL_COMMUNITY): Payer: Self-pay | Admitting: Certified Registered"

## 2017-07-22 ENCOUNTER — Encounter (HOSPITAL_COMMUNITY)
Admission: EM | Disposition: A | Discharge status: Home / Self Care | Payer: Self-pay | Source: Home / Self Care | Attending: Urology

## 2017-07-22 ENCOUNTER — Emergency Department (HOSPITAL_COMMUNITY): Payer: Self-pay

## 2017-07-22 ENCOUNTER — Inpatient Hospital Stay (HOSPITAL_COMMUNITY)
Admission: EM | Admit: 2017-07-22 | Discharge: 2017-07-27 | DRG: 694 | Disposition: A | Discharge status: Home / Self Care | Payer: Self-pay | Attending: Urology | Admitting: Urology

## 2017-07-22 ENCOUNTER — Encounter (HOSPITAL_COMMUNITY): Payer: Self-pay

## 2017-07-22 DIAGNOSIS — F329 Major depressive disorder, single episode, unspecified: Secondary | ICD-10-CM | POA: Diagnosis present

## 2017-07-22 DIAGNOSIS — N200 Calculus of kidney: Secondary | ICD-10-CM

## 2017-07-22 DIAGNOSIS — N179 Acute kidney failure, unspecified: Secondary | ICD-10-CM | POA: Diagnosis present

## 2017-07-22 DIAGNOSIS — E860 Dehydration: Secondary | ICD-10-CM | POA: Diagnosis present

## 2017-07-22 DIAGNOSIS — Z23 Encounter for immunization: Secondary | ICD-10-CM

## 2017-07-22 DIAGNOSIS — N132 Hydronephrosis with renal and ureteral calculous obstruction: Secondary | ICD-10-CM

## 2017-07-22 DIAGNOSIS — N12 Tubulo-interstitial nephritis, not specified as acute or chronic: Secondary | ICD-10-CM

## 2017-07-22 DIAGNOSIS — F17210 Nicotine dependence, cigarettes, uncomplicated: Secondary | ICD-10-CM | POA: Diagnosis present

## 2017-07-22 DIAGNOSIS — F419 Anxiety disorder, unspecified: Secondary | ICD-10-CM | POA: Diagnosis present

## 2017-07-22 DIAGNOSIS — I248 Other forms of acute ischemic heart disease: Secondary | ICD-10-CM | POA: Diagnosis present

## 2017-07-22 DIAGNOSIS — N202 Calculus of kidney with calculus of ureter: Principal | ICD-10-CM | POA: Diagnosis present

## 2017-07-22 DIAGNOSIS — Y936A Activity, physical games generally associated with school recess, summer camp and children: Secondary | ICD-10-CM

## 2017-07-22 DIAGNOSIS — E871 Hypo-osmolality and hyponatremia: Secondary | ICD-10-CM | POA: Diagnosis present

## 2017-07-22 DIAGNOSIS — F1010 Alcohol abuse, uncomplicated: Secondary | ICD-10-CM | POA: Diagnosis present

## 2017-07-22 DIAGNOSIS — F319 Bipolar disorder, unspecified: Secondary | ICD-10-CM | POA: Diagnosis present

## 2017-07-22 DIAGNOSIS — B9620 Unspecified Escherichia coli [E. coli] as the cause of diseases classified elsewhere: Secondary | ICD-10-CM | POA: Diagnosis present

## 2017-07-22 DIAGNOSIS — Z79899 Other long term (current) drug therapy: Secondary | ICD-10-CM

## 2017-07-22 DIAGNOSIS — N201 Calculus of ureter: Secondary | ICD-10-CM

## 2017-07-22 DIAGNOSIS — N1330 Unspecified hydronephrosis: Secondary | ICD-10-CM | POA: Diagnosis present

## 2017-07-22 HISTORY — PX: CYSTOSCOPY W/ URETERAL STENT PLACEMENT: SHX1429

## 2017-07-22 LAB — LACTIC ACID, PLASMA
Lactic Acid, Venous: 1.1 mmol/L (ref 0.5–1.9)
Lactic Acid, Venous: 1 mmol/L (ref 0.5–1.9)

## 2017-07-22 LAB — URINALYSIS, ROUTINE W REFLEX MICROSCOPIC
Bilirubin Urine: NEGATIVE
Glucose, UA: NEGATIVE mg/dL
Ketones, ur: 80 mg/dL — AB
Nitrite: NEGATIVE
Protein, ur: 100 mg/dL — AB
Specific Gravity, Urine: 1.024 (ref 1.005–1.030)
pH: 5 (ref 5.0–8.0)

## 2017-07-22 LAB — TROPONIN I: Troponin I: 0.08 ng/mL (ref <0.03)

## 2017-07-22 LAB — COMPREHENSIVE METABOLIC PANEL
ALT: 23 U/L (ref 14–54)
AST: 27 U/L (ref 15–41)
Albumin: 2.9 g/dL — ABNORMAL LOW (ref 3.5–5.0)
Alkaline Phosphatase: 115 U/L (ref 38–126)
Anion gap: 15 (ref 5–15)
BUN: 13 mg/dL (ref 6–20)
CO2: 18 mmol/L — ABNORMAL LOW (ref 22–32)
Calcium: 8.7 mg/dL — ABNORMAL LOW (ref 8.9–10.3)
Chloride: 99 mmol/L — ABNORMAL LOW (ref 101–111)
Creatinine, Ser: 1.17 mg/dL — ABNORMAL HIGH (ref 0.44–1.00)
GFR calc Af Amer: >60 mL/min (ref >60)
GFR calc non Af Amer: 52 mL/min — ABNORMAL LOW (ref >60)
Glucose, Bld: 117 mg/dL — ABNORMAL HIGH (ref 65–99)
Potassium: 3.5 mmol/L (ref 3.5–5.1)
Sodium: 132 mmol/L — ABNORMAL LOW (ref 135–145)
Total Bilirubin: 0.6 mg/dL (ref 0.3–1.2)
Total Protein: 8.1 g/dL (ref 6.5–8.1)

## 2017-07-22 LAB — CBC
HCT: 35.6 % — ABNORMAL LOW (ref 36.0–46.0)
Hemoglobin: 11.7 g/dL — ABNORMAL LOW (ref 12.0–15.0)
MCH: 29.9 pg (ref 26.0–34.0)
MCHC: 32.9 g/dL (ref 30.0–36.0)
MCV: 91 fL (ref 78.0–100.0)
Platelets: 256 10*3/uL (ref 150–400)
RBC: 3.91 MIL/uL (ref 3.87–5.11)
RDW: 14.2 % (ref 11.5–15.5)
WBC: 17.9 10*3/uL — ABNORMAL HIGH (ref 4.0–10.5)

## 2017-07-22 LAB — LIPASE, BLOOD: Lipase: 19 U/L (ref 11–51)

## 2017-07-22 SURGERY — CYSTOSCOPY, WITH RETROGRADE PYELOGRAM AND URETERAL STENT INSERTION
Anesthesia: General | Site: Bladder | Laterality: Left

## 2017-07-22 MED ORDER — SODIUM CHLORIDE 0.9 % IR SOLN
Status: DC | PRN
  Administered 2017-07-22: 3000 mL via INTRAVESICAL

## 2017-07-22 MED ORDER — DEXTROSE 5 % IV SOLN
1.0000 g | INTRAVENOUS | Status: DC
  Administered 2017-07-23 – 2017-07-25 (×3): 1 g via INTRAVENOUS
  Filled 2017-07-22 (×6): qty 10

## 2017-07-22 MED ORDER — SENNOSIDES-DOCUSATE SODIUM 8.6-50 MG PO TABS
1.0000 | ORAL_TABLET | Freq: Two times a day (BID) | ORAL | Status: DC
  Administered 2017-07-22 – 2017-07-26 (×9): 1 via ORAL
  Filled 2017-07-22 (×10): qty 1

## 2017-07-22 MED ORDER — MIDAZOLAM HCL 5 MG/5ML IJ SOLN
INTRAMUSCULAR | Status: DC | PRN
  Administered 2017-07-22: 2 mg via INTRAVENOUS

## 2017-07-22 MED ORDER — SODIUM CHLORIDE 0.9 % IV BOLUS (SEPSIS)
1000.0000 mL | Freq: Once | INTRAVENOUS | Status: AC
  Administered 2017-07-22: 1000 mL via INTRAVENOUS

## 2017-07-22 MED ORDER — OXYCODONE HCL 5 MG PO TABS
5.0000 mg | ORAL_TABLET | ORAL | Status: DC | PRN
  Administered 2017-07-23 – 2017-07-26 (×6): 5 mg via ORAL
  Filled 2017-07-22 (×6): qty 1

## 2017-07-22 MED ORDER — HYDROMORPHONE HCL 1 MG/ML IJ SOLN
1.0000 mg | Freq: Once | INTRAMUSCULAR | Status: AC
  Administered 2017-07-22: 1 mg via INTRAVENOUS
  Filled 2017-07-22: qty 1

## 2017-07-22 MED ORDER — HYDROMORPHONE HCL 1 MG/ML IJ SOLN: 0.5000 mg | INTRAMUSCULAR | Status: DC | PRN

## 2017-07-22 MED ORDER — 0.9 % SODIUM CHLORIDE (POUR BTL) OPTIME
TOPICAL | Status: DC | PRN
  Administered 2017-07-22: 1000 mL

## 2017-07-22 MED ORDER — DEXTROSE 5 % IV SOLN
1.0000 g | Freq: Once | INTRAVENOUS | Status: AC
  Administered 2017-07-22: 1 g via INTRAVENOUS
  Filled 2017-07-22: qty 10

## 2017-07-22 MED ORDER — GI COCKTAIL ~~LOC~~
30.0000 mL | Freq: Once | ORAL | Status: AC
  Administered 2017-07-22: 30 mL via ORAL
  Filled 2017-07-22: qty 30

## 2017-07-22 MED ORDER — HYDRALAZINE HCL 20 MG/ML IJ SOLN: 5.0000 mg | Freq: Once | INTRAMUSCULAR | Status: DC

## 2017-07-22 MED ORDER — SODIUM CHLORIDE 0.9 % IV SOLN
Freq: Once | INTRAVENOUS | Status: AC
  Administered 2017-07-22: 19:00:00 via INTRAVENOUS

## 2017-07-22 MED ORDER — ONDANSETRON HCL 4 MG/2ML IJ SOLN: 4.0000 mg | Freq: Four times a day (QID) | INTRAMUSCULAR | Status: DC | PRN

## 2017-07-22 MED ORDER — LIDOCAINE 2% (20 MG/ML) 5 ML SYRINGE
INTRAMUSCULAR | Status: AC
  Filled 2017-07-22: qty 5

## 2017-07-22 MED ORDER — OXYCODONE HCL 5 MG PO TABS: 5.0000 mg | ORAL_TABLET | Freq: Once | ORAL | Status: DC | PRN

## 2017-07-22 MED ORDER — LACTATED RINGERS IV SOLN
INTRAVENOUS | Status: DC
  Administered 2017-07-22: 20:00:00 via INTRAVENOUS

## 2017-07-22 MED ORDER — IOPAMIDOL (ISOVUE-300) INJECTION 61%
INTRAVENOUS | Status: AC
  Administered 2017-07-22: 100 mL
  Filled 2017-07-22: qty 100

## 2017-07-22 MED ORDER — ONDANSETRON HCL 4 MG/2ML IJ SOLN
4.0000 mg | Freq: Once | INTRAMUSCULAR | Status: AC
  Administered 2017-07-22: 4 mg via INTRAVENOUS
  Filled 2017-07-22: qty 2

## 2017-07-22 MED ORDER — ROCURONIUM BROMIDE 10 MG/ML (PF) SYRINGE
PREFILLED_SYRINGE | INTRAVENOUS | Status: AC
  Filled 2017-07-22: qty 10

## 2017-07-22 MED ORDER — SUCCINYLCHOLINE CHLORIDE 200 MG/10ML IV SOSY
PREFILLED_SYRINGE | INTRAVENOUS | Status: AC
  Filled 2017-07-22: qty 10

## 2017-07-22 MED ORDER — FENTANYL CITRATE (PF) 250 MCG/5ML IJ SOLN
INTRAMUSCULAR | Status: DC | PRN
  Administered 2017-07-22 (×3): 25 ug via INTRAVENOUS

## 2017-07-22 MED ORDER — PROPOFOL 10 MG/ML IV BOLUS
INTRAVENOUS | Status: DC | PRN
  Administered 2017-07-22: 150 mg via INTRAVENOUS

## 2017-07-22 MED ORDER — FENTANYL CITRATE (PF) 100 MCG/2ML IJ SOLN
25.0000 ug | INTRAMUSCULAR | Status: DC | PRN
  Administered 2017-07-22: 25 ug via INTRAVENOUS

## 2017-07-22 MED ORDER — IOPAMIDOL (ISOVUE-300) INJECTION 61%
INTRAVENOUS | Status: DC | PRN
  Administered 2017-07-22: 15 mL via URETHRAL

## 2017-07-22 MED ORDER — FENTANYL CITRATE (PF) 100 MCG/2ML IJ SOLN
INTRAMUSCULAR | Status: AC
  Filled 2017-07-22: qty 2

## 2017-07-22 MED ORDER — FENTANYL CITRATE (PF) 250 MCG/5ML IJ SOLN
INTRAMUSCULAR | Status: AC
  Filled 2017-07-22: qty 5

## 2017-07-22 MED ORDER — ONDANSETRON 4 MG PO TBDP
4.0000 mg | ORAL_TABLET | Freq: Once | ORAL | Status: AC | PRN
  Administered 2017-07-22: 4 mg via ORAL

## 2017-07-22 MED ORDER — KCL IN DEXTROSE-NACL 20-5-0.45 MEQ/L-%-% IV SOLN
INTRAVENOUS | Status: DC
  Administered 2017-07-22 – 2017-07-25 (×5): via INTRAVENOUS
  Filled 2017-07-22 (×7): qty 1000

## 2017-07-22 MED ORDER — OXYCODONE HCL 5 MG/5ML PO SOLN: 5.0000 mg | Freq: Once | ORAL | Status: DC | PRN

## 2017-07-22 MED ORDER — LIDOCAINE 2% (20 MG/ML) 5 ML SYRINGE
INTRAMUSCULAR | Status: DC | PRN
  Administered 2017-07-22: 60 mg via INTRAVENOUS

## 2017-07-22 MED ORDER — PROPOFOL 10 MG/ML IV BOLUS
INTRAVENOUS | Status: AC
  Filled 2017-07-22: qty 40

## 2017-07-22 MED ORDER — ONDANSETRON HCL 4 MG/2ML IJ SOLN
INTRAMUSCULAR | Status: DC | PRN
  Administered 2017-07-22: 4 mg via INTRAVENOUS

## 2017-07-22 MED ORDER — IOPAMIDOL (ISOVUE-300) INJECTION 61%
INTRAVENOUS | Status: AC
  Filled 2017-07-22: qty 50

## 2017-07-22 MED ORDER — ONDANSETRON HCL 4 MG/2ML IJ SOLN
INTRAMUSCULAR | Status: AC
  Filled 2017-07-22: qty 4

## 2017-07-22 MED ORDER — MIDAZOLAM HCL 2 MG/2ML IJ SOLN
INTRAMUSCULAR | Status: AC
  Filled 2017-07-22: qty 2

## 2017-07-22 MED ORDER — ONDANSETRON 4 MG PO TBDP
ORAL_TABLET | ORAL | Status: AC
  Filled 2017-07-22: qty 1

## 2017-07-22 SURGICAL SUPPLY — 31 items
ADAPTER CATH URET PLST 4-6FR (CATHETERS) IMPLANT
BAG URINE DRAINAGE (UROLOGICAL SUPPLIES) ×3 IMPLANT
BENZOIN TINCTURE PRP APPL 2/3 (GAUZE/BANDAGES/DRESSINGS) IMPLANT
BLADE 10 SAFETY STRL DISP (BLADE) ×3 IMPLANT
BLADE CLIPPER SURG (BLADE) IMPLANT
BUCKET BIOHAZARD WASTE 5 GAL (MISCELLANEOUS) ×3 IMPLANT
CATH FOLEY 2WAY SLVR  5CC 16FR (CATHETERS)
CATH FOLEY 2WAY SLVR 5CC 16FR (CATHETERS) IMPLANT
CATH URET 5FR 28IN CONE TIP (BALLOONS)
CATH URET 5FR 28IN OPEN ENDED (CATHETERS) ×3 IMPLANT
CATH URET 5FR 70CM CONE TIP (BALLOONS) IMPLANT
COVER SURGICAL LIGHT HANDLE (MISCELLANEOUS) ×3 IMPLANT
DRAPE CAMERA CLOSED 9X96 (DRAPES) ×3 IMPLANT
GLOVE BIO SURGEON STRL SZ7.5 (GLOVE) ×3 IMPLANT
GOWN STRL REUS W/ TWL XL LVL3 (GOWN DISPOSABLE) ×2 IMPLANT
GOWN STRL REUS W/TWL XL LVL3 (GOWN DISPOSABLE) ×4
GUIDEWIRE COOK  .035 (WIRE) ×3 IMPLANT
GUIDEWIRE STR DUAL SENSOR (WIRE) IMPLANT
H R LUBE JELLY XXX (MISCELLANEOUS) ×3 IMPLANT
KIT ROOM TURNOVER OR (KITS) ×3 IMPLANT
NS IRRIG 1000ML POUR BTL (IV SOLUTION) ×3 IMPLANT
PACK CYSTO (CUSTOM PROCEDURE TRAY) ×3 IMPLANT
PAD ARMBOARD 7.5X6 YLW CONV (MISCELLANEOUS) ×6 IMPLANT
PLUG CATH AND CAP STER (CATHETERS) IMPLANT
STENT URET 6FRX24 CONTOUR (STENTS) ×3 IMPLANT
SYR 20CC LL (SYRINGE) ×3 IMPLANT
SYR CONTROL 10ML LL (SYRINGE) ×3 IMPLANT
SYRINGE TOOMEY DISP (SYRINGE) IMPLANT
UNDERPAD 30X30 (UNDERPADS AND DIAPERS) ×3 IMPLANT
WATER STERILE IRR 1000ML POUR (IV SOLUTION) ×3 IMPLANT
WIRE COONS/BENSON .038X145CM (WIRE) IMPLANT

## 2017-07-22 NOTE — Brief Op Note (Signed)
07/22/2017  8:49 PM  PATIENT:  Kimberly [name]  56 y.o. female  PRE-OPERATIVE DIAGNOSIS:  Left Ureteral stone  POST-OPERATIVE DIAGNOSIS:  Left Ureteral stone  PROCEDURE:  Procedure(s): CYSTOSCOPY WITH RETROGRADE PYELOGRAM/URETERAL STENT PLACEMENT (Left)  SURGEON:  Surgeon(s) and Role:    Alexis Frock, MD - Primary  PHYSICIAN ASSISTANT:   ASSISTANTS: none   ANESTHESIA:   general  EBL:  Total I/O In: 450 [I.V.:450] Out: 10 [Blood:10]  BLOOD ADMINISTERED:none  DRAINS: none   LOCAL MEDICATIONS USED:  NONE  SPECIMEN:  No Specimen  DISPOSITION OF SPECIMEN:  N/A  COUNTS:  YES  TOURNIQUET:  * No tourniquets in log *  DICTATION: .Other Dictation: Dictation Number [id number]  PLAN OF CARE: Admit for overnight observation  PATIENT DISPOSITION:  PACU - hemodynamically stable.   Delay start of Pharmacological VTE agent (>24hrs) due to surgical blood loss or risk of bleeding: yes
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
## 2017-07-22 NOTE — Anesthesia Preprocedure Evaluation (Signed)
Anesthesia Evaluation  Patient identified by MRN, date of birth, ID band Patient awake    Reviewed: Allergy & Precautions, H&P , NPO status , Patient's Chart, lab work & pertinent test results  Airway Mallampati: II   Neck ROM: full    Dental   Pulmonary Current Smoker,    breath sounds clear to auscultation       Cardiovascular negative cardio ROS   Rhythm:regular Rate:Normal     Neuro/Psych PSYCHIATRIC DISORDERS Anxiety Depression Bipolar Disorder    GI/Hepatic   Endo/Other    Renal/GU    Ureteral stone    Musculoskeletal   Abdominal   Peds  Hematology   Anesthesia Other Findings   Reproductive/Obstetrics                             Anesthesia Physical Anesthesia Plan  ASA: II  Anesthesia Plan: General   Post-op Pain Management:    Induction: Intravenous  PONV Risk Score and Plan: 3 and Ondansetron, Dexamethasone, Midazolam and Treatment may vary due to age or medical condition  Airway Management Planned: LMA  Additional Equipment:   Intra-op Plan:   Post-operative Plan:   Informed Consent: I have reviewed the patients History and Physical, chart, labs and discussed the procedure including the risks, benefits and alternatives for the proposed anesthesia with the patient or authorized representative who has indicated his/her understanding and acceptance.     Plan Discussed with: CRNA, Surgeon and Anesthesiologist  Anesthesia Plan Comments:         Anesthesia Quick Evaluation
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
## 2017-07-22 NOTE — ED Triage Notes (Signed)
Pt presents for evaluation of generalized abd pain x 3 days with n/v/d. Pt denies dysuria or vaginal discharge. Reports urine is dark. Pt reports pain is worse in the evenings.

## 2017-07-22 NOTE — Transfer of Care (Signed)
Immediate Anesthesia Transfer of Care Note  Patient: Kimberly [name]  Procedure(s) Performed: Procedure(s): CYSTOSCOPY WITH RETROGRADE PYELOGRAM/URETERAL STENT PLACEMENT (Left)  Patient Location: PACU  Anesthesia Type:General  Level of Consciousness: awake, alert , oriented and patient cooperative  Airway & Oxygen Therapy: Patient Spontanous Breathing and Patient connected to nasal cannula oxygen  Post-op Assessment: Report given to RN, Post -op Vital signs reviewed and stable and Patient moving all extremities  Post vital signs: Reviewed and stable  Last Vitals:  Vitals:   07/22/17 1900 07/22/17 2100  BP: 118/72   Pulse: 92   Resp: 20   Temp:  (!) 36.2 C  SpO2: 94%     Last Pain:  Vitals:   07/22/17 1858  TempSrc:   PainSc: 7          Complications: No apparent anesthesia complications
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
## 2017-07-22 NOTE — H&P (Signed)
Kimberly [name] is an 56 y.o. female.    Chief Complaint: Left Ureteral Stone, Febrile Urinary Tract Infection  HPI:   1 - Left Ureteral Stone - 22m left proximal solitary stone with mod-severe hydro by CT 06/2017 on eval abdominal pain. Stone is solitary, 97m SSD 14cm and 1400HU. No prior stones.  2- Febrile Urinary Tract Infection - low grade fever to 100, leukocytosis to 17k, bacteruria, tachycardia c/w early pyelonephritis. UCX 8/28 pending. Placed on empiric Rocephin. Cr 1.17.   PMH sig for Bipolar (fair functional baseline). No PCP. No ischemic CV disease / blood thinners. No prior surgeries.   Today "Kimberly [name] seen for above. Last meal >24 hrs ago, nothing to drink in >7hrs.   Past Medical History:  Diagnosis Date  . Anxiety   . B[place] 1 disorder [place]  . Depression   . Thyroid disease     Past Surgical History:  Procedure Laterality Date  . ACROMIO-CLAVICULAR JOINT REPAIR Left 02/24/2015   Procedure: LEFT ACROMIO-CLAVICULAR JOINT RECONSTRUCTION;  Surgeon: NaLeandrew KoyanagiMD;  [place] [facility] Service: [place];  Laterality: Left;  . ORIF MANDIBULAR FRACTURE N/A 03/22/2016   Procedure: OPEN REDUCTION INTERNAL FIXATION (ORIF) MANDIBULAR FRACTURE;  Surgeon: JoMelissa MontaneMD;  [place] [facility] Service: E[place];  Laterality: N/A;    No family history on file. Social History:  reports that she has been smoking Cigarettes.  She has never used smokeless tobacco. She reports that she drinks alcohol. She reports that she uses drugs.  Allergies: No Known Allergies   (Not in a hospital admission)  Results for orders placed or performed during the hospital encounter of 07/22/17 (from the past 48 hour(s))  Lipase, blood     Status: None   Collection Time: 07/22/17 11:45 AM  Result Value Ref Range   Lipase 19 11 - 51 U/L  Comprehensive metabolic panel     Status: Abnormal   Collection Time: 07/22/17 11:45 AM  Result Value Ref Range   Sodium 132 (L) 135 - 145 mmol/L   Potassium  3.5 3.5 - 5.1 mmol/L   Chloride 99 (L) 101 - 111 mmol/L   CO2 18 (L) 22 - 32 mmol/L   Glucose, Bld 117 (H) 65 - 99 mg/dL   BUN 13 6 - 20 mg/dL   Creatinine, Ser 1.17 (H) 0.44 - 1.00 mg/dL   Calcium 8.7 (L) 8.9 - 10.3 mg/dL   Total Protein 8.1 6.5 - 8.1 g/dL   Albumin 2.9 (L) 3.5 - 5.0 g/dL   AST 27 15 - 41 U/L   ALT 23 14 - 54 U/L   Alkaline Phosphatase 115 38 - 126 U/L   Total Bilirubin 0.6 0.3 - 1.2 mg/dL   GFR calc non Af Amer 52 (L) >60 mL/min   GFR calc Af Amer >60 >60 mL/min    Comment: (NOTE) The eGFR has been calculated using the CKD EPI equation. This calculation has not been validated in all clinical situations. eGFR's persistently <60 mL/min signify possible Chronic Kidney Disease.    Anion gap 15 5 - 15  CBC     Status: Abnormal   Collection Time: 07/22/17 11:45 AM  Result Value Ref Range   WBC 17.9 (H) 4.0 - 10.5 K/uL   RBC 3.91 3.87 - 5.11 MIL/uL   Hemoglobin 11.7 (L) 12.0 - 15.0 g/dL   HCT 35.6 (L) 36.0 - 46.0 %   MCV 91.0 78.0 - 100.0 fL   MCH 29.9 26.0 - 34.0 pg  MCHC 32.9 30.0 - 36.0 g/dL   RDW 14.2 11.5 - 15.5 %   Platelets 256 150 - 400 K/uL  Urinalysis, Routine w reflex microscopic     Status: Abnormal   Collection Time: 07/22/17 11:53 AM  Result Value Ref Range   Color, Urine AMBER (A) YELLOW    Comment: BIOCHEMICALS MAY BE AFFECTED BY COLOR   APPearance HAZY (A) CLEAR   Specific Gravity, Urine 1.024 1.005 - 1.030   pH 5.0 5.0 - 8.0   Glucose, UA NEGATIVE NEGATIVE mg/dL   Hgb urine dipstick SMALL (A) NEGATIVE   Bilirubin Urine NEGATIVE NEGATIVE   Ketones, ur 80 (A) NEGATIVE mg/dL   Protein, ur 100 (A) NEGATIVE mg/dL   Nitrite NEGATIVE NEGATIVE   Leukocytes, UA TRACE (A) NEGATIVE   RBC / HPF 0-5 0 - 5 RBC/hpf   WBC, UA 6-30 0 - 5 WBC/hpf   Bacteria, UA RARE (A) NONE SEEN   Squamous Epithelial / LPF 6-30 (A) NONE SEEN   Mucus PRESENT   Troponin I     Status: Abnormal   Collection Time: 07/22/17  3:59 PM  Result Value Ref Range   Troponin I  0.08 (HH) <0.03 ng/mL    Comment: CRITICAL RESULT CALLED TO, READ BACK BY AND VERIFIED WITH: C HUGHES,RN 1727 07/22/2017 WBOND   Lactic acid, plasma     Status: None   Collection Time: 07/22/17  3:59 PM  Result Value Ref Range   Lactic Acid, Venous 1.1 0.5 - 1.9 mmol/L   Ct Abdomen Pelvis W Contrast  Result Date: 07/22/2017 CLINICAL DATA:  Pt presents for evaluation of generalized abd pain x 3 days with n/v/d. Pt denies dysuria or vaginal discharge. Reports urine is dark. Pt reports pain is worse in the evenings. EXAM: CT ABDOMEN AND PELVIS WITH CONTRAST TECHNIQUE: Multidetector CT imaging of the abdomen and pelvis was performed using the standard protocol following bolus administration of intravenous contrast. CONTRAST:  159m ISOVUE-300 IOPAMIDOL (ISOVUE-300) INJECTION 61% COMPARISON:  04/27/2016 FINDINGS: Lower chest: No acute findings. Mild cardiomegaly. Dependent subsegmental atelectasis. Hepatobiliary: 12 mm low-density lesion in the anterior medial segment of the left liver lobe, segment 4A, consistent with a cyst and stable from the prior CT. No other liver abnormality. Normal gallbladder. No bile duct dilation. Pancreas: Unremarkable. No pancreatic ductal dilatation or surrounding inflammatory changes. Spleen: Normal in size without focal abnormality. Adrenals/Urinary Tract: No adrenal masses. Left kidney is swollen with heterogeneous enhancement. There is moderate hydronephrosis caused by a 9 mm stone in the proximal ureter. The stone was present in the lower pole of the left kidney on the prior CT. Within the kidney there are ill-defined areas of decreased attenuation most evident along the posteromedial upper and mid poles. There is associated perinephric stranding. No right renal mass. Right kidney is unremarkable. There is no right intrarenal stone or hydronephrosis. Normal right ureter. Bladder is unremarkable. Stomach/Bowel: Stomach is within normal limits. Appendix appears normal. No  evidence of bowel wall thickening, distention, or inflammatory changes. Vascular/Lymphatic: Aortic atherosclerosis. No enlarged abdominal or pelvic lymph nodes. Reproductive: Uterine fibroids, the largest subserosal and partly calcified protruding posteriorly measuring 3.3 cm. No adnexal masses. Other: No abdominal wall hernia or abnormality. No abdominopelvic ascites. Musculoskeletal: No acute or significant osseous findings. IMPRESSION: 1. 9 mm stone lies in the proximal left ureter causing moderate hydronephrosis. In addition to obstruction, there are ill-defined areas of hypoattenuation within the left kidney suggesting pyelonephritis in the proper clinical setting. 2. No other acute  abnormalities. 3. Aortic atherosclerosis. Electronically Signed   By: Lajean Manes M.D.   On: 07/22/2017 17:22    Review of Systems  Constitutional: Positive for fever and malaise/fatigue.  HENT: Negative.   Eyes: Negative.   Respiratory: Negative.   Cardiovascular: Negative.   Gastrointestinal: Positive for nausea.  Genitourinary: Positive for flank pain.  Skin: Negative.   Neurological: Negative.   Endo/Heme/Allergies: Negative.   Psychiatric/Behavioral: Negative.     Blood pressure 126/69, pulse 88, temperature 99.3 F (37.4 C), temperature source Oral, resp. rate (!) 21, height '5\' 7"'  (1.702 m), weight 72.6 kg (160 lb), last menstrual period 10/09/2012, SpO2 94 %. Physical Exam  Constitutional: She appears well-developed.  HENT:  Head: Normocephalic.  Poor dentition  Eyes: Pupils are equal, round, and reactive to light.  Neck: Normal range of motion.  Cardiovascular:  Mild regular tachycardia.   Respiratory: Effort normal.  GI: Soft.  Genitourinary:  Genitourinary Comments: Mild left CVAT at present.   Neurological: She is alert.  Blunted affect  Skin: Skin is warm.     Assessment/Plan   1 - Left Ureteral Stone - rec cysto left retragrade and stent today for renal decompression and allow  clear infection followed by elective ureteroscopy in outpatient setting in few weeks as safest way to proceed.  Risks, benefits, alternatives, need for staged approach discussed. Admit post-op and DC when prelim CX results and afebrile x 24hrs.   NPO, IVF, Continue empiric rocephin.  2- Febrile Urinary Tract Infection - plan as per above.  Alexis Frock, MD 07/22/2017, 7:01 PM

## 2017-07-22 NOTE — Anesthesia Procedure Notes (Signed)
Procedure Name: LMA Insertion Date/Time: 07/22/2017 8:32 PM Performed by: Myna Bright Pre-anesthesia Checklist: Patient identified, Emergency Drugs available, Suction available and Patient being monitored Patient Re-evaluated:Patient Re-evaluated prior to induction Oxygen Delivery Method: Circle system utilized Preoxygenation: Pre-oxygenation with 100% oxygen Induction Type: IV induction Ventilation: Mask ventilation without difficulty LMA: LMA inserted LMA Size: 4.0 Tube type: Oral Number of attempts: 1 Placement Confirmation: positive ETCO2 and breath sounds checked- equal and bilateral Tube secured with: Tape Dental Injury: Teeth and Oropharynx as per pre-operative assessment

## 2017-07-22 NOTE — Op Note (Signed)
Kimberly [name], Kimberly [name] NO.:  [id number]  MEDICAL RECORD NO.:  [id number]  LOCATION:  A09C                         FACILITY:  [facility]  PHYSICIAN:  Alexis Frock, MD     DATE OF BIRTH:  January 27, 1961  DATE OF PROCEDURE: 07/22/2017                               OPERATIVE REPORT   DIAGNOSIS:  Left ureteral stone and febrile urinary infection.  PROCEDURES: 1. Cystoscopy with left retrograde pyelogram and interpretation. 2. Left ureteral stent placement, 6 x 24 Contour, no tether.  ESTIMATED BLOOD LOSS:  Nil.  COMPLICATIONS:  None.  SPECIMEN:  None.  FINDINGS: 1. Moderate-to-severe left hydronephrosis with filling defect in the     proximal ureter consistent with known stone. 2. Successful placement of left ureteral stent, proximal end in the     renal pelvis and distal end in the urinary bladder with efflux of     copious purulent-appearing urine after placement.  INDICATION:  Kimberly [name] is a 56 year old lady with history of psychiatric disease, who was found on workup of abdominal pain to have a left proximal ureteral stone.  She was also febrile, tachycardic with leukocytosis and bacteriuria worrisome for impending obstructing urosepsis.  It was felt that urgent renal decompression was warranted. Options were discussed including nephrostomy versus stent.  She wished to proceed with trial of stenting.  Informed consent was obtained and placed in the medical record.  PROCEDURE IN DETAIL:  The patient being Kimberly [name], was verified. Procedure being left ureteral stent placement was confirmed.  Procedure was carried out.  Time-out was performed.  Intravenous antibiotics were administered.  General LMA anesthesia was introduced.  The patient was placed into a low lithotomy position.  Sterile field was created by prepping and draping the patient's vagina, introitus, and proximal thighs using iodine.  Next, cystourethroscopy was performed using a rigid  cystoscope with offset lens.  Inspection of the urinary bladder revealed some mild erythema and proteinaceous urine consistent with likely bacteriuria.  The left ureteral orifice was cannulated with a 6- French end-hole catheter and left retrograde pyelogram was obtained.  Left retrograde pyelogram demonstrated a single left ureter with single- system left kidney.  There was moderate-to-severe hydronephrosis to a mobile filling defect in the proximal ureter consistent with known stone.  A 0.038 Zip wire was advanced to the upper pole, over which, a new 6 x 24 Contour-type stent was placed.  Good proximal and distal deployment were noted.  Efflux of copious purulent-appearing urine was seen around into the distal end of the stent.  Bladder was emptied per cystoscope.  Procedure was then terminated.  The patient tolerated the procedure well.  There were no periprocedural complications.  The patient was taken to the postanesthesia care unit in stable condition.          ______________________________ Alexis Frock, MD     TM/MEDQ  D:  07/22/2017  T:  07/22/2017  Job:  [id number]

## 2017-07-22 NOTE — Interval H&P Note (Signed)
History and Physical Interval Note:  07/22/2017 8:19 PM  Kimberly [name]  has presented today for surgery, with the diagnosis of Left Ureteral stone  The various methods of treatment have been discussed with the patient and family. After consideration of risks, benefits and other options for treatment, the patient has consented to  Procedure(s): CYSTOSCOPY WITH RETROGRADE PYELOGRAM/URETERAL STENT PLACEMENT (Left) as a surgical intervention .  The patient's history has been reviewed, patient examined, no change in status, stable for surgery.  I have reviewed the patient's chart and labs.  Questions were answered to the patient's satisfaction.     Brittay Mogle

## 2017-07-23 ENCOUNTER — Encounter (HOSPITAL_COMMUNITY): Payer: Self-pay | Admitting: Urology

## 2017-07-23 MED ORDER — ACETAMINOPHEN 500 MG PO TABS
1000.0000 mg | ORAL_TABLET | Freq: Three times a day (TID) | ORAL | Status: DC
  Administered 2017-07-23 – 2017-07-27 (×12): 1000 mg via ORAL
  Filled 2017-07-23 (×12): qty 2

## 2017-07-23 MED ORDER — PNEUMOCOCCAL VAC POLYVALENT 25 MCG/0.5ML IJ INJ
0.5000 mL | INJECTION | INTRAMUSCULAR | Status: AC
  Administered 2017-07-24: 0.5 mL via INTRAMUSCULAR
  Filled 2017-07-23: qty 0.5

## 2017-07-23 NOTE — Progress Notes (Signed)
Patient has a temp of 103.1,Manny,MD notified. Orders received in epic. Will continue to monitor. Alvita Fana, Wonda Cheng, Therapist, sports

## 2017-07-23 NOTE — Progress Notes (Signed)
New Admission Note:   Arrival Method: Patient arrived from PACU via stretcher Mental Orientation: Alert and oriented x4 Telemetry: Box #9 Assessment: Completed Skin: See doc flowsheet IV: Rt AC  Pain: Denies Tubes: N/A Safety Measures: Safety Fall Prevention Plan has been given, discussed  Admission: Completed [facility] Orientation: Patient has been orientated to the room, unit and staff.  Family: None at bedside  Orders have been reviewed and implemented. Will continue to monitor the patient. Call light has been placed within reach and bed alarm has been activated.   [employer], RN-BC Phone number: [phone]

## 2017-07-23 NOTE — Progress Notes (Signed)
1 Day Post-Op   Subjective/Chief Complaint:  1 - Left Ureteral Stone - s/p left JJ stent (6x24 contour) for 66mm left proximal solitary stone 07/22/2017 with mod-severe hydro by CT 06/2017 on eval abdominal pain. Stone is solitary, 34mm, SSD 14cm and 1400HU. No prior stones.  2- Febrile Urinary Tract Infection - low grade fever to 100, leukocytosis to 17k, bacteruria, tachycardia c/w early pyelonephritis. UCX and [place] 8/28 pending. Placed on empiric Rocephin. Cr 1.17.  Today "Kimberly [name]" is stable. Had high grade fever spike earlier today that responded well to tylenol. BCX, UCX still pending.    Objective: Vital signs in last 24 hours: Temp:  [97.2 F (36.2 C)-103.1 F (39.5 C)] 98.9 F (37.2 C) (08/29 1700) Pulse Rate:  [76-109] 81 (08/29 1700) Resp:  [12-23] 16 (08/29 1700) BP: (108-131)/(61-83) 113/62 (08/29 1700) SpO2:  [94 %-99 %] 97 % (08/29 1700) Weight:  [72.6 kg (160 lb)] 72.6 kg (160 lb) (08/28 2258) Last BM Date: 07/22/17  Intake/Output from previous day: 08/28 0701 - 08/29 0700 In: 2362.5 [P.O.:360; I.V.:952.5; IV Piggyback:1050] Out: 485 [Urine:475; Blood:10] Intake/Output this shift: Total I/O In: 1080 [P.O.:480; I.V.:600] Out: 1900 [Urine:1900]  General appearance: alert, cooperative and blut affect as per baseline.  Eyes: negative Nose: Nares normal. Septum midline. Mucosa normal. No drainage or sinus tenderness. Throat: lips, mucosa, and tongue normal; teeth and gums normal Neck: supple, symmetrical, trachea midline Back: symmetric, no curvature. ROM normal. No CVA tenderness. Resp: non-labored on room air.  Cardio: Nl rate at present.  GI: soft, non-tender; bowel sounds normal; no masses,  no organomegaly Extremities: extremities normal, atraumatic, no cyanosis or edema Pulses: 2+ and symmetric Lymph nodes: Cervical, supraclavicular, and axillary nodes normal. Neurologic: Grossly normal  Lab Results:   Recent Labs  07/22/17 1145  WBC 17.9*  HGB 11.7*   HCT 35.6*  PLT 256   BMET  Recent Labs  07/22/17 1145  NA 132*  K 3.5  CL 99*  CO2 18*  GLUCOSE 117*  BUN 13  CREATININE 1.17*  CALCIUM 8.7*   PT/INR No results for input(s): LABPROT, INR in the last 72 hours. ABG No results for input(s): PHART, HCO3 in the last 72 hours.  Invalid input(s): PCO2, PO2  Studies/Results: Ct Abdomen Pelvis W Contrast  Result Date: 07/22/2017 CLINICAL DATA:  Pt presents for evaluation of generalized abd pain x 3 days with n/v/d. Pt denies dysuria or vaginal discharge. Reports urine is dark. Pt reports pain is worse in the evenings. EXAM: CT ABDOMEN AND PELVIS WITH CONTRAST TECHNIQUE: Multidetector CT imaging of the abdomen and pelvis was performed using the standard protocol following bolus administration of intravenous contrast. CONTRAST:  165mL ISOVUE-300 IOPAMIDOL (ISOVUE-300) INJECTION 61% COMPARISON:  04/27/2016 FINDINGS: Lower chest: No acute findings. Mild cardiomegaly. Dependent subsegmental atelectasis. Hepatobiliary: 12 mm low-density lesion in the anterior medial segment of the left liver lobe, segment 4A, consistent with a cyst and stable from the prior CT. No other liver abnormality. Normal gallbladder. No bile duct dilation. Pancreas: Unremarkable. No pancreatic ductal dilatation or surrounding inflammatory changes. Spleen: Normal in size without focal abnormality. Adrenals/Urinary Tract: No adrenal masses. Left kidney is swollen with heterogeneous enhancement. There is moderate hydronephrosis caused by a 9 mm stone in the proximal ureter. The stone was present in the lower pole of the left kidney on the prior CT. Within the kidney there are ill-defined areas of decreased attenuation most evident along the posteromedial upper and mid poles. There is associated perinephric stranding. No right  renal mass. Right kidney is unremarkable. There is no right intrarenal stone or hydronephrosis. Normal right ureter. Bladder is unremarkable. Stomach/Bowel:  Stomach is within normal limits. Appendix appears normal. No evidence of bowel wall thickening, distention, or inflammatory changes. Vascular/Lymphatic: Aortic atherosclerosis. No enlarged abdominal or pelvic lymph nodes. Reproductive: Uterine fibroids, the largest subserosal and partly calcified protruding posteriorly measuring 3.3 cm. No adnexal masses. Other: No abdominal wall hernia or abnormality. No abdominopelvic ascites. Musculoskeletal: No acute or significant osseous findings. IMPRESSION: 1. 9 mm stone lies in the proximal left ureter causing moderate hydronephrosis. In addition to obstruction, there are ill-defined areas of hypoattenuation within the left kidney suggesting pyelonephritis in the proper clinical setting. 2. No other acute abnormalities. 3. Aortic atherosclerosis. Electronically Signed   By: Lajean Manes M.D.   On: 07/22/2017 17:22   Dg Retrograde Pyelogram  Result Date: 07/23/2017 CLINICAL DATA:  56 year old female with obstructing left proximal ureteral stone EXAM: RETROGRADE PYELOGRAM COMPARISON:  CT scan of the abdomen and pelvis 07/22/2017 FINDINGS: Four intraoperative saved images are submitted for review. The images demonstrate opacification of the left ureter. The mid and distal ureter are within normal limits. There is a focal obstruction in the proximal ureter consistent with known ureteral stone. On the final image, there is successful placement of a double-J ureteral stent with the proximal loop appropriately reconstituted in overlying the renal pelvis in the distal loop overlying the urinary bladder. There is marked hydronephrosis of the partially opacified collecting system. Dystrophic calcifications overlie the anatomic pelvis likely within a degenerated uterine fibroid. No evidence of extravasation of contrast or other complicating feature. IMPRESSION: 1. Proximal ureteral obstruction with significant left-sided hydronephrosis secondary to known stone. 2. Successful  placement of a double-J ureteral stent. Electronically Signed   By: Jacqulynn Cadet M.D.   On: 07/23/2017 08:34    Anti-infectives: Anti-infectives    Start     Dose/Rate Route Frequency Ordered Stop   07/23/17 1800  cefTRIAXone (ROCEPHIN) 1 g in dextrose 5 % 50 mL IVPB     1 g 100 mL/hr over 30 Minutes Intravenous Every 24 hours 07/22/17 2110     07/22/17 1815  cefTRIAXone (ROCEPHIN) 1 g in dextrose 5 % 50 mL IVPB     1 g 100 mL/hr over 30 Minutes Intravenous  Once 07/22/17 1812 07/22/17 1847      Assessment/Plan:  1 - Left Ureteral Stone - now temporized with stent, rec ureteroscopy in elective setting in several weeks after clears infectious parameters.   2- Febrile Urinary Tract Infection - continue Rocephin pending further CX's.   Remain in house, discussed again goals for discharge includign prelim CX data and afebrile x 24 hrs.    [facility], Czar Ysaguirre 07/23/2017

## 2017-07-24 NOTE — Anesthesia Postprocedure Evaluation (Signed)
Anesthesia Post Note  Patient: Kimberly [name]  Procedure([name]) Performed: Procedure([name]) (LRB): CYSTOSCOPY WITH RETROGRADE PYELOGRAM/URETERAL STENT PLACEMENT (Left)     Patient location during evaluation: PACU Anesthesia Type: General Level of consciousness: awake and alert Pain management: pain level controlled Vital Signs Assessment: post-procedure vital signs reviewed and stable Respiratory status: spontaneous breathing, nonlabored ventilation, respiratory function stable and patient connected to nasal cannula oxygen Cardiovascular status: blood pressure returned to baseline and stable Postop Assessment: no signs of nausea or vomiting Anesthetic complications: no    Last Vitals:  Vitals:   07/23/17 2158 07/24/17 0623  BP: 121/70 (!) 119/57  Pulse: 72 87  Resp: 19 18  Temp: 37.1 C 37.8 C  SpO2: 96% 97%    Last Pain:  Vitals:   07/24/17 0623  TempSrc: Oral  PainSc:                  Kimberly [name]

## 2017-07-24 NOTE — Progress Notes (Signed)
2 Days Post-Op   Subjective/Chief Complaint:  1 - Left Ureteral Stone - s/p left JJ stent (6x24 contour) for 6mm left proximal solitary stone 07/22/2017 with mod-severe hydro by CT 06/2017 on eval abdominal pain. Stone is solitary, 77mm, SSD 14cm and 1400HU. No prior stones.  2- Febrile Urinary Tract Infection - low grade fever to 100, leukocytosis to 17k, bacteruria, tachycardia c/w early pyelonephritis. UCX 8/28 e. Coli / pending and BCX 8/28 NGTD/pending. Placed on empiric Rocephin. Cr 1.17.  Today "Kimberly [name]" is without complaints. Fever curve appears to be trending down. UCX now with e. Coli / final sensitivities pending.     Objective: Vital signs in last 24 hours: Temp:  [98.8 F (37.1 C)-100.1 F (37.8 C)] 99.8 F (37.7 C) (08/30 1014) Pulse Rate:  [72-87] 86 (08/30 1014) Resp:  [16-19] 18 (08/30 1014) BP: (113-125)/(57-70) 125/67 (08/30 1014) SpO2:  [96 %-98 %] 98 % (08/30 1014) Last BM Date: 07/22/17  Intake/Output from previous day: 08/29 0701 - 08/30 0700 In: 3003.8 [P.O.:1200; I.V.:1753.8; IV Piggyback:50] Out: 3400 [Urine:3400] Intake/Output this shift: Total I/O In: 220 [P.O.:220] Out: 400 [Urine:400]  General appearance: alert, cooperative and blut affect as per baseline.  Eyes: negative Nose: Nares normal. Septum midline. Mucosa normal. No drainage or sinus tenderness. Throat: lips, mucosa, and tongue normal; teeth and gums normal Neck: supple, symmetrical, trachea midline Back: symmetric, no curvature. ROM normal. No CVA tenderness. Resp: non-labored on room air.  Cardio: Nl rate at present.  GI: soft, non-tender; bowel sounds normal; no masses,  no organomegaly Extremities: extremities normal, atraumatic, no cyanosis or edema Pulses: 2+ and symmetric Lymph nodes: Cervical, supraclavicular, and axillary nodes normal. Neurologic: Grossly normal  Lab Results:   Recent Labs  07/22/17 1145  WBC 17.9*  HGB 11.7*  HCT 35.6*  PLT 256   BMET  Recent  Labs  07/22/17 1145  NA 132*  K 3.5  CL 99*  CO2 18*  GLUCOSE 117*  BUN 13  CREATININE 1.17*  CALCIUM 8.7*   PT/INR No results for input(s): LABPROT, INR in the last 72 hours. ABG No results for input(s): PHART, HCO3 in the last 72 hours.  Invalid input(s): PCO2, PO2  Assessment/Plan:  1 - Left Ureteral Stone - now temporized with stent, rec ureteroscopy in elective setting in several weeks after clears infectious parameters.   2- Febrile Urinary Tract Infection - continue Rocephin pending final sensitivites from UCX as to plan DC ABX.    Remain in house, discussed again goals for discharge includign prelim CX data and afebrile x 24 hrs. Possibly home tomorrow.   [facility], Mikena Masoner 07/24/2017

## 2017-07-25 LAB — URINE CULTURE: Culture: >=100000 — AB

## 2017-07-25 NOTE — Progress Notes (Signed)
3 Days Post-Op   Subjective/Chief Complaint:  1 - Left Ureteral Stone - s/p left JJ stent (6x24 contour) for 1mm left proximal solitary stone 07/22/2017 with mod-severe hydro by CT 06/2017 on eval abdominal pain. Stone is solitary, 70mm, SSD 14cm and 1400HU. No prior stones.  2- Febrile Urinary Tract Infection - low grade fever to 100, leukocytosis to 17k, bacteruria, tachycardia c/w early pyelonephritis. UCX 8/28 e. Coli  sens cipro, rocephin. and [facility] 8/28 NGTD/pending. Placed on empiric Rocephin. Cr 1.17.  Today "Kimberly [name]" is stable. Still having some fever spikes. UCX confrims e. Coli sens to rocephin which she is on.   Objective: Vital signs in last 24 hours: Temp:  [98.5 F (36.9 C)-99.1 F (37.3 C)] 98.6 F (37 C) (08/31 1741) Pulse Rate:  [66-83] 66 (08/31 1741) Resp:  [17-18] 18 (08/31 1741) BP: (111-148)/(53-83) 148/81 (08/31 1741) SpO2:  [98 %-99 %] 99 % (08/31 1741) Last BM Date: 07/22/17  Intake/Output from previous day: 08/30 0701 - 08/31 0700 In: 2521.3 [P.O.:760; I.V.:1761.3] Out: 2800 [Urine:2800] Intake/Output this shift: Total I/O In: -  Out: 300 [Urine:300]  General appearance: alert, cooperative and blut affect as per baseline. In somewhat better spirits today.  Eyes: negative Nose: Nares normal. Septum midline. Mucosa normal. No drainage or sinus tenderness. Throat: lips, mucosa, and tongue normal; teeth and gums normal Neck: supple, symmetrical, trachea midline Back: symmetric, no curvature. ROM normal. No CVA tenderness. Resp: non-labored on room air.  Cardio: Nl rate at present.  GI: soft, non-tender; bowel sounds normal; no masses,  no organomegaly Extremities: extremities normal, atraumatic, no cyanosis or edema Pulses: 2+ and symmetric Lymph nodes: Cervical, supraclavicular, and axillary nodes normal. Neurologic: Grossly normal  Lab Results:  No results for input(s): WBC, HGB, HCT, PLT in the last 72 hours. BMET No results for input(s): NA, K,  CL, CO2, GLUCOSE, BUN, CREATININE, CALCIUM in the last 72 hours. PT/INR No results for input(s): LABPROT, INR in the last 72 hours. ABG No results for input(s): PHART, HCO3 in the last 72 hours.  Invalid input(s): PCO2, PO2  Studies/Results: No results found.  Anti-infectives: Anti-infectives    Start     Dose/Rate Route Frequency Ordered Stop   07/23/17 1800  cefTRIAXone (ROCEPHIN) 1 g in dextrose 5 % 50 mL IVPB     1 g 100 mL/hr over 30 Minutes Intravenous Every 24 hours 07/22/17 2110     07/22/17 1815  cefTRIAXone (ROCEPHIN) 1 g in dextrose 5 % 50 mL IVPB     1 g 100 mL/hr over 30 Minutes Intravenous  Once 07/22/17 1812 07/22/17 1847      Assessment/Plan:  1 - Left Ureteral Stone - now temporized with stent, rec ureteroscopy in elective setting in several weeks after clears infectious parameters.   2- Febrile Urinary Tract Infection - continue Rocephin as UCX confrims sensitive. Will likely DC on PO Cipro when afebrile x 24 hrs.    [facility], Kael Keetch 07/25/2017

## 2017-07-26 MED ORDER — CIPROFLOXACIN HCL 500 MG PO TABS
500.0000 mg | ORAL_TABLET | Freq: Two times a day (BID) | ORAL | Status: DC
  Administered 2017-07-26 – 2017-07-27 (×3): 500 mg via ORAL
  Filled 2017-07-26 (×3): qty 1

## 2017-07-26 NOTE — Progress Notes (Signed)
4 Days Post-Op   Subjective/Chief Complaint:  1 - Left Ureteral Stone - s/p left JJ stent (6x24 contour) for 45mm left proximal solitary stone 07/22/2017 with mod-severe hydro by CT 06/2017 on eval abdominal pain. Stone is solitary, 62mm, SSD 14cm and 1400HU. No prior stones.  2- Febrile Urinary Tract Infection - low grade fever to 100, leukocytosis to 17k, bacteruria, tachycardia c/w early pyelonephritis. UCX 8/28 e. Coli  sens cipro, rocephin. and [facility] 8/28 NGTD/pending. Placed on empiric Rocephin. Cr 1.17.  Today "Kimberly [name]" is stable.  Low grade fever early last night.  Feels better today.  Pain controlled.  Tolerating food.  Voiding without difficulty.  Objective: Vital signs in last 24 hours: Temp:  [98.1 F (36.7 C)-100.2 F (37.9 C)] 98.6 F (37 C) (09/01 0900) Pulse Rate:  [65-81] 78 (09/01 0900) Resp:  [16-19] 18 (09/01 0900) BP: (146-152)/(62-81) 148/64 (09/01 0900) SpO2:  [99 %-100 %] 100 % (09/01 0900) Weight:  [72.3 kg (159 lb 8 oz)] 72.3 kg (159 lb 8 oz) (08/31 2218) Last BM Date: 06/25/17  Intake/Output from previous day: 08/31 0701 - 09/01 0700 In: 1390 [P.O.:720; I.V.:620; IV Piggyback:50] Out: 4100 [Urine:4100] Intake/Output this shift: Total I/O In: 240 [P.O.:240] Out: 300 [Urine:300]  General appearance: alert, cooperative and blut affect as per baseline. In somewhat better spirits today.  Non-labored breathing   Lab Results:  No results for input(s): WBC, HGB, HCT, PLT in the last 72 hours. BMET No results for input(s): NA, K, CL, CO2, GLUCOSE, BUN, CREATININE, CALCIUM in the last 72 hours. PT/INR No results for input(s): LABPROT, INR in the last 72 hours. ABG No results for input(s): PHART, HCO3 in the last 72 hours.  Invalid input(s): PCO2, PO2  Studies/Results: No results found.  Anti-infectives: Anti-infectives    Start     Dose/Rate Route Frequency Ordered Stop   07/26/17 1130  ciprofloxacin (CIPRO) tablet 500 mg     500 mg Oral 2 times daily  07/26/17 1044     07/23/17 1800  cefTRIAXone (ROCEPHIN) 1 g in dextrose 5 % 50 mL IVPB     1 g 100 mL/hr over 30 Minutes Intravenous Every 24 hours 07/22/17 2110     07/22/17 1815  cefTRIAXone (ROCEPHIN) 1 g in dextrose 5 % 50 mL IVPB     1 g 100 mL/hr over 30 Minutes Intravenous  Once 07/22/17 1812 07/22/17 1847      Assessment/Plan:  1 - Left Ureteral Stone - now temporized with stent, rec ureteroscopy in elective setting in several weeks after clears infectious parameters.   2- Febrile Urinary Tract Infection - transition to Cipro, likely home tomorrow if no fevers.  Ardis Hughs 07/26/2017

## 2017-07-27 MED ORDER — TAMSULOSIN HCL 0.4 MG PO CAPS: 0.4000 mg | ORAL_CAPSULE | Freq: Every day | ORAL | 0 refills | Status: DC

## 2017-07-27 MED ORDER — CIPROFLOXACIN HCL 500 MG PO TABS: 500.0000 mg | ORAL_TABLET | Freq: Two times a day (BID) | ORAL | 0 refills | Status: DC

## 2017-07-27 MED ORDER — OXYCODONE HCL 5 MG PO TABS: 5.0000 mg | ORAL_TABLET | ORAL | 0 refills | Status: DC | PRN

## 2017-07-27 NOTE — Progress Notes (Signed)
Karin Golden to be D/C'd Home per MD order.  Discussed prescriptions and follow up appointments with the patient. Prescriptions given to patient, medication list explained in detail. Pt verbalized understanding.  Allergies as of 07/27/2017   No Known Allergies     Medication List    STOP taking these medications   amoxicillin-clavulanate 875-125 MG tablet Commonly known as:  AUGMENTIN   HYDROcodone-acetaminophen 5-325 MG tablet Commonly known as:  NORCO/VICODIN     TAKE these medications   BC FAST PAIN RELIEF PO Take 1 packet by mouth daily as needed (for paiin).   ciprofloxacin 500 MG tablet Commonly known as:  CIPRO Take 1 tablet (500 mg total) by mouth 2 (two) times daily.   feeding supplement (ENSURE ENLIVE) Liqd Take 237 mLs by mouth 2 (two) times daily between meals.   FLUoxetine 20 MG tablet Commonly known as:  PROZAC Take 1 tablet (20 mg total) by mouth daily.   folic acid 1 MG tablet Commonly known as:  FOLVITE Take 1 tablet (1 mg total) by mouth daily.   methocarbamol 500 MG tablet Commonly known as:  ROBAXIN Take 1 tablet (500 mg total) by mouth every 6 (six) hours as needed for muscle spasms.   omeprazole 20 MG capsule Commonly known as:  PRILOSEC Take 1 capsule (20 mg total) by mouth daily.   ondansetron 4 MG disintegrating tablet Commonly known as:  ZOFRAN ODT Take 1 tablet (4 mg total) by mouth every 8 (eight) hours as needed for nausea or vomiting.   ondansetron 4 MG tablet Commonly known as:  ZOFRAN Take 1 tablet (4 mg total) by mouth every 6 (six) hours.   oxyCODONE 5 MG immediate release tablet Commonly known as:  Oxy IR/ROXICODONE Take 1 tablet (5 mg total) by mouth every 4 (four) hours as needed for moderate pain.   ranitidine 150 MG capsule Commonly known as:  ZANTAC Take 1 capsule (150 mg total) by mouth daily.   risperiDONE 2 MG tablet Commonly known as:  RISPERDAL Take 1 tablet (2 mg total) by mouth daily.   tamsulosin 0.4 MG  Caps capsule Commonly known as:  FLOMAX Take 1 capsule (0.4 mg total) by mouth daily after supper.   thiamine 100 MG tablet Take 1 tablet (100 mg total) by mouth daily.   traZODone 100 MG tablet Commonly known as:  DESYREL Take 100 mg by mouth at bedtime.            Discharge Care Instructions        Start     Ordered   07/27/17 0000  oxyCODONE (OXY IR/ROXICODONE) 5 MG immediate release tablet  Every 4 hours PRN     07/27/17 0908   07/27/17 0000  ciprofloxacin (CIPRO) 500 MG tablet  2 times daily     07/27/17 0908   07/27/17 0000  tamsulosin (FLOMAX) 0.4 MG CAPS capsule  Daily after supper     07/27/17 0908   07/27/17 0000  Discharge patient    Question Answer Comment  Discharge disposition 01-Home or Self Care   Discharge patient date 07/27/2017      07/27/17 0908      Vitals:   07/27/17 0555 07/27/17 0842  BP: (!) 147/76 136/66  Pulse: 76 68  Resp: 18 18  Temp: 98.6 F (37 C) 98.6 F (37 C)  SpO2: 98% 98%    Skin clean, dry and intact without evidence of skin break down, no evidence of skin tears noted. IV catheter discontinued intact.  Site without signs and symptoms of complications. Dressing and pressure applied. Pt denies pain at this time. No complaints noted.  An After Visit Summary was printed and given to the patient. Patient escorted via [place], and D/C home via private auto.  Emilio Math, RN [facility] Phone [phone]

## 2017-07-27 NOTE — Discharge Instructions (Signed)

## 2017-07-28 LAB — CULTURE, BLOOD (ROUTINE X 2)
Culture: NO GROWTH
Culture: NO GROWTH
Special Requests: ADEQUATE
Special Requests: ADEQUATE

## 2017-08-02 NOTE — Discharge Summary (Signed)
Physician Discharge Summary  Patient ID: Kimberly [name] MRN: [id number] DOB/AGE: 27-Aug-1961 56 y.o.  Admit date: 07/22/2017 Discharge date: 07/27/2017  Admission Diagnoses: Ureteral stone with sepsis Discharge Diagnoses:  Active Problems:   Ureteral stone with hydronephrosis   Discharged Condition: good  Hospital Course: The patient tolerated the procedure well and was transferred to the floor on IV pain meds, IV fluid. On POD#1 foley was removed, pt was started on clear liquid diet and they ambulated in the halls. On POD#2 the patient was transitioned to a regular diet, IVFs were discontinued, and the patient passed flatus. Prior to discharge the pt was tolerating a regular diet, pain was controlled on PO pain meds, they were ambulating without difficulty, and they had normal bowel function.   Consults: None  Significant Diagnostic Studies: none  Treatments: surgery: ureteral stent placement  Discharge Exam: Blood pressure 136/66, pulse 68, temperature 98.6 F (37 C), temperature source Oral, resp. rate 18, height 5\' 7"  (1.702 m), weight 71.4 kg (157 lb 4.8 oz), last menstrual period 10/09/2012, SpO2 98 %. General appearance: alert, cooperative and appears stated age Eyes: conjunctivae/corneas clear. PERRL, EOM's intact. Fundi benign. Nose: Nares normal. Septum midline. Mucosa normal. No drainage or sinus tenderness. Resp: clear to auscultation bilaterally Cardio: regular rate and rhythm, S1, S2 normal, no murmur, click, rub or gallop GI: soft, non-tender; bowel sounds normal; no masses,  no organomegaly Extremities: extremities normal, atraumatic, no cyanosis or edema Neurologic: Grossly normal  Disposition: 01-Home or Self Care  Discharge Instructions    Discharge patient    Complete by:  As directed    Discharge disposition:  01-Home or Self Care   Discharge patient date:  07/27/2017     Allergies as of 07/27/2017   No Known Allergies     Medication List    STOP  taking these medications   amoxicillin-clavulanate 875-125 MG tablet Commonly known as:  AUGMENTIN   HYDROcodone-acetaminophen 5-325 MG tablet Commonly known as:  NORCO/VICODIN     TAKE these medications   BC FAST PAIN RELIEF PO Take 1 packet by mouth daily as needed (for paiin).   ciprofloxacin 500 MG tablet Commonly known as:  CIPRO Take 1 tablet (500 mg total) by mouth 2 (two) times daily.   feeding supplement (ENSURE ENLIVE) Liqd Take 237 mLs by mouth 2 (two) times daily between meals.   FLUoxetine 20 MG tablet Commonly known as:  PROZAC Take 1 tablet (20 mg total) by mouth daily.   folic acid 1 MG tablet Commonly known as:  FOLVITE Take 1 tablet (1 mg total) by mouth daily.   methocarbamol 500 MG tablet Commonly known as:  ROBAXIN Take 1 tablet (500 mg total) by mouth every 6 (six) hours as needed for muscle spasms.   omeprazole 20 MG capsule Commonly known as:  PRILOSEC Take 1 capsule (20 mg total) by mouth daily.   ondansetron 4 MG disintegrating tablet Commonly known as:  ZOFRAN ODT Take 1 tablet (4 mg total) by mouth every 8 (eight) hours as needed for nausea or vomiting.   ondansetron 4 MG tablet Commonly known as:  ZOFRAN Take 1 tablet (4 mg total) by mouth every 6 (six) hours.   oxyCODONE 5 MG immediate release tablet Commonly known as:  Oxy IR/ROXICODONE Take 1 tablet (5 mg total) by mouth every 4 (four) hours as needed for moderate pain.   ranitidine 150 MG capsule Commonly known as:  ZANTAC Take 1 capsule (150 mg total) by mouth daily.  risperiDONE 2 MG tablet Commonly known as:  RISPERDAL Take 1 tablet (2 mg total) by mouth daily.   tamsulosin 0.4 MG Caps capsule Commonly known as:  FLOMAX Take 1 capsule (0.4 mg total) by mouth daily after supper.   thiamine 100 MG tablet Take 1 tablet (100 mg total) by mouth daily.   traZODone 100 MG tablet Commonly known as:  DESYREL Take 100 mg by mouth at bedtime.            Discharge Care  Instructions        Start     Ordered   07/27/17 0000  oxyCODONE (OXY IR/ROXICODONE) 5 MG immediate release tablet  Every 4 hours PRN     07/27/17 0908   07/27/17 0000  ciprofloxacin (CIPRO) 500 MG tablet  2 times daily     07/27/17 0908   07/27/17 0000  tamsulosin (FLOMAX) 0.4 MG CAPS capsule  Daily after supper     07/27/17 0908   07/27/17 0000  Discharge patient    Question Answer Comment  Discharge disposition 01-Home or Self Care   Discharge patient date 07/27/2017      07/27/17 0626     Follow-up Information    Alexis Frock, MD. Call in 2 week(s).   Specialty:  Urology Contact information: [place] [address] [phone]           Signed: Nicolette Bang 08/02/2017, 4:20 PM

## 2017-08-08 ENCOUNTER — Encounter (HOSPITAL_BASED_OUTPATIENT_CLINIC_OR_DEPARTMENT_OTHER): Payer: Self-pay

## 2017-08-08 ENCOUNTER — Ambulatory Visit (HOSPITAL_BASED_OUTPATIENT_CLINIC_OR_DEPARTMENT_OTHER): Admit: 2017-08-08 | Payer: Self-pay | Admitting: Urology

## 2017-08-08 SURGERY — CYSTOSCOPY, WITH RETROGRADE PYELOGRAM AND URETERAL STENT INSERTION
Anesthesia: General | Laterality: Left

## 2018-02-16 ENCOUNTER — Encounter (HOSPITAL_COMMUNITY): Payer: Self-pay

## 2018-02-16 ENCOUNTER — Inpatient Hospital Stay (HOSPITAL_COMMUNITY)
Admission: AD | Admit: 2018-02-16 | Discharge: 2018-02-16 | Disposition: A | Discharge status: Home / Self Care | Payer: Self-pay | Source: Ambulatory Visit | Attending: Obstetrics & Gynecology | Admitting: Obstetrics & Gynecology

## 2018-02-16 DIAGNOSIS — E079 Disorder of thyroid, unspecified: Secondary | ICD-10-CM | POA: Insufficient documentation

## 2018-02-16 DIAGNOSIS — N3941 Urge incontinence: Secondary | ICD-10-CM | POA: Insufficient documentation

## 2018-02-16 DIAGNOSIS — Z7982 Long term (current) use of aspirin: Secondary | ICD-10-CM | POA: Insufficient documentation

## 2018-02-16 DIAGNOSIS — F319 Bipolar disorder, unspecified: Secondary | ICD-10-CM | POA: Insufficient documentation

## 2018-02-16 DIAGNOSIS — F17210 Nicotine dependence, cigarettes, uncomplicated: Secondary | ICD-10-CM | POA: Insufficient documentation

## 2018-02-16 DIAGNOSIS — R319 Hematuria, unspecified: Secondary | ICD-10-CM | POA: Insufficient documentation

## 2018-02-16 DIAGNOSIS — F419 Anxiety disorder, unspecified: Secondary | ICD-10-CM | POA: Insufficient documentation

## 2018-02-16 DIAGNOSIS — Z79899 Other long term (current) drug therapy: Secondary | ICD-10-CM | POA: Insufficient documentation

## 2018-02-16 DIAGNOSIS — B182 Chronic viral hepatitis C: Secondary | ICD-10-CM | POA: Insufficient documentation

## 2018-02-16 HISTORY — DX: Unspecified viral hepatitis C without hepatic coma: B19.20

## 2018-02-16 LAB — URINALYSIS, ROUTINE W REFLEX MICROSCOPIC
Bilirubin Urine: NEGATIVE
Glucose, UA: NEGATIVE mg/dL
Ketones, ur: NEGATIVE mg/dL
Nitrite: NEGATIVE
Protein, ur: 100 mg/dL — AB
Specific Gravity, Urine: 1.021 (ref 1.005–1.030)
pH: 6 (ref 5.0–8.0)

## 2018-02-16 MED ORDER — TAMSULOSIN HCL 0.4 MG PO CAPS: 0.4000 mg | ORAL_CAPSULE | Freq: Every day | ORAL | 0 refills | Status: DC

## 2018-02-16 NOTE — MAU Note (Signed)
Pt stated she has tested positive for Hep C in the past. Has not been treated for it. She wanted to know if there was something should could take to be treated.

## 2018-02-16 NOTE — Discharge Instructions (Signed)
Referrals made please expect a phone call to schedule appointments.   In late 2019, the [facility] will be moving to the [place]. At that time, the MAU (Maternity Admissions Unit), where you are being seen today, will no longer take care of non-pregnant patients. We strongly encourage you to find a doctor's office before that time, so that you can be seen with any GYN concerns, like vaginal discharge, urinary tract infection, etc.. in a timely manner.  In order to make an office visit more convenient, the Center for [place] at [facility] will be offering evening hours with same-day appointments, walk-in appointments and scheduled appointments available during this time.  Center for [facility] @ [facility] Hours: Monday - 8am - 7:30 pm with walk-in between 4pm- 7:30 pm Tuesday - 8 am - 5 pm (starting 02/24/18 we will be open late and accepting walk-ins from 4pm - 7:30pm) Wednesday - 8 am - 5 pm (starting 05/27/18 we will be open late and accepting walk-ins from 4pm - 7:30pm) Thursday 8 am - 5 pm (starting 08/27/18 we will be open late and accepting walk-ins from 4pm - 7:30pm) Friday 8 am - 5 pm  For an appointment please call the Center for [place] @ [facility] at [phone]  For urgent needs, [facility] is also available for management of urgent GYN complaints such as vaginal discharge or urinary tract infections.    Urinary Incontinence Urinary incontinence is the involuntary loss of urine from your bladder. What are the causes? There are many causes of urinary incontinence. They include:  Medicines.  Infections.  Prostatic enlargement, leading to overflow of urine from your bladder.  Surgery.  Neurological diseases.  Emotional factors.  What are the signs or symptoms? Urinary Incontinence can be divided into four types: 1. Urge incontinence. Urge incontinence is the involuntary loss of urine before you  have the opportunity to go to the bathroom. There is a sudden urge to void but not enough time to reach a bathroom. 2. Stress incontinence. Stress incontinence is the sudden loss of urine with any activity that forces urine to pass. It is commonly caused by anatomical changes to the pelvis and sphincter areas of your body. 3. Overflow incontinence. Overflow incontinence is the loss of urine from an obstructed opening to your bladder. This results in a backup of urine and a resultant buildup of pressure within the bladder. When the pressure within the bladder exceeds the closing pressure of the sphincter, the urine overflows, which causes incontinence, similar to water overflowing a dam. 4. Total incontinence. Total incontinence is the loss of urine as a result of the inability to store urine within your bladder.  How is this diagnosed? Evaluating the cause of incontinence may require:  A thorough and complete medical and obstetric history.  A complete physical exam.  Laboratory tests such as a urine culture and sensitivities.  When additional tests are indicated, they can include:  An ultrasound exam.  Kidney and bladder X-rays.  Cystoscopy. This is an exam of the bladder using a narrow scope.  Urodynamic testing to test the nerve function to the bladder and sphincter areas.  How is this treated? Treatment for urinary incontinence depends on the cause:  For urge incontinence caused by a bacterial infection, antibiotics will be prescribed. If the urge incontinence is related to medicines you take, your health care provider may have you change the medicine.  For stress incontinence, surgery to re-establish anatomical  support to the bladder or sphincter, or both, will often correct the condition.  For overflow incontinence caused by an enlarged prostate, an operation to open the channel through the enlarged prostate will allow the flow of urine out of the bladder. In women with fibroids,  a hysterectomy may be recommended.  For total incontinence, surgery on your urinary sphincter may help. An artificial urinary sphincter (an inflatable cuff placed around the urethra) may be required. In women who have developed a hole-like passage between their bladder and vagina (vesicovaginal fistula), surgery to close the fistula often is required.  Follow these instructions at home:  Normal daily hygiene and the use of pads or adult diapers that are changed regularly will help prevent odors and skin damage.  Avoid caffeine. It can overstimulate your bladder.  Use the bathroom regularly. Try about every 2-3 hours to go to the bathroom, even if you do not feel the need to do so. Take time to empty your bladder completely. After urinating, wait a minute. Then try to urinate again.  For causes involving nerve dysfunction, keep a log of the medicines you take and a journal of the times you go to the bathroom. Contact a health care provider if:  You experience worsening of pain instead of improvement in pain after your procedure.  Your incontinence becomes worse instead of better. Get help right away if:  You experience fever or shaking chills.  You are unable to pass your urine.  You have redness spreading into your groin or down into your thighs. This information is not intended to replace advice given to you by your health care provider. Make sure you discuss any questions you have with your health care provider. Document Released: 12/19/2004 Document Revised: 06/21/2016 Document Reviewed: 04/20/2013 Elsevier Interactive Patient Education  2018 [employer].   Kegel Exercises Kegel exercises help strengthen the muscles that support the rectum, vagina, small intestine, bladder, and uterus. Doing Kegel exercises can help:  Improve bladder and bowel control.  Improve sexual response.  Reduce problems and discomfort during pregnancy.  Kegel exercises involve squeezing your pelvic  floor muscles, which are the same muscles you squeeze when you try to stop the flow of urine. The exercises can be done while sitting, standing, or lying down, but it is best to vary your position. Phase 1 exercises 5. Squeeze your pelvic floor muscles tight. You should feel a tight lift in your rectal area. If you are a female, you should also feel a tightness in your vaginal area. Keep your stomach, buttocks, and legs relaxed. 6. Hold the muscles tight for up to 10 seconds. 7. Relax your muscles. Repeat this exercise 50 times a day or as many times as told by your health care provider. Continue to do this exercise for at least 4-6 weeks or for as long as told by your health care provider. This information is not intended to replace advice given to you by your health care provider. Make sure you discuss any questions you have with your health care provider. Document Released: 10/28/2012 Document Revised: 07/06/2016 Document Reviewed: 10/01/2015 Elsevier Interactive Patient Education  2018 [employer].     Exhaling, squeeze pelvic floor muscles as if to hold something in the vaginal opening. Inhaling, release. Can be done sitting, standing, side-lying, or knee-chest. Practice in a position that is comfortable and switch positions for variety. Repeat ___ times. Do ___ times per day.  Copyright  VHI. All rights reserved.

## 2018-02-16 NOTE — MAU Note (Signed)
Pt reports she has urinary frequency and pain/burning with dark urine/possible blood.Stated she is having urinary incontinent as well.SXtated she has had this problem since OCt 2018 after her kidney stone surgery.

## 2018-02-16 NOTE — MAU Provider Note (Signed)
History     CSN: [id number]  Arrival date and time: 02/16/18 1433   First Provider Initiated Contact with Patient 02/16/18 1624      Chief Complaint  Patient presents with  . Urinary Frequency   HPI Kimberly [name] is a 57 y.o. G1P1 whom presents to the MAU with complaints of urinary incontinence. Patient states that she is unable to "hold her urine." This problem is not new and has been present since September of last year. Patient feels like it developed after she was treated for her kidney stones last year. She endorses dark urine that sometimes has blood. Denies any fevers, odor, vaginal discharge. She endorses dysuria and frequency.   Recent diagnosis of hep c. Requesting treatment. Has had for >10years without treatment. Denies any substance use.    Past Medical History:  Diagnosis Date  . Anxiety   . Bipolar 1 disorder ([place])   . Depression   . Hepatitis C   . Thyroid disease     Past Surgical History:  Procedure Laterality Date  . ACROMIO-CLAVICULAR JOINT REPAIR Left 02/24/2015   Procedure: LEFT ACROMIO-CLAVICULAR JOINT RECONSTRUCTION;  Surgeon: Leandrew Koyanagi, MD;  Location: [facility];  Service: Orthopedics;  Laterality: Left;  . CYSTOSCOPY W/ URETERAL STENT PLACEMENT Left 07/22/2017   Procedure: CYSTOSCOPY WITH RETROGRADE PYELOGRAM/URETERAL STENT PLACEMENT;  Surgeon: Alexis Frock, MD;  Location: [facility];  Service: Urology;  Laterality: Left;  . ORIF MANDIBULAR FRACTURE N/A 03/22/2016   Procedure: OPEN REDUCTION INTERNAL FIXATION (ORIF) MANDIBULAR FRACTURE;  Surgeon: Melissa Montane, MD;  Location: [facility];  Service: ENT;  Laterality: N/A;    No family history on file.  Social History   Tobacco Use  . Smoking status: Current Every Day Smoker    Types: Cigarettes  . Smokeless tobacco: Never Used  Substance Use Topics  . Alcohol use: Yes  . Drug use: Yes    Comment: Pt denies substance use during this TTS assessment on 08-25-14 UDS + for THC    Allergies: No Known  Allergies  Medications Prior to Admission  Medication Sig Dispense Refill Last Dose  . Aspirin-Caffeine (BC FAST PAIN RELIEF PO) Take 1 packet by mouth daily as needed (for paiin).   Not Taking at Unknown time  . ciprofloxacin (CIPRO) 500 MG tablet Take 1 tablet (500 mg total) by mouth 2 (two) times daily. 28 tablet 0   . feeding supplement, ENSURE ENLIVE, (ENSURE ENLIVE) LIQD Take 237 mLs by mouth 2 (two) times daily between meals. (Patient not taking: Reported on 07/22/2017) 237 mL 12 Not Taking at Unknown time  . FLUoxetine (PROZAC) 20 MG tablet Take 1 tablet (20 mg total) by mouth daily. (Patient not taking: Reported on 07/22/2017) 30 tablet 0 Not Taking at Unknown time  . folic acid (FOLVITE) 1 MG tablet Take 1 tablet (1 mg total) by mouth daily. (Patient not taking: Reported on 07/22/2017) 30 tablet 0 Not Taking at Unknown time  . methocarbamol (ROBAXIN) 500 MG tablet Take 1 tablet (500 mg total) by mouth every 6 (six) hours as needed for muscle spasms. (Patient not taking: Reported on 07/22/2017) 21 tablet 0 Not Taking at Unknown time  . omeprazole (PRILOSEC) 20 MG capsule Take 1 capsule (20 mg total) by mouth daily. (Patient not taking: Reported on 07/22/2017) 30 capsule 0 Not Taking at Unknown time  . ondansetron (ZOFRAN ODT) 4 MG disintegrating tablet Take 1 tablet (4 mg total) by mouth every 8 (eight) hours as needed for nausea or vomiting. (Patient  not taking: Reported on 07/22/2017) 10 tablet 0 Not Taking at Unknown time  . ondansetron (ZOFRAN) 4 MG tablet Take 1 tablet (4 mg total) by mouth every 6 (six) hours. (Patient not taking: Reported on 07/22/2017) 12 tablet 0 Not Taking at Unknown time  . oxyCODONE (OXY IR/ROXICODONE) 5 MG immediate release tablet Take 1 tablet (5 mg total) by mouth every 4 (four) hours as needed for moderate pain. 30 tablet 0   . ranitidine (ZANTAC) 150 MG capsule Take 1 capsule (150 mg total) by mouth daily. (Patient not taking: Reported on 07/22/2017) 30 capsule 0 Not  Taking at Unknown time  . risperiDONE (RISPERDAL) 2 MG tablet Take 1 tablet (2 mg total) by mouth daily. (Patient not taking: Reported on 07/22/2017) 30 tablet 0 Not Taking at Unknown time  . tamsulosin (FLOMAX) 0.4 MG CAPS capsule Take 1 capsule (0.4 mg total) by mouth daily after supper. 30 capsule 0   . thiamine 100 MG tablet Take 1 tablet (100 mg total) by mouth daily. (Patient not taking: Reported on 08/22/2016) 30 tablet 0 Not Taking at Unknown time  . traZODone (DESYREL) 100 MG tablet Take 100 mg by mouth at bedtime.   Not Taking at Unknown time    Review of Systems  Constitutional: Negative.   Respiratory: Negative for shortness of breath.   Cardiovascular: Negative for chest pain.  Gastrointestinal: Negative for abdominal pain.  Genitourinary: Positive for dysuria, frequency, hematuria and urgency. Negative for vaginal bleeding and vaginal discharge.  Musculoskeletal: Negative for back pain.    All systems reviewed and are negative for acute change except as noted in the HPI.  Physical Exam   Blood pressure (!) 147/89, pulse 87, temperature 98.4 F (36.9 C), temperature source Oral, resp. rate 18, height 5\' 7"  (1.702 m), weight 73.5 kg (162 lb), last menstrual period 10/09/2012.  Physical Exam  Nursing note and vitals reviewed. Constitutional: She is oriented to person, place, and time. She appears well-developed and well-nourished. No distress.  HENT:  Head: Normocephalic and atraumatic.  Eyes: Conjunctivae and EOM are normal.  Neck: Normal range of motion.  Cardiovascular: Normal rate, regular rhythm and intact distal pulses.  Respiratory: Effort normal.  GI: Soft. Bowel sounds are normal. There is no tenderness. There is no guarding and no CVA tenderness.  Multiple well-healed incisions on abdomen.  Musculoskeletal: Normal range of motion. She exhibits no edema or tenderness.  Neurological: She is alert and oriented to person, place, and time.  Skin: Skin is warm and dry.   Psychiatric: She has a normal mood and affect.   Results for orders placed or performed during the hospital encounter of 02/16/18  Urinalysis, Routine w reflex microscopic  Result Value Ref Range   Color, Urine YELLOW YELLOW   APPearance CLOUDY (A) CLEAR   Specific Gravity, Urine 1.021 1.005 - 1.030   pH 6.0 5.0 - 8.0   Glucose, UA NEGATIVE NEGATIVE mg/dL   Hgb urine dipstick LARGE (A) NEGATIVE   Bilirubin Urine NEGATIVE NEGATIVE   Ketones, ur NEGATIVE NEGATIVE mg/dL   Protein, ur 100 (A) NEGATIVE mg/dL   Nitrite NEGATIVE NEGATIVE   Leukocytes, UA SMALL (A) NEGATIVE   RBC / HPF TOO NUMEROUS TO COUNT 0 - 5 RBC/hpf   WBC, UA TOO NUMEROUS TO COUNT 0 - 5 WBC/hpf   Bacteria, UA RARE (A) NONE SEEN   Squamous Epithelial / LPF 0-5 (A) NONE SEEN   Mucus PRESENT    Ca Oxalate Crys, UA PRESENT  MAU Course  Procedures  MDM -UA without signs of UTI but hematuria present. Concern for recurring renal stone due to blood and CaOx crystals. No CVA tenderness or back pain.  -h/o renal stones and s/p surgery -Discussed nature of MAU with patient and that we would be unable to fix  Her incontinence or Hep C today.   Assessment and Plan   1. Urge incontinence of urine   2. Hematuria, unspecified type   3. Chronic hepatitis C without hepatic coma (HCC)    Discharge home in stable condition Rx for Flomax to help with passage of possible renal calculi Referral made to patient's urologist for evaluation of incontinence and hematuria Referral to GI for hep C treatment Patient given return precautions if her symptoms were to worsen  Luiz Blare, DO 02/16/2018, 4:24 PM

## 2018-03-31 ENCOUNTER — Other Ambulatory Visit: Payer: Self-pay | Admitting: Obstetrics and Gynecology

## 2018-09-07 ENCOUNTER — Ambulatory Visit (HOSPITAL_COMMUNITY)
Admission: EM | Admit: 2018-09-07 | Discharge: 2018-09-07 | Disposition: A | Discharge status: Home / Self Care | Payer: Self-pay | Attending: Family Medicine | Admitting: Family Medicine

## 2018-09-07 ENCOUNTER — Encounter (HOSPITAL_COMMUNITY): Payer: Self-pay

## 2018-09-07 DIAGNOSIS — R300 Dysuria: Secondary | ICD-10-CM | POA: Insufficient documentation

## 2018-09-07 DIAGNOSIS — N12 Tubulo-interstitial nephritis, not specified as acute or chronic: Secondary | ICD-10-CM | POA: Insufficient documentation

## 2018-09-07 DIAGNOSIS — F17210 Nicotine dependence, cigarettes, uncomplicated: Secondary | ICD-10-CM | POA: Insufficient documentation

## 2018-09-07 DIAGNOSIS — N10 Acute tubulo-interstitial nephritis: Secondary | ICD-10-CM

## 2018-09-07 DIAGNOSIS — Z79899 Other long term (current) drug therapy: Secondary | ICD-10-CM | POA: Insufficient documentation

## 2018-09-07 DIAGNOSIS — Z7982 Long term (current) use of aspirin: Secondary | ICD-10-CM | POA: Insufficient documentation

## 2018-09-07 DIAGNOSIS — Z79891 Long term (current) use of opiate analgesic: Secondary | ICD-10-CM | POA: Insufficient documentation

## 2018-09-07 LAB — POCT URINALYSIS DIP (DEVICE)
Bilirubin Urine: NEGATIVE
Glucose, UA: NEGATIVE mg/dL
Ketones, ur: 15 mg/dL — AB
Nitrite: POSITIVE — AB
Protein, ur: >=300 mg/dL — AB
Specific Gravity, Urine: >=1.03 (ref 1.005–1.030)
Urobilinogen, UA: 0.2 mg/dL (ref 0.0–1.0)
pH: 6 (ref 5.0–8.0)

## 2018-09-07 MED ORDER — CIPROFLOXACIN HCL 500 MG PO TABS: 500.0000 mg | ORAL_TABLET | Freq: Two times a day (BID) | ORAL | 0 refills | Status: AC

## 2018-09-07 MED ORDER — CEFTRIAXONE SODIUM 1 G IJ SOLR
1.0000 g | Freq: Once | INTRAMUSCULAR | Status: AC
  Administered 2018-09-07: 1 g via INTRAMUSCULAR

## 2018-09-07 MED ORDER — CEFTRIAXONE SODIUM 1 G IJ SOLR
INTRAMUSCULAR | Status: AC
  Filled 2018-09-07: qty 10

## 2018-09-07 NOTE — Discharge Instructions (Signed)
Please pick up cipro from [facility] and take twice daily for 5 days - Should be 8-9$ with coupon  Urine showed evidence of infection. We are treating you with cipro. Be sure to take full course. Stay hydrated- urine should be pale yellow to clear. My continue azo for relief of burning while infection is being cleared.   Please return or follow up with your primary provider if symptoms not improving with treatment. Please return sooner if you have worsening of symptoms or develop fever, nausea, vomiting, abdominal pain, back pain, lightheadedness, dizziness.

## 2018-09-07 NOTE — ED Triage Notes (Signed)
Pt presents with urinary tract symptoms; frequent urination and burning.

## 2018-09-09 NOTE — ED Provider Notes (Signed)
[facility]    CSN: [id number] Arrival date & time: 09/07/18  1746     History   Chief Complaint Chief Complaint  Patient presents with  . Urinary Tract Infection    HPI Kimberly [name] is a 57 y.o. female history of anxiety, bipolar type I, depression, alcohol abuse presenting today for evaluation of possible UTI.  Patient states that she has had increased frequency of urination and dysuria.  Symptoms have been going on for months.  Patient states that she has not had the money in order to come and be evaluated.  She has had some mild back pain.  Endorses subjective fevers.  Denies vaginal discharge or vaginal irritation/itching.  She is not taking anything for her symptoms.  HPI  Past Medical History:  Diagnosis Date  . Anxiety   . Bipolar 1 disorder ([place])   . Depression   . Hepatitis C   . Thyroid disease     Patient Active Problem List   Diagnosis Date Noted  . Ureteral stone with hydronephrosis 07/22/2017  . Mandibular fracture, closed, initial encounter 03/21/2016  . Alcohol abuse     Past Surgical History:  Procedure Laterality Date  . ACROMIO-CLAVICULAR JOINT REPAIR Left 02/24/2015   Procedure: LEFT ACROMIO-CLAVICULAR JOINT RECONSTRUCTION;  Surgeon: Leandrew Koyanagi, MD;  Location: [facility];  Service: Orthopedics;  Laterality: Left;  . CYSTOSCOPY W/ URETERAL STENT PLACEMENT Left 07/22/2017   Procedure: CYSTOSCOPY WITH RETROGRADE PYELOGRAM/URETERAL STENT PLACEMENT;  Surgeon: Alexis Frock, MD;  Location: [facility];  Service: Urology;  Laterality: Left;  . ORIF MANDIBULAR FRACTURE N/A 03/22/2016   Procedure: OPEN REDUCTION INTERNAL FIXATION (ORIF) MANDIBULAR FRACTURE;  Surgeon: Melissa Montane, MD;  Location: [facility];  Service: ENT;  Laterality: N/A;    OB History   None      Home Medications    Prior to Admission medications   Medication Sig Start Date End Date Taking? Authorizing Provider  Aspirin-Caffeine (BC FAST PAIN RELIEF PO) Take 1 packet by mouth  daily as needed (for paiin).    [provider]  ciprofloxacin (CIPRO) 500 MG tablet Take 1 tablet (500 mg total) by mouth every 12 (twelve) hours for 5 days. 09/07/18 09/12/18  Kiri Hinderliter C, PA-C  feeding supplement, ENSURE ENLIVE, (ENSURE ENLIVE) LIQD Take 237 mLs by mouth 2 (two) times daily between meals. Patient not taking: Reported on 07/22/2017 03/26/16   Lavina Hamman, MD  FLUoxetine (PROZAC) 20 MG tablet Take 1 tablet (20 mg total) by mouth daily. Patient not taking: Reported on 07/22/2017 08/25/14   Carmin Muskrat, MD  folic acid (FOLVITE) 1 MG tablet Take 1 tablet (1 mg total) by mouth daily. Patient not taking: Reported on 07/22/2017 03/26/16   Lavina Hamman, MD  methocarbamol (ROBAXIN) 500 MG tablet Take 1 tablet (500 mg total) by mouth every 6 (six) hours as needed for muscle spasms. Patient not taking: Reported on 07/22/2017 03/26/16   Lavina Hamman, MD  omeprazole (PRILOSEC) 20 MG capsule Take 1 capsule (20 mg total) by mouth daily. Patient not taking: Reported on 07/22/2017 08/22/16   Waynetta Pean, PA-C  ondansetron (ZOFRAN ODT) 4 MG disintegrating tablet Take 1 tablet (4 mg total) by mouth every 8 (eight) hours as needed for nausea or vomiting. Patient not taking: Reported on 07/22/2017 08/22/16   Waynetta Pean, PA-C  ondansetron (ZOFRAN) 4 MG tablet Take 1 tablet (4 mg total) by mouth every 6 (six) hours. Patient not taking: Reported on 07/22/2017 04/27/16  Mesner, Corene Cornea, MD  oxyCODONE (OXY IR/ROXICODONE) 5 MG immediate release tablet Take 1 tablet (5 mg total) by mouth every 4 (four) hours as needed for moderate pain. 07/27/17   McKenzie, Candee Furbish, MD  ranitidine (ZANTAC) 150 MG capsule Take 1 capsule (150 mg total) by mouth daily. Patient not taking: Reported on 07/22/2017 08/22/16   Waynetta Pean, PA-C  risperiDONE (RISPERDAL) 2 MG tablet Take 1 tablet (2 mg total) by mouth daily. Patient not taking: Reported on 07/22/2017 08/25/14   Carmin Muskrat, MD  tamsulosin  (FLOMAX) 0.4 MG CAPS capsule Take 1 capsule (0.4 mg total) by mouth daily after supper. 02/16/18   Katheren Shams, DO  thiamine 100 MG tablet Take 1 tablet (100 mg total) by mouth daily. Patient not taking: Reported on 08/22/2016 03/26/16   Lavina Hamman, MD  traZODone (DESYREL) 100 MG tablet Take 100 mg by mouth at bedtime.    [provider]    Family History History reviewed. No pertinent family history.  Social History Social History   Tobacco Use  . Smoking status: Current Every Day Smoker    Types: Cigarettes  . Smokeless tobacco: Never Used  Substance Use Topics  . Alcohol use: Yes  . Drug use: Yes    Comment: Pt denies substance use during this TTS assessment on 08-25-14 UDS + for [facility]     Allergies   Patient has no known allergies.   Review of Systems Review of Systems  Constitutional: Negative for fever.  Respiratory: Negative for shortness of breath.   Cardiovascular: Negative for chest pain.  Gastrointestinal: Negative for abdominal pain, diarrhea, nausea and vomiting.  Genitourinary: Positive for dysuria and frequency. Negative for flank pain, genital sores, hematuria, menstrual problem, vaginal bleeding, vaginal discharge and vaginal pain.  Musculoskeletal: Positive for back pain.  Skin: Negative for rash.  Neurological: Negative for dizziness, light-headedness and headaches.     Physical Exam Triage Vital Signs ED Triage Vitals  Enc Vitals Group     BP 09/07/18 1909 (!) 145/114     Pulse Rate 09/07/18 1909 (!) 108     Resp 09/07/18 1909 20     Temp 09/07/18 1909 98.7 F (37.1 C)     Temp Source 09/07/18 1909 Oral     SpO2 09/07/18 1909 96 %     Weight --      Height --      Head Circumference --      Peak Flow --      Pain Score 09/07/18 1910 6     Pain Loc --      Pain Edu? --      Excl. in [place]? --    No data found.  Updated Vital Signs BP (!) 145/114 (BP Location: Left Arm)   Pulse (!) 108   Temp 98.7 F (37.1 C) (Oral)   Resp  20   LMP 10/09/2012   SpO2 96%   Visual Acuity Right Eye Distance:   Left Eye Distance:   Bilateral Distance:    Right Eye Near:   Left Eye Near:    Bilateral Near:     Physical Exam  Constitutional: She appears well-developed and well-nourished. No distress.  HENT:  Head: Normocephalic and atraumatic.  Eyes: Conjunctivae are normal.  Neck: Neck supple.  Cardiovascular: Normal rate and regular rhythm.  No murmur heard. Pulmonary/Chest: Effort normal and breath sounds normal. No respiratory distress.  Breathing comfortably at rest, CTABL, no wheezing, rales or other adventitious sounds auscultated  Abdominal: Soft. There is tenderness.  Mild tenderness throughout abdomen, well-healed surgical scars midline and in left upper quadrant  Weakly positive CVA tenderness  Musculoskeletal: She exhibits no edema.  Neurological: She is alert.  Skin: Skin is warm and dry.  Psychiatric: She has a normal mood and affect.  Nursing note and vitals reviewed.    UC Treatments / Results  Labs (all labs ordered are listed, but only abnormal results are displayed) Labs Reviewed  URINE CULTURE - Abnormal; Notable for the following components:      Result Value   Culture   (*)    Value: >=100,000 COLONIES/mL STAPHYLOCOCCUS AUREUS SUSCEPTIBILITIES TO FOLLOW Performed at [facility], [place] [address], [place], [place] [address]    All other components within normal limits  POCT URINALYSIS DIP (DEVICE) - Abnormal; Notable for the following components:   Ketones, ur 15 (*)    Hgb urine dipstick LARGE (*)    Protein, ur >=300 (*)    Nitrite POSITIVE (*)    Leukocytes, UA SMALL (*)    All other components within normal limits    EKG None  Radiology No results found.  Procedures Procedures (including critical care time)  Medications Ordered in UC Medications  cefTRIAXone (ROCEPHIN) injection 1 g (1 g Intramuscular Given 09/07/18 1951)    Initial Impression / Assessment  and Plan / UC Course  I have reviewed the triage vital signs and the nursing notes.  Pertinent labs & imaging results that were available during my care of the patient were reviewed by me and considered in my medical decision making (see chart for details).     Patient with positive nitrites, small leuks, tachycardic weakly positive CVA tenderness.  Symptoms going on for many months.  Will treat for pyelonephritis, provided Rocephin prior to discharge.  Patient expressed concern about feeling prescriptions, provided patient with good Rx card as she said that she had $10 and should be able to cover Cipro twice daily x5 days with this.  Discussed importance of obtaining this medicine.  Follow-up if developing worsening symptoms,Discussed strict return precautions. Patient verbalized understanding and is agreeable with plan.  Final Clinical Impressions(s) / UC Diagnoses   Final diagnoses:  Dysuria  Pyelonephritis     Discharge Instructions     Please pick up cipro from [facility] and take twice daily for 5 days - Should be 8-9$ with coupon  Urine showed evidence of infection. We are treating you with cipro. Be sure to take full course. Stay hydrated- urine should be pale yellow to clear. My continue azo for relief of burning while infection is being cleared.   Please return or follow up with your primary provider if symptoms not improving with treatment. Please return sooner if you have worsening of symptoms or develop fever, nausea, vomiting, abdominal pain, back pain, lightheadedness, dizziness.   ED Prescriptions    Medication Sig Dispense Auth. Provider   ciprofloxacin (CIPRO) 500 MG tablet Take 1 tablet (500 mg total) by mouth every 12 (twelve) hours for 5 days. 10 tablet Anas Reister, [place] C, PA-C     Controlled Substance Prescriptions [place] Controlled Substance Registry consulted? Not Applicable   Janith Lima, [place] 09/09/18 7412

## 2018-09-10 LAB — URINE CULTURE: Culture: >=100000 — AB

## 2018-09-11 ENCOUNTER — Telehealth (HOSPITAL_COMMUNITY): Payer: Self-pay

## 2018-09-11 NOTE — Telephone Encounter (Signed)
Urine culture positive for Staphylococcus Aures. This was treated with Cipro at ucc visit. Attempted to reach patient. No answer at this time.

## 2018-09-28 ENCOUNTER — Ambulatory Visit (HOSPITAL_COMMUNITY)
Admission: EM | Admit: 2018-09-28 | Discharge: 2018-09-28 | Disposition: A | Discharge status: Home / Self Care | Payer: Self-pay | Attending: Family Medicine | Admitting: Family Medicine

## 2018-09-28 ENCOUNTER — Other Ambulatory Visit: Payer: Self-pay

## 2018-09-28 ENCOUNTER — Encounter (HOSPITAL_COMMUNITY): Payer: Self-pay | Admitting: *Deleted

## 2018-09-28 DIAGNOSIS — Z9889 Other specified postprocedural states: Secondary | ICD-10-CM | POA: Insufficient documentation

## 2018-09-28 DIAGNOSIS — F17210 Nicotine dependence, cigarettes, uncomplicated: Secondary | ICD-10-CM | POA: Insufficient documentation

## 2018-09-28 DIAGNOSIS — N390 Urinary tract infection, site not specified: Secondary | ICD-10-CM | POA: Insufficient documentation

## 2018-09-28 DIAGNOSIS — F319 Bipolar disorder, unspecified: Secondary | ICD-10-CM | POA: Insufficient documentation

## 2018-09-28 DIAGNOSIS — Z792 Long term (current) use of antibiotics: Secondary | ICD-10-CM | POA: Insufficient documentation

## 2018-09-28 DIAGNOSIS — Z79899 Other long term (current) drug therapy: Secondary | ICD-10-CM | POA: Insufficient documentation

## 2018-09-28 DIAGNOSIS — F1011 Alcohol abuse, in remission: Secondary | ICD-10-CM | POA: Insufficient documentation

## 2018-09-28 MED ORDER — PHENAZOPYRIDINE HCL 200 MG PO TABS: 200.0000 mg | ORAL_TABLET | Freq: Three times a day (TID) | ORAL | 0 refills | Status: DC

## 2018-09-28 MED ORDER — CEFTRIAXONE SODIUM 1 G IJ SOLR
1.0000 g | Freq: Once | INTRAMUSCULAR | Status: AC
  Administered 2018-09-28: 1 g via INTRAMUSCULAR

## 2018-09-28 MED ORDER — SULFAMETHOXAZOLE-TRIMETHOPRIM 800-160 MG PO TABS: 1.0000 | ORAL_TABLET | Freq: Two times a day (BID) | ORAL | 0 refills | Status: AC

## 2018-09-28 MED ORDER — CEFTRIAXONE SODIUM 1 G IJ SOLR
INTRAMUSCULAR | Status: AC
  Filled 2018-09-28: qty 10

## 2018-09-28 MED ORDER — LIDOCAINE HCL (PF) 1 % IJ SOLN
INTRAMUSCULAR | Status: AC
  Filled 2018-09-28: qty 2

## 2018-09-28 MED ORDER — SULFAMETHOXAZOLE-TRIMETHOPRIM 800-160 MG PO TABS: 1.0000 | ORAL_TABLET | Freq: Two times a day (BID) | ORAL | 0 refills | Status: DC

## 2018-09-28 NOTE — ED Notes (Signed)
Urinalysis testing exceeded the limitations of the [employer] capabilities

## 2018-09-28 NOTE — ED Provider Notes (Signed)
[facility]    CSN: [id number] Arrival date & time: 09/28/18  1354     History   Chief Complaint Chief Complaint  Patient presents with  . Abdominal Pain    HPI Kimberly [name] is a 57 y.o. female.   Pt is a 57 year old female that presents with continued dysuria, suprapubic pressure, urinary frequency and retention. This has been ongoing for some time. She was seen here on the 14th where she had worse symptoms and was treated for possible pyelonephritis with rocephin injection and Cipro outpatient. Her symptoms have not decreased. The urine culture revealed staph aurous with was sensitive to the cipro. She took all of the medication. She is not having any back pain, flank pain or abdominal pain. She denies any fevers, chills, nausea, vomiting. She denies any vaginal symptoms. She does have a hx of kidney stones with ureteral stent placement.   ROS per HPI      Past Medical History:  Diagnosis Date  . Anxiety   . Bipolar 1 disorder ([place])   . Depression   . Hepatitis C     Patient Active Problem List   Diagnosis Date Noted  . Ureteral stone with hydronephrosis 07/22/2017  . Mandibular fracture, closed, initial encounter 03/21/2016  . Alcohol abuse     Past Surgical History:  Procedure Laterality Date  . ACROMIO-CLAVICULAR JOINT REPAIR Left 02/24/2015   Procedure: LEFT ACROMIO-CLAVICULAR JOINT RECONSTRUCTION;  Surgeon: Leandrew Koyanagi, MD;  Location: [facility];  Service: Orthopedics;  Laterality: Left;  . CYSTOSCOPY W/ URETERAL STENT PLACEMENT Left 07/22/2017   Procedure: CYSTOSCOPY WITH RETROGRADE PYELOGRAM/URETERAL STENT PLACEMENT;  Surgeon: Alexis Frock, MD;  Location: [facility];  Service: Urology;  Laterality: Left;  . ORIF MANDIBULAR FRACTURE N/A 03/22/2016   Procedure: OPEN REDUCTION INTERNAL FIXATION (ORIF) MANDIBULAR FRACTURE;  Surgeon: Melissa Montane, MD;  Location: [facility];  Service: ENT;  Laterality: N/A;    OB History   None      Home Medications     Prior to Admission medications   Medication Sig Start Date End Date Taking? Authorizing Provider  Aspirin-Caffeine (BC FAST PAIN RELIEF PO) Take 1 packet by mouth daily as needed (for paiin).    [provider]  feeding supplement, ENSURE ENLIVE, (ENSURE ENLIVE) LIQD Take 237 mLs by mouth 2 (two) times daily between meals. Patient not taking: Reported on 07/22/2017 03/26/16   Lavina Hamman, MD  FLUoxetine (PROZAC) 20 MG tablet Take 1 tablet (20 mg total) by mouth daily. Patient not taking: Reported on 07/22/2017 08/25/14   Carmin Muskrat, MD  folic acid (FOLVITE) 1 MG tablet Take 1 tablet (1 mg total) by mouth daily. Patient not taking: Reported on 07/22/2017 03/26/16   Lavina Hamman, MD  methocarbamol (ROBAXIN) 500 MG tablet Take 1 tablet (500 mg total) by mouth every 6 (six) hours as needed for muscle spasms. Patient not taking: Reported on 07/22/2017 03/26/16   Lavina Hamman, MD  omeprazole (PRILOSEC) 20 MG capsule Take 1 capsule (20 mg total) by mouth daily. Patient not taking: Reported on 07/22/2017 08/22/16   Waynetta Pean, PA-C  ondansetron (ZOFRAN ODT) 4 MG disintegrating tablet Take 1 tablet (4 mg total) by mouth every 8 (eight) hours as needed for nausea or vomiting. Patient not taking: Reported on 07/22/2017 08/22/16   Waynetta Pean, PA-C  ondansetron (ZOFRAN) 4 MG tablet Take 1 tablet (4 mg total) by mouth every 6 (six) hours. Patient not taking: Reported on 07/22/2017 04/27/16  Mesner, Corene Cornea, MD  oxyCODONE (OXY IR/ROXICODONE) 5 MG immediate release tablet Take 1 tablet (5 mg total) by mouth every 4 (four) hours as needed for moderate pain. 07/27/17   McKenzie, Candee Furbish, MD  phenazopyridine (PYRIDIUM) 200 MG tablet Take 1 tablet (200 mg total) by mouth 3 (three) times daily. 09/28/18   Loura Halt A, NP  ranitidine (ZANTAC) 150 MG capsule Take 1 capsule (150 mg total) by mouth daily. Patient not taking: Reported on 07/22/2017 08/22/16   Waynetta Pean, PA-C  risperiDONE  (RISPERDAL) 2 MG tablet Take 1 tablet (2 mg total) by mouth daily. Patient not taking: Reported on 07/22/2017 08/25/14   Carmin Muskrat, MD  sulfamethoxazole-trimethoprim (BACTRIM DS,SEPTRA DS) 800-160 MG tablet Take 1 tablet by mouth 2 (two) times daily for 7 days. 09/28/18 10/05/18  Loura Halt A, NP  tamsulosin (FLOMAX) 0.4 MG CAPS capsule Take 1 capsule (0.4 mg total) by mouth daily after supper. 02/16/18   Katheren Shams, DO  thiamine 100 MG tablet Take 1 tablet (100 mg total) by mouth daily. Patient not taking: Reported on 08/22/2016 03/26/16   Lavina Hamman, MD  traZODone (DESYREL) 100 MG tablet Take 100 mg by mouth at bedtime.    [provider]    Family History No family history on file.  Social History Social History   Tobacco Use  . Smoking status: Current Every Day Smoker    Types: Cigarettes  . Smokeless tobacco: Never Used  Substance Use Topics  . Alcohol use: Yes  . Drug use: Not Currently    Comment: Pt denies substance use during this TTS assessment on 08-25-14 UDS + for [facility]     Allergies   Patient has no known allergies.   Review of Systems Review of Systems   Physical Exam Triage Vital Signs ED Triage Vitals  Enc Vitals Group     BP 09/28/18 1508 (!) 169/70     Pulse Rate 09/28/18 1508 79     Resp 09/28/18 1508 18     Temp 09/28/18 1508 98.2 F (36.8 C)     Temp Source 09/28/18 1508 Oral     SpO2 09/28/18 1508 99 %     Weight --      Height --      Head Circumference --      Peak Flow --      Pain Score 09/28/18 1509 10     Pain Loc --      Pain Edu? --      Excl. in [place]? --    No data found.  Updated Vital Signs BP (!) 169/70 (BP Location: Right Arm)   Pulse 79   Temp 98.2 F (36.8 C) (Oral)   Resp 18   LMP 10/09/2012   SpO2 99%   Visual Acuity Right Eye Distance:   Left Eye Distance:   Bilateral Distance:    Right Eye Near:   Left Eye Near:    Bilateral Near:     Physical Exam  Constitutional: She appears  well-developed and well-nourished.  Very pleasant. Non toxic or ill appearing.   HENT:  Head: Normocephalic and atraumatic.  Pulmonary/Chest: Effort normal.  Abdominal: Soft. Normal appearance and bowel sounds are normal. There is tenderness in the suprapubic area. There is no CVA tenderness.   No CVA tenderness. No rebound tenderness.   Neurological: She is alert.  Skin: Skin is warm and dry.  Psychiatric: She has a normal mood and affect.  Nursing note and  vitals reviewed.    UC Treatments / Results  Labs (all labs ordered are listed, but only abnormal results are displayed) Labs Reviewed  URINE CULTURE    EKG None  Radiology No results found.  Procedures Procedures (including critical care time)  Medications Ordered in UC Medications  cefTRIAXone (ROCEPHIN) injection 1 g (1 g Intramuscular Given 09/28/18 1601)    Initial Impression / Assessment and Plan / UC Course  I have reviewed the triage vital signs and the nursing notes.  Pertinent labs & imaging results that were available during my care of the patient were reviewed by me and considered in my medical decision making (see chart for details).     Patient nontoxic or ill-appearing Vital signs stable Urine showed hemoglobin, nitrites and leuks.same as previous  No concern for pyelonephritis today Patient reported she cannot afford antibiotics until she gets paid next week.  She is requesting treatment here today and reports that she will pick her antibiotic  Up when she has the money.  Rocephin injection given here in clinic in the meantime  If she is not better despite the injection and Bactrim she will need to follow-up with urology for further management For worsening symptoms she will need to go to the ER patient understanding and agreeable to plan Final Clinical Impressions(s) / UC Diagnoses   Final diagnoses:  Lower urinary tract infectious disease     Discharge Instructions     It was nice  meeting you!!  We will treat you with another antibiotic injection We will try a different antibiotic to treat the infection Pyridium for discomfort. Or you can do AZO over the counter.  If your symptoms continue you will need to follow up with urology For worse symptoms please go to the ER.     ED Prescriptions    Medication Sig Dispense Auth. Provider   sulfamethoxazole-trimethoprim (BACTRIM DS,SEPTRA DS) 800-160 MG tablet  (Status: Discontinued) Take 1 tablet by mouth 2 (two) times daily for 7 days. 14 tablet Shavanna Furnari A, NP   phenazopyridine (PYRIDIUM) 200 MG tablet  (Status: Discontinued) Take 1 tablet (200 mg total) by mouth 3 (three) times daily. 6 tablet Comer Devins A, NP   sulfamethoxazole-trimethoprim (BACTRIM DS,SEPTRA DS) 800-160 MG tablet Take 1 tablet by mouth 2 (two) times daily for 7 days. 14 tablet [place] Santino A, NP   phenazopyridine (PYRIDIUM) 200 MG tablet Take 1 tablet (200 mg total) by mouth 3 (three) times daily. 6 tablet Loura Halt A, NP     Controlled Substance Prescriptions [place] Controlled Substance Registry consulted? Not Applicable   Orvan July, NP 09/28/18 1609

## 2018-09-28 NOTE — ED Triage Notes (Signed)
States she was here several weeks ago and dx. With UTI states she took her antibiotic as directed . States it hurts to urinate and she isn't able to hold her urine

## 2018-09-28 NOTE — Discharge Instructions (Addendum)
It was nice meeting you!!  We will treat you with another antibiotic injection We will try a different antibiotic to treat the infection Pyridium for discomfort. Or you can do AZO over the counter.  If your symptoms continue you will need to follow up with urology For worse symptoms please go to the ER.

## 2018-10-01 ENCOUNTER — Telehealth: Payer: Self-pay | Admitting: Emergency Medicine

## 2018-10-01 LAB — URINE CULTURE: Culture: >=100000 — AB

## 2018-10-01 NOTE — Telephone Encounter (Signed)
Patient treated appropriately for UTI.  Called patient, no answer, left generic message to return call.

## 2018-10-26 ENCOUNTER — Encounter (HOSPITAL_COMMUNITY): Payer: Self-pay

## 2018-10-26 ENCOUNTER — Emergency Department (HOSPITAL_COMMUNITY)
Admission: EM | Admit: 2018-10-26 | Discharge: 2018-10-26 | Disposition: A | Discharge status: Home / Self Care | Payer: Self-pay | Attending: Emergency Medicine | Admitting: Emergency Medicine

## 2018-10-26 DIAGNOSIS — N390 Urinary tract infection, site not specified: Secondary | ICD-10-CM | POA: Insufficient documentation

## 2018-10-26 DIAGNOSIS — Z79899 Other long term (current) drug therapy: Secondary | ICD-10-CM | POA: Insufficient documentation

## 2018-10-26 DIAGNOSIS — F17210 Nicotine dependence, cigarettes, uncomplicated: Secondary | ICD-10-CM | POA: Insufficient documentation

## 2018-10-26 LAB — URINALYSIS, ROUTINE W REFLEX MICROSCOPIC
Bilirubin Urine: NEGATIVE
Glucose, UA: NEGATIVE mg/dL
Ketones, ur: NEGATIVE mg/dL
Nitrite: NEGATIVE
Protein, ur: 30 mg/dL — AB
Specific Gravity, Urine: 1.011 (ref 1.005–1.030)
WBC, UA: >50 WBC/hpf — ABNORMAL HIGH (ref 0–5)
pH: 5 (ref 5.0–8.0)

## 2018-10-26 MED ORDER — PHENAZOPYRIDINE HCL 200 MG PO TABS: 200.0000 mg | ORAL_TABLET | Freq: Three times a day (TID) | ORAL | 0 refills | Status: DC

## 2018-10-26 MED ORDER — CEPHALEXIN 500 MG PO CAPS: 500.0000 mg | ORAL_CAPSULE | Freq: Four times a day (QID) | ORAL | 0 refills | Status: DC

## 2018-10-26 MED ORDER — CEPHALEXIN 250 MG PO CAPS
500.0000 mg | ORAL_CAPSULE | Freq: Once | ORAL | Status: AC
Start: 2018-10-26 — End: 2018-10-26
  Administered 2018-10-26: 500 mg via ORAL
  Filled 2018-10-26: qty 2

## 2018-10-26 MED ORDER — PHENAZOPYRIDINE HCL 100 MG PO TABS
200.0000 mg | ORAL_TABLET | Freq: Once | ORAL | Status: AC
Start: 2018-10-26 — End: 2018-10-26
  Administered 2018-10-26: 200 mg via ORAL
  Filled 2018-10-26: qty 2

## 2018-10-26 NOTE — ED Notes (Signed)
Pt not wanting to talk to this RN. MD to evaluate pt.

## 2018-10-26 NOTE — ED Notes (Signed)
Pt attempting to void.  

## 2018-10-26 NOTE — ED Notes (Signed)
Patient verbalizes understanding of discharge instructions. Opportunity for questioning and answers were provided. 

## 2018-10-26 NOTE — ED Provider Notes (Signed)
[facility] EMERGENCY DEPARTMENT Provider Note   CSN: [id number] Arrival date & time: 10/26/18  1034     History   Chief Complaint Chief Complaint  Patient presents with  . Dysuria    HPI Kimberly [name] is a 57 y.o. female.  Patient c/o dysuria and urinary urgency/frequency in the past 1-2 days. Symptoms acute onset, moderate, persistent. States feels same as prior uti. Not currently on any antibiotic therapy. Denies abd or flank/back pain. No fever or chills. Denies vaginal bleeding or discharge. No hematuria.   The history is provided by the patient.  Dysuria   Associated symptoms include urgency. Pertinent negatives include no vomiting and no flank pain.    Past Medical History:  Diagnosis Date  . Anxiety   . Bipolar 1 disorder ([place])   . Depression   . Hepatitis C     Patient Active Problem List   Diagnosis Date Noted  . Ureteral stone with hydronephrosis 07/22/2017  . Mandibular fracture, closed, initial encounter 03/21/2016  . Alcohol abuse     Past Surgical History:  Procedure Laterality Date  . ACROMIO-CLAVICULAR JOINT REPAIR Left 02/24/2015   Procedure: LEFT ACROMIO-CLAVICULAR JOINT RECONSTRUCTION;  Surgeon: Leandrew Koyanagi, MD;  Location: [facility];  Service: Orthopedics;  Laterality: Left;  . CYSTOSCOPY W/ URETERAL STENT PLACEMENT Left 07/22/2017   Procedure: CYSTOSCOPY WITH RETROGRADE PYELOGRAM/URETERAL STENT PLACEMENT;  Surgeon: Alexis Frock, MD;  Location: [facility];  Service: Urology;  Laterality: Left;  . ORIF MANDIBULAR FRACTURE N/A 03/22/2016   Procedure: OPEN REDUCTION INTERNAL FIXATION (ORIF) MANDIBULAR FRACTURE;  Surgeon: Melissa Montane, MD;  Location: [facility];  Service: ENT;  Laterality: N/A;     OB History   None      Home Medications    Prior to Admission medications   Medication Sig Start Date End Date Taking? Authorizing Provider  Aspirin-Caffeine (BC FAST PAIN RELIEF PO) Take 1 packet by mouth daily as needed (for paiin).     [provider]  feeding supplement, ENSURE ENLIVE, (ENSURE ENLIVE) LIQD Take 237 mLs by mouth 2 (two) times daily between meals. Patient not taking: Reported on 07/22/2017 03/26/16   Lavina Hamman, MD  FLUoxetine (PROZAC) 20 MG tablet Take 1 tablet (20 mg total) by mouth daily. Patient not taking: Reported on 07/22/2017 08/25/14   Carmin Muskrat, MD  folic acid (FOLVITE) 1 MG tablet Take 1 tablet (1 mg total) by mouth daily. Patient not taking: Reported on 07/22/2017 03/26/16   Lavina Hamman, MD  methocarbamol (ROBAXIN) 500 MG tablet Take 1 tablet (500 mg total) by mouth every 6 (six) hours as needed for muscle spasms. Patient not taking: Reported on 07/22/2017 03/26/16   Lavina Hamman, MD  omeprazole (PRILOSEC) 20 MG capsule Take 1 capsule (20 mg total) by mouth daily. Patient not taking: Reported on 07/22/2017 08/22/16   Waynetta Pean, PA-C  ondansetron (ZOFRAN ODT) 4 MG disintegrating tablet Take 1 tablet (4 mg total) by mouth every 8 (eight) hours as needed for nausea or vomiting. Patient not taking: Reported on 07/22/2017 08/22/16   Waynetta Pean, PA-C  ondansetron (ZOFRAN) 4 MG tablet Take 1 tablet (4 mg total) by mouth every 6 (six) hours. Patient not taking: Reported on 07/22/2017 04/27/16   Mesner, Corene Cornea, MD  oxyCODONE (OXY IR/ROXICODONE) 5 MG immediate release tablet Take 1 tablet (5 mg total) by mouth every 4 (four) hours as needed for moderate pain. 07/27/17   McKenzie, Candee Furbish, MD  phenazopyridine (PYRIDIUM) 200 MG  tablet Take 1 tablet (200 mg total) by mouth 3 (three) times daily. 09/28/18   Loura Halt A, NP  ranitidine (ZANTAC) 150 MG capsule Take 1 capsule (150 mg total) by mouth daily. Patient not taking: Reported on 07/22/2017 08/22/16   Waynetta Pean, PA-C  risperiDONE (RISPERDAL) 2 MG tablet Take 1 tablet (2 mg total) by mouth daily. Patient not taking: Reported on 07/22/2017 08/25/14   Carmin Muskrat, MD  tamsulosin (FLOMAX) 0.4 MG CAPS capsule Take 1 capsule (0.4 mg  total) by mouth daily after supper. 02/16/18   Katheren Shams, DO  thiamine 100 MG tablet Take 1 tablet (100 mg total) by mouth daily. Patient not taking: Reported on 08/22/2016 03/26/16   Lavina Hamman, MD  traZODone (DESYREL) 100 MG tablet Take 100 mg by mouth at bedtime.    [provider]    Family History No family history on file.  Social History Social History   Tobacco Use  . Smoking status: Current Every Day Smoker    Types: Cigarettes  . Smokeless tobacco: Never Used  Substance Use Topics  . Alcohol use: Yes  . Drug use: Not Currently    Comment: Pt denies substance use during this TTS assessment on 08-25-14 UDS + for [facility]     Allergies   Patient has no known allergies.   Review of Systems Review of Systems  Constitutional: Negative for fever.  HENT: Negative for sore throat.   Eyes: Negative for redness.  Respiratory: Negative for shortness of breath.   Cardiovascular: Negative for chest pain.  Gastrointestinal: Negative for abdominal pain and vomiting.  Genitourinary: Positive for dysuria and urgency. Negative for flank pain, vaginal bleeding and vaginal discharge.  Musculoskeletal: Negative for back pain.  Skin: Negative for rash.  Neurological: Negative for headaches.  Hematological: Does not bruise/bleed easily.  Psychiatric/Behavioral: Negative for confusion.     Physical Exam Updated Vital Signs BP 133/79 (BP Location: Right Arm)   Pulse 92   Temp 98.4 F (36.9 C) (Oral)   Resp 16   LMP 10/09/2012   SpO2 100%   Physical Exam  Constitutional: She appears well-developed and well-nourished.  HENT:  Head: Atraumatic.  Eyes: Conjunctivae are normal. No scleral icterus.  Neck: Neck supple. No tracheal deviation present.  Cardiovascular: Normal rate, regular rhythm, normal heart sounds and intact distal pulses.  Pulmonary/Chest: Effort normal and breath sounds normal. No respiratory distress.  Abdominal: Soft. Normal appearance and bowel  sounds are normal. She exhibits no distension and no mass. There is no tenderness. There is no guarding.  Genitourinary:  Genitourinary Comments: No cva tenderness  Musculoskeletal: She exhibits no edema.  Neurological: She is alert.  Skin: Skin is warm and dry. No rash noted.  Psychiatric: She has a normal mood and affect.  Nursing note and vitals reviewed.    ED Treatments / Results  Labs (all labs ordered are listed, but only abnormal results are displayed) Results for orders placed or performed during the hospital encounter of 10/26/18  Urinalysis, Routine w reflex microscopic  Result Value Ref Range   Color, Urine YELLOW YELLOW   APPearance CLOUDY (A) CLEAR   Specific Gravity, Urine 1.011 1.005 - 1.030   pH 5.0 5.0 - 8.0   Glucose, UA NEGATIVE NEGATIVE mg/dL   Hgb urine dipstick MODERATE (A) NEGATIVE   Bilirubin Urine NEGATIVE NEGATIVE   Ketones, ur NEGATIVE NEGATIVE mg/dL   Protein, ur 30 (A) NEGATIVE mg/dL   Nitrite NEGATIVE NEGATIVE   Leukocytes, UA  LARGE (A) NEGATIVE   RBC / HPF 21-50 0 - 5 RBC/hpf   WBC, UA >50 (H) 0 - 5 WBC/hpf   Bacteria, UA MANY (A) NONE SEEN   WBC Clumps PRESENT    Mucus PRESENT     EKG None  Radiology No results found.  Procedures Procedures (including critical care time)  Medications Ordered in ED Medications - No data to display   Initial Impression / Assessment and Plan / ED Course  I have reviewed the triage vital signs and the nursing notes.  Pertinent labs & imaging results that were available during my care of the patient were reviewed by me and considered in my medical decision making (see chart for details).  Lab sent.  Reviewed nursing notes and prior charts for additional history.   Pt is eating and drinking in room, no nv. abd soft nt.   ua is positive.   Keflex po. Pyridium po.  Vitals normal. Afebrile. abd soft nt.   Pt appears stable for d/c.     Final Clinical Impressions(s) / ED Diagnoses   Final  diagnoses:  None    ED Discharge Orders    None       Lajean Saver, MD 10/26/18 1414

## 2018-10-26 NOTE — ED Triage Notes (Signed)
Pt presents for evaluation of ongoing dysuria, has been treated for multiple UTIs over the past few months.

## 2018-10-26 NOTE — Discharge Instructions (Addendum)
It was our pleasure to provide your ER care today - we hope that you feel better.  Take antibiotic (keflex) as prescribed.   Take pyridium as need for bladder spasm/pain. Take acetaminophen and/or ibuprofen as need.   For recurrent urine infections, follow up with urologist in 1 week - call office to arrange appointment.  Return to ER if worse, new symptoms, high fevers, severe abdominal pain, persistent vomiting, other concern.

## 2018-11-17 ENCOUNTER — Other Ambulatory Visit: Payer: Self-pay

## 2018-11-17 ENCOUNTER — Inpatient Hospital Stay (HOSPITAL_COMMUNITY)
Admission: EM | Admit: 2018-11-17 | Discharge: 2018-11-21 | DRG: 690 | Disposition: A | Discharge status: Home / Self Care | Payer: Self-pay | Attending: Family Medicine | Admitting: Family Medicine

## 2018-11-17 ENCOUNTER — Emergency Department (HOSPITAL_COMMUNITY): Payer: Self-pay

## 2018-11-17 ENCOUNTER — Encounter (HOSPITAL_COMMUNITY): Payer: Self-pay | Admitting: Emergency Medicine

## 2018-11-17 DIAGNOSIS — N12 Tubulo-interstitial nephritis, not specified as acute or chronic: Secondary | ICD-10-CM

## 2018-11-17 DIAGNOSIS — K219 Gastro-esophageal reflux disease without esophagitis: Secondary | ICD-10-CM | POA: Diagnosis present

## 2018-11-17 DIAGNOSIS — Z7982 Long term (current) use of aspirin: Secondary | ICD-10-CM

## 2018-11-17 DIAGNOSIS — Z87442 Personal history of urinary calculi: Secondary | ICD-10-CM

## 2018-11-17 DIAGNOSIS — F17200 Nicotine dependence, unspecified, uncomplicated: Secondary | ICD-10-CM

## 2018-11-17 DIAGNOSIS — R000 Tachycardia, unspecified: Secondary | ICD-10-CM | POA: Diagnosis present

## 2018-11-17 DIAGNOSIS — Y848 Other medical procedures as the cause of abnormal reaction of the patient, or of later complication, without mention of misadventure at the time of the procedure: Secondary | ICD-10-CM | POA: Diagnosis present

## 2018-11-17 DIAGNOSIS — F1010 Alcohol abuse, uncomplicated: Secondary | ICD-10-CM | POA: Diagnosis present

## 2018-11-17 DIAGNOSIS — Z79891 Long term (current) use of opiate analgesic: Secondary | ICD-10-CM

## 2018-11-17 DIAGNOSIS — B9561 Methicillin susceptible Staphylococcus aureus infection as the cause of diseases classified elsewhere: Secondary | ICD-10-CM | POA: Diagnosis present

## 2018-11-17 DIAGNOSIS — F419 Anxiety disorder, unspecified: Secondary | ICD-10-CM | POA: Diagnosis present

## 2018-11-17 DIAGNOSIS — Z87440 Personal history of urinary (tract) infections: Secondary | ICD-10-CM

## 2018-11-17 DIAGNOSIS — T8389XA Other specified complication of genitourinary prosthetic devices, implants and grafts, initial encounter: Secondary | ICD-10-CM | POA: Diagnosis present

## 2018-11-17 DIAGNOSIS — F17210 Nicotine dependence, cigarettes, uncomplicated: Secondary | ICD-10-CM | POA: Diagnosis present

## 2018-11-17 DIAGNOSIS — N119 Chronic tubulo-interstitial nephritis, unspecified: Principal | ICD-10-CM | POA: Diagnosis present

## 2018-11-17 DIAGNOSIS — N132 Hydronephrosis with renal and ureteral calculous obstruction: Secondary | ICD-10-CM | POA: Diagnosis present

## 2018-11-17 DIAGNOSIS — Z79899 Other long term (current) drug therapy: Secondary | ICD-10-CM

## 2018-11-17 DIAGNOSIS — F1290 Cannabis use, unspecified, uncomplicated: Secondary | ICD-10-CM | POA: Diagnosis present

## 2018-11-17 DIAGNOSIS — E876 Hypokalemia: Secondary | ICD-10-CM | POA: Diagnosis present

## 2018-11-17 DIAGNOSIS — N1330 Unspecified hydronephrosis: Secondary | ICD-10-CM

## 2018-11-17 DIAGNOSIS — B1920 Unspecified viral hepatitis C without hepatic coma: Secondary | ICD-10-CM | POA: Diagnosis present

## 2018-11-17 DIAGNOSIS — F3130 Bipolar disorder, current episode depressed, mild or moderate severity, unspecified: Secondary | ICD-10-CM | POA: Diagnosis present

## 2018-11-17 LAB — CBC WITH DIFFERENTIAL/PLATELET
Abs Immature Granulocytes: 0.08 10*3/uL — ABNORMAL HIGH (ref 0.00–0.07)
Basophils Absolute: 0.1 10*3/uL (ref 0.0–0.1)
Basophils Relative: 0 %
Eosinophils Absolute: 0.3 10*3/uL (ref 0.0–0.5)
Eosinophils Relative: 2 %
HCT: 42.6 % (ref 36.0–46.0)
Hemoglobin: 14.1 g/dL (ref 12.0–15.0)
Immature Granulocytes: 1 %
Lymphocytes Relative: 9 %
Lymphs Abs: 1.5 10*3/uL (ref 0.7–4.0)
MCH: 34.3 pg — ABNORMAL HIGH (ref 26.0–34.0)
MCHC: 33.1 g/dL (ref 30.0–36.0)
MCV: 103.6 fL — ABNORMAL HIGH (ref 80.0–100.0)
Monocytes Absolute: 2.6 10*3/uL — ABNORMAL HIGH (ref 0.1–1.0)
Monocytes Relative: 16 %
Neutro Abs: 11.7 10*3/uL — ABNORMAL HIGH (ref 1.7–7.7)
Neutrophils Relative %: 72 %
Platelets: 335 10*3/uL (ref 150–400)
RBC: 4.11 MIL/uL (ref 3.87–5.11)
RDW: 13.6 % (ref 11.5–15.5)
WBC: 16.2 10*3/uL — ABNORMAL HIGH (ref 4.0–10.5)
nRBC: 0 % (ref 0.0–0.2)

## 2018-11-17 LAB — URINALYSIS, ROUTINE W REFLEX MICROSCOPIC
Bilirubin Urine: NEGATIVE
Glucose, UA: NEGATIVE mg/dL
Ketones, ur: 20 mg/dL — AB
Nitrite: NEGATIVE
Protein, ur: 100 mg/dL — AB
Specific Gravity, Urine: 1.023 (ref 1.005–1.030)
WBC, UA: >50 WBC/hpf — ABNORMAL HIGH (ref 0–5)
pH: 5 (ref 5.0–8.0)

## 2018-11-17 LAB — COMPREHENSIVE METABOLIC PANEL
ALT: 26 U/L (ref 0–44)
AST: 30 U/L (ref 15–41)
Albumin: 3.6 g/dL (ref 3.5–5.0)
Alkaline Phosphatase: 83 U/L (ref 38–126)
Anion gap: 16 — ABNORMAL HIGH (ref 5–15)
BUN: 12 mg/dL (ref 6–20)
CO2: 15 mmol/L — ABNORMAL LOW (ref 22–32)
Calcium: 9.1 mg/dL (ref 8.9–10.3)
Chloride: 104 mmol/L (ref 98–111)
Creatinine, Ser: 1.01 mg/dL — ABNORMAL HIGH (ref 0.44–1.00)
GFR calc Af Amer: >60 mL/min (ref >60)
GFR calc non Af Amer: >60 mL/min (ref >60)
Glucose, Bld: 118 mg/dL — ABNORMAL HIGH (ref 70–99)
Potassium: 3.8 mmol/L (ref 3.5–5.1)
Sodium: 135 mmol/L (ref 135–145)
Total Bilirubin: 0.4 mg/dL (ref 0.3–1.2)
Total Protein: 8.4 g/dL — ABNORMAL HIGH (ref 6.5–8.1)

## 2018-11-17 LAB — LIPASE, BLOOD: Lipase: 22 U/L (ref 11–51)

## 2018-11-17 MED ORDER — ONDANSETRON HCL 4 MG/2ML IJ SOLN
4.0000 mg | Freq: Once | INTRAMUSCULAR | Status: AC
  Administered 2018-11-17: 4 mg via INTRAVENOUS
  Filled 2018-11-17: qty 2

## 2018-11-17 MED ORDER — NICOTINE 14 MG/24HR TD PT24
14.0000 mg | MEDICATED_PATCH | Freq: Every day | TRANSDERMAL | Status: DC
  Administered 2018-11-17 – 2018-11-21 (×5): 14 mg via TRANSDERMAL
  Filled 2018-11-17 (×5): qty 1

## 2018-11-17 MED ORDER — LORAZEPAM 2 MG/ML IJ SOLN: 1.0000 mg | Freq: Four times a day (QID) | INTRAMUSCULAR | Status: DC | PRN

## 2018-11-17 MED ORDER — THIAMINE HCL 100 MG/ML IJ SOLN
100.0000 mg | Freq: Every day | INTRAMUSCULAR | Status: DC
  Filled 2018-11-17 (×2): qty 2

## 2018-11-17 MED ORDER — ONDANSETRON HCL 4 MG PO TABS: 4.0000 mg | ORAL_TABLET | Freq: Four times a day (QID) | ORAL | Status: DC | PRN

## 2018-11-17 MED ORDER — LORAZEPAM 1 MG PO TABS: 0.0000 mg | ORAL_TABLET | Freq: Two times a day (BID) | ORAL | Status: DC

## 2018-11-17 MED ORDER — SODIUM CHLORIDE 0.9 % IV SOLN
1.0000 g | Freq: Once | INTRAVENOUS | Status: AC
  Administered 2018-11-17: 1 g via INTRAVENOUS
  Filled 2018-11-17: qty 10

## 2018-11-17 MED ORDER — LORAZEPAM 1 MG PO TABS: 0.0000 mg | ORAL_TABLET | Freq: Four times a day (QID) | ORAL | Status: DC

## 2018-11-17 MED ORDER — MORPHINE SULFATE (PF) 4 MG/ML IV SOLN
4.0000 mg | INTRAVENOUS | Status: AC | PRN
  Administered 2018-11-17 – 2018-11-18 (×4): 4 mg via INTRAVENOUS
  Filled 2018-11-17 (×4): qty 1

## 2018-11-17 MED ORDER — ADULT MULTIVITAMIN W/MINERALS CH
1.0000 | ORAL_TABLET | Freq: Every day | ORAL | Status: DC
  Administered 2018-11-17 – 2018-11-20 (×4): 1 via ORAL
  Filled 2018-11-17 (×5): qty 1

## 2018-11-17 MED ORDER — LORAZEPAM 1 MG PO TABS: 1.0000 mg | ORAL_TABLET | Freq: Four times a day (QID) | ORAL | Status: DC | PRN

## 2018-11-17 MED ORDER — ACETAMINOPHEN 650 MG RE SUPP: 650.0000 mg | Freq: Four times a day (QID) | RECTAL | Status: DC | PRN

## 2018-11-17 MED ORDER — FOLIC ACID 1 MG PO TABS
1.0000 mg | ORAL_TABLET | Freq: Every day | ORAL | Status: DC
  Administered 2018-11-17 – 2018-11-20 (×4): 1 mg via ORAL
  Filled 2018-11-17 (×5): qty 1

## 2018-11-17 MED ORDER — HYDROMORPHONE HCL 1 MG/ML IJ SOLN
0.5000 mg | INTRAMUSCULAR | Status: DC | PRN
  Administered 2018-11-18 (×4): 0.5 mg via INTRAVENOUS
  Filled 2018-11-17 (×4): qty 1

## 2018-11-17 MED ORDER — VITAMIN B-1 100 MG PO TABS
100.0000 mg | ORAL_TABLET | Freq: Every day | ORAL | Status: DC
  Administered 2018-11-17 – 2018-11-20 (×4): 100 mg via ORAL
  Filled 2018-11-17 (×5): qty 1

## 2018-11-17 MED ORDER — SODIUM CHLORIDE 0.9 % IV SOLN
1.0000 g | INTRAVENOUS | Status: DC
  Administered 2018-11-18: 1 g via INTRAVENOUS
  Filled 2018-11-17 (×2): qty 10

## 2018-11-17 MED ORDER — ACETAMINOPHEN 325 MG PO TABS: 650.0000 mg | ORAL_TABLET | Freq: Four times a day (QID) | ORAL | Status: DC | PRN

## 2018-11-17 MED ORDER — SODIUM CHLORIDE 0.9 % IV BOLUS
1000.0000 mL | Freq: Once | INTRAVENOUS | Status: AC
  Administered 2018-11-17: 1000 mL via INTRAVENOUS

## 2018-11-17 MED ORDER — ONDANSETRON HCL 4 MG/2ML IJ SOLN: 4.0000 mg | Freq: Four times a day (QID) | INTRAMUSCULAR | Status: DC | PRN

## 2018-11-17 MED ORDER — ENOXAPARIN SODIUM 40 MG/0.4ML ~~LOC~~ SOLN: 40.0000 mg | SUBCUTANEOUS | Status: DC

## 2018-11-17 NOTE — Consult Note (Addendum)
Consultation: Left renal stones, left hydronephrosis, urinary tract infection  Requested by: Dr. Jola Schmidt   History of Present Illness: [name] is a 57 year old female who underwent an urgent left stent August 2018 for a proximal left 9 mm stone.  She did not follow-up.  It appears she was sent a certified letter.  For several months she has had suprapubic discomfort, frequency urgency and dysuria.  She is had recurrent urinary tract infection.  CT scan of the abdomen and pelvis was obtained which showed the left stent has encrusted with about a 2 cm stone on the bladder coil and a 2 cm stone on the coil in her renal pelvis.  There was left hydronephrosis.  Her white count was 16, bicarb 15, creatinine 1.01.  Urinalysis consistent with infection and culture pending.  Past Medical History:  Diagnosis Date  . Anxiety   . Bipolar 1 disorder ([employer])   . Depression   . Hepatitis C    Past Surgical History:  Procedure Laterality Date  . ACROMIO-CLAVICULAR JOINT REPAIR Left 02/24/2015   Procedure: LEFT ACROMIO-CLAVICULAR JOINT RECONSTRUCTION;  Surgeon: Leandrew Koyanagi, MD;  Location: [facility];  Service: Orthopedics;  Laterality: Left;  . CYSTOSCOPY W/ URETERAL STENT PLACEMENT Left 07/22/2017   Procedure: CYSTOSCOPY WITH RETROGRADE PYELOGRAM/URETERAL STENT PLACEMENT;  Surgeon: Alexis Frock, MD;  Location: [facility];  Service: Urology;  Laterality: Left;  . ORIF MANDIBULAR FRACTURE N/A 03/22/2016   Procedure: OPEN REDUCTION INTERNAL FIXATION (ORIF) MANDIBULAR FRACTURE;  Surgeon: Melissa Montane, MD;  Location: [facility];  Service: ENT;  Laterality: N/A;    Home Medications:  (Not in a hospital admission)  Allergies: No Known Allergies  History reviewed. No pertinent family history. Social History:  reports that she has been smoking cigarettes. She has never used smokeless tobacco. She reports current alcohol use. She reports previous drug use.  ROS: A complete review of systems was performed.  All systems are  negative except for pertinent findings as noted. Review of Systems  Gastrointestinal: Positive for abdominal pain.  Genitourinary: Positive for dysuria.     Physical Exam: Sister and FM Dr. present  Vital signs in last 24 hours: Temp:  [98 F (36.7 C)] 98 F (36.7 C) (12/24 1455) Pulse Rate:  [90-116] 94 (12/24 1845) Resp:  [16] 16 (12/24 1455) BP: (129-156)/(68-104) 143/78 (12/24 1845) SpO2:  [92 %-99 %] 96 % (12/24 1845) General:  Alert and oriented, No acute distress HEENT: Normocephalic, atraumatic Neck: No JVD or lymphadenopathy Cardiovascular: Regular rate and rhythm Lungs: Regular rate and effort Abdomen: Soft, nondistended, no abdominal masses, she is diffusely tender everywhere you touch her on the abdomen or her back/CVA Extremities: No edema Neurologic: Grossly intact  Laboratory Data:  Results for orders placed or performed during the hospital encounter of 11/17/18 (from the past 24 hour(s))  CBC with Differential/Platelet     Status: Abnormal   Collection Time: 11/17/18  3:07 PM  Result Value Ref Range   WBC 16.2 (H) 4.0 - 10.5 K/uL   RBC 4.11 3.87 - 5.11 MIL/uL   Hemoglobin 14.1 12.0 - 15.0 g/dL   HCT 42.6 36.0 - 46.0 %   MCV 103.6 (H) 80.0 - 100.0 fL   MCH 34.3 (H) 26.0 - 34.0 pg   MCHC 33.1 30.0 - 36.0 g/dL   RDW 13.6 11.5 - 15.5 %   Platelets 335 150 - 400 K/uL   nRBC 0.0 0.0 - 0.2 %   Neutrophils Relative % 72 %   Neutro  Abs 11.7 (H) 1.7 - 7.7 K/uL   Lymphocytes Relative 9 %   Lymphs Abs 1.5 0.7 - 4.0 K/uL   Monocytes Relative 16 %   Monocytes Absolute 2.6 (H) 0.1 - 1.0 K/uL   Eosinophils Relative 2 %   Eosinophils Absolute 0.3 0.0 - 0.5 K/uL   Basophils Relative 0 %   Basophils Absolute 0.1 0.0 - 0.1 K/uL   Immature Granulocytes 1 %   Abs Immature Granulocytes 0.08 (H) 0.00 - 0.07 K/uL  Comprehensive metabolic panel     Status: Abnormal   Collection Time: 11/17/18  3:07 PM  Result Value Ref Range   Sodium 135 135 - 145 mmol/L   Potassium 3.8  3.5 - 5.1 mmol/L   Chloride 104 98 - 111 mmol/L   CO2 15 (L) 22 - 32 mmol/L   Glucose, Bld 118 (H) 70 - 99 mg/dL   BUN 12 6 - 20 mg/dL   Creatinine, Ser 1.01 (H) 0.44 - 1.00 mg/dL   Calcium 9.1 8.9 - 10.3 mg/dL   Total Protein 8.4 (H) 6.5 - 8.1 g/dL   Albumin 3.6 3.5 - 5.0 g/dL   AST 30 15 - 41 U/L   ALT 26 0 - 44 U/L   Alkaline Phosphatase 83 38 - 126 U/L   Total Bilirubin 0.4 0.3 - 1.2 mg/dL   GFR calc non Af Amer >60 >60 mL/min   GFR calc Af Amer >60 >60 mL/min   Anion gap 16 (H) 5 - 15  Lipase, blood     Status: None   Collection Time: 11/17/18  3:07 PM  Result Value Ref Range   Lipase 22 11 - 51 U/L  Urinalysis, Routine w reflex microscopic     Status: Abnormal   Collection Time: 11/17/18  4:32 PM  Result Value Ref Range   Color, Urine AMBER (A) YELLOW   APPearance TURBID (A) CLEAR   Specific Gravity, Urine 1.023 1.005 - 1.030   pH 5.0 5.0 - 8.0   Glucose, UA NEGATIVE NEGATIVE mg/dL   Hgb urine dipstick MODERATE (A) NEGATIVE   Bilirubin Urine NEGATIVE NEGATIVE   Ketones, ur 20 (A) NEGATIVE mg/dL   Protein, ur 100 (A) NEGATIVE mg/dL   Nitrite NEGATIVE NEGATIVE   Leukocytes, UA LARGE (A) NEGATIVE   RBC / HPF 0-5 0 - 5 RBC/hpf   WBC, UA >50 (H) 0 - 5 WBC/hpf   Bacteria, UA MANY (A) NONE SEEN   No results found for this or any previous visit (from the past 240 hour(s)). Creatinine: Recent Labs    11/17/18 1507  CREATININE 1.01*    Impression/Assessment: -Pyelonephritis- broad-spectrum antibiotics, cultures pending -Left hydronephrosis- likely from occluded left ureteral stent, could also be from reflux, plan left nephrostomy tube.  I discussed with the patient and her sister the nature risk benefits and alternatives to left nephrostomy tube. -Left renal stones, bladder stone- she will ultimately need a cystoscopy with cystolitholopaxy to free the stent in the bladder and the left PCNL to rid the kidney of stones and the stent in the kidney.  The nephrostomy tube can  be utilized for access.  Plan: -NPO p MN -no blood thinners -- can resume/start tomorrow after procedure if urine clear  -left Nx tube in AM    I discussed patient with FM Dr. and called and discussed with Dr. Annamaria Boots.  In the long run the patient admits that she knows she did not follow-up and needed to.  We discussed failure to follow-up  again can lead to life-threatening infection, loss and removal of the left kidney.  We discussed plan for nephrostomy tube and long-term need for combined cystoscopic and percutaneous approach to rid her of the stent in the stones.  Also discussed with her sister.  Festus Aloe 11/17/2018, 7:37 PM  chart reviewed, full consult to follow --

## 2018-11-17 NOTE — ED Provider Notes (Signed)
[facility] EMERGENCY DEPARTMENT Provider Note   CSN: [id number] Arrival date & time: 11/17/18  1425     History   Chief Complaint Chief Complaint  Patient presents with  . Recurrent UTI    HPI Kimberly [name] is a 57 y.o. female.  HPI 56 year old female who presents the emergency department with complaints of recurrent dysuria and urinary frequency with associated nausea vomiting.  Denies fever but does report chills at home.  She reports her worsening symptoms began around 48 hours ago.  She was seen in early December and diagnosed with UTI at that time.  She states she had some improvement but never complete improvement and now her symptoms have been worsening over the past several days.  She denies significant headache.  Denies focal abdominal pain.  She reports some mild bilateral flank pain.  She does have a history of ureteral stones s/p ureteral stent in 2018. She reports she never followed up to have the stent removed.    Past Medical History:  Diagnosis Date  . Anxiety   . Bipolar 1 disorder ([place])   . Depression   . Hepatitis C     Patient Active Problem List   Diagnosis Date Noted  . Ureteral stone with hydronephrosis 07/22/2017  . Mandibular fracture, closed, initial encounter 03/21/2016  . Alcohol abuse     Past Surgical History:  Procedure Laterality Date  . ACROMIO-CLAVICULAR JOINT REPAIR Left 02/24/2015   Procedure: LEFT ACROMIO-CLAVICULAR JOINT RECONSTRUCTION;  Surgeon: Leandrew Koyanagi, MD;  Location: [facility];  Service: Orthopedics;  Laterality: Left;  . CYSTOSCOPY W/ URETERAL STENT PLACEMENT Left 07/22/2017   Procedure: CYSTOSCOPY WITH RETROGRADE PYELOGRAM/URETERAL STENT PLACEMENT;  Surgeon: Alexis Frock, MD;  Location: [facility];  Service: Urology;  Laterality: Left;  . ORIF MANDIBULAR FRACTURE N/A 03/22/2016   Procedure: OPEN REDUCTION INTERNAL FIXATION (ORIF) MANDIBULAR FRACTURE;  Surgeon: Melissa Montane, MD;  Location: [facility];  Service: ENT;   Laterality: N/A;     OB History   No obstetric history on file.      Home Medications    Prior to Admission medications   Medication Sig Start Date End Date Taking? Authorizing Provider  Aspirin-Caffeine (BC FAST PAIN RELIEF PO) Take 1 packet by mouth daily as needed (for paiin).   Yes [provider]  cephALEXin (KEFLEX) 500 MG capsule Take 1 capsule (500 mg total) by mouth 4 (four) times daily. Patient not taking: Reported on 11/17/2018 10/26/18   Lajean Saver, MD  feeding supplement, ENSURE ENLIVE, (ENSURE ENLIVE) LIQD Take 237 mLs by mouth 2 (two) times daily between meals. Patient not taking: Reported on 07/22/2017 03/26/16   Lavina Hamman, MD  FLUoxetine (PROZAC) 20 MG tablet Take 1 tablet (20 mg total) by mouth daily. Patient not taking: Reported on 07/22/2017 08/25/14   Carmin Muskrat, MD  folic acid (FOLVITE) 1 MG tablet Take 1 tablet (1 mg total) by mouth daily. Patient not taking: Reported on 07/22/2017 03/26/16   Lavina Hamman, MD  methocarbamol (ROBAXIN) 500 MG tablet Take 1 tablet (500 mg total) by mouth every 6 (six) hours as needed for muscle spasms. Patient not taking: Reported on 07/22/2017 03/26/16   Lavina Hamman, MD  omeprazole (PRILOSEC) 20 MG capsule Take 1 capsule (20 mg total) by mouth daily. Patient not taking: Reported on 07/22/2017 08/22/16   Waynetta Pean, PA-C  ondansetron (ZOFRAN ODT) 4 MG disintegrating tablet Take 1 tablet (4 mg total) by mouth every 8 (eight) hours as  needed for nausea or vomiting. Patient not taking: Reported on 07/22/2017 08/22/16   Waynetta Pean, PA-C  ondansetron (ZOFRAN) 4 MG tablet Take 1 tablet (4 mg total) by mouth every 6 (six) hours. Patient not taking: Reported on 07/22/2017 04/27/16   Mesner, Corene Cornea, MD  oxyCODONE (OXY IR/ROXICODONE) 5 MG immediate release tablet Take 1 tablet (5 mg total) by mouth every 4 (four) hours as needed for moderate pain. Patient not taking: Reported on 11/17/2018 07/27/17   Cleon Gustin, MD   phenazopyridine (PYRIDIUM) 200 MG tablet Take 1 tablet (200 mg total) by mouth 3 (three) times daily. Patient not taking: Reported on 11/17/2018 09/28/18   Loura Halt A, NP  phenazopyridine (PYRIDIUM) 200 MG tablet Take 1 tablet (200 mg total) by mouth 3 (three) times daily. Patient not taking: Reported on 11/17/2018 10/26/18   Lajean Saver, MD  ranitidine (ZANTAC) 150 MG capsule Take 1 capsule (150 mg total) by mouth daily. Patient not taking: Reported on 07/22/2017 08/22/16   Waynetta Pean, PA-C  risperiDONE (RISPERDAL) 2 MG tablet Take 1 tablet (2 mg total) by mouth daily. Patient not taking: Reported on 07/22/2017 08/25/14   Carmin Muskrat, MD  tamsulosin (FLOMAX) 0.4 MG CAPS capsule Take 1 capsule (0.4 mg total) by mouth daily after supper. Patient not taking: Reported on 11/17/2018 02/16/18   Katheren Shams, DO  thiamine 100 MG tablet Take 1 tablet (100 mg total) by mouth daily. Patient not taking: Reported on 08/22/2016 03/26/16   Lavina Hamman, MD    Family History History reviewed. No pertinent family history.  Social History Social History   Tobacco Use  . Smoking status: Current Every Day Smoker    Types: Cigarettes  . Smokeless tobacco: Never Used  Substance Use Topics  . Alcohol use: Yes  . Drug use: Not Currently    Comment: Pt denies substance use during this TTS assessment on 08-25-14 UDS + for [facility]     Allergies   Patient has no known allergies.   Review of Systems Review of Systems  All other systems reviewed and are negative.    Physical Exam Updated Vital Signs BP (!) 156/68   Pulse 90   Temp 98 F (36.7 C) (Oral)   Resp 16   LMP 10/09/2012   SpO2 98%   Physical Exam Vitals signs and nursing note reviewed.  Constitutional:      General: She is not in acute distress.    Appearance: She is well-developed.  HENT:     Head: Normocephalic and atraumatic.  Neck:     Musculoskeletal: Normal range of motion.  Cardiovascular:     Rate and Rhythm:  Normal rate and regular rhythm.     Heart sounds: Normal heart sounds.  Pulmonary:     Effort: Pulmonary effort is normal.     Breath sounds: Normal breath sounds.  Abdominal:     General: There is no distension.     Palpations: Abdomen is soft.     Tenderness: There is no abdominal tenderness.  Musculoskeletal: Normal range of motion.  Skin:    General: Skin is warm and dry.  Neurological:     Mental Status: She is alert and oriented to person, place, and time.  Psychiatric:        Judgment: Judgment normal.      ED Treatments / Results  Labs (all labs ordered are listed, but only abnormal results are displayed) Labs Reviewed  CBC WITH DIFFERENTIAL/PLATELET - Abnormal; Notable for the  following components:      Result Value   WBC 16.2 (*)    MCV 103.6 (*)    MCH 34.3 (*)    Neutro Abs 11.7 (*)    Monocytes Absolute 2.6 (*)    Abs Immature Granulocytes 0.08 (*)    All other components within normal limits  COMPREHENSIVE METABOLIC PANEL - Abnormal; Notable for the following components:   CO2 15 (*)    Glucose, Bld 118 (*)    Creatinine, Ser 1.01 (*)    Total Protein 8.4 (*)    Anion gap 16 (*)    All other components within normal limits  URINE CULTURE  LIPASE, BLOOD  URINALYSIS, ROUTINE W REFLEX MICROSCOPIC    EKG None  Radiology Ct Renal Stone Study  Result Date: 11/17/2018 CLINICAL DATA:  Abdominal and flank pain for 2 days. Nephrolithiasis. EXAM: CT ABDOMEN AND PELVIS WITHOUT CONTRAST TECHNIQUE: Multidetector CT imaging of the abdomen and pelvis was performed following the standard protocol without IV contrast. COMPARISON:  07/22/2017 FINDINGS: Lower chest: No acute findings. Hepatobiliary: No mass visualized on this unenhanced exam. Gallbladder is unremarkable. Pancreas: No mass or inflammatory process visualized on this unenhanced exam. Spleen:  Within normal limits in size. Adrenals/Urinary tract: Adrenal glands and right kidney are unremarkable. Severe left  hydronephrosis shows no significant change. A left ureteral stent is now seen in appropriate position, however there is calcification seen surrounding the proximal and distal pigtail loops, raising suspicion for stent occlusion. Two new calculi are seen in the lower pole collecting system of the left kidney, largest measuring 9 mm. Stomach/Bowel: No evidence of obstruction, inflammatory process, or abnormal fluid collections. Vascular/Lymphatic: No pathologically enlarged lymph nodes identified. No evidence of abdominal aortic aneurysm. Aortic atherosclerosis. Reproductive: Partially calcified posterior uterine fibroid again seen measuring 3.2 cm. Adnexal regions are unremarkable. Other:  None. Musculoskeletal:  No suspicious bone lesions identified. IMPRESSION: Stable severe left hydronephrosis despite left ureteral stent in appropriate position. Calcification is seen surrounding the proximal and distal pigtail loops, suspicious for stent occlusion. Nonobstructing calculi in left lower pole renal collecting system, largest measuring 9 mm. Stable small calcified uterine fibroid. Electronically Signed   By: Earle Gell M.D.   On: 11/17/2018 16:45    Procedures Procedures (including critical care time)  Medications Ordered in ED Medications  morphine 4 MG/ML injection 4 mg (4 mg Intravenous Given 11/17/18 1529)  ondansetron (ZOFRAN) injection 4 mg (4 mg Intravenous Given 11/17/18 1529)  sodium chloride 0.9 % bolus 1,000 mL (0 mLs Intravenous Stopped 11/17/18 1640)     Initial Impression / Assessment and Plan / ED Course  I have reviewed the triage vital signs and the nursing notes.  Pertinent labs & imaging results that were available during my care of the patient were reviewed by me and considered in my medical decision making (see chart for details).    Pt will need admission for pyelonephritis. Likely infected stent. Hx of pansensitive staph aureus.  Patient will be given Rocephin now.  Urine  culture now.  Admission the hospital for bicarb of 15 and treatment of pyelonephritis.  I discussed the case with urology, Dr. Junious Silk, who will see the patient in consultation.  He believes the patient may end up benefiting from a nephrostomy tube.  Pt to be admitted to the hospitalist   Final Clinical Impressions(s) / ED Diagnoses   Final diagnoses:  Pyelonephritis    ED Discharge Orders    None  Jola Schmidt, MD 11/17/18 1743

## 2018-11-17 NOTE — ED Notes (Signed)
Admitting MD at bedside.

## 2018-11-17 NOTE — Plan of Care (Signed)
  Problem: Education: Goal: Knowledge of General Education information will improve Description Including pain rating scale, medication(s)/side effects and non-pharmacologic comfort measures Outcome: Progressing   Problem: Clinical Measurements: Goal: Ability to maintain clinical measurements within normal limits will improve Outcome: Progressing Goal: Will remain free from infection Outcome: Progressing   Problem: Activity: Goal: Risk for activity intolerance will decrease Outcome: Progressing   Problem: Nutrition: Goal: Adequate nutrition will be maintained Outcome: Progressing   Problem: Coping: Goal: Level of anxiety will decrease Outcome: Progressing   Problem: Elimination: Goal: Will not experience complications related to bowel motility Outcome: Progressing Goal: Will not experience complications related to urinary retention Outcome: Progressing   Problem: Elimination: Goal: Will not experience complications related to bowel motility Outcome: Progressing Goal: Will not experience complications related to urinary retention Outcome: Progressing   Problem: Pain Managment: Goal: General experience of comfort will improve Outcome: Progressing   Problem: Safety: Goal: Ability to remain free from injury will improve Outcome: Progressing   Problem: Skin Integrity: Goal: Risk for impaired skin integrity will decrease Outcome: Progressing

## 2018-11-17 NOTE — H&P (Addendum)
[facility] Admission History and Physical Service Pager: [phone]  Patient name: Kimberly [name] Medical record number: [id number] Date of birth: [birth year] Age: 57 y.o. Gender: female  Primary Care Provider: Patient, No Pcp Per Consultants: Urology, IR Code Status: Full  Chief Complaint: Dysuria  Assessment and Plan: Kimberly [name] is a 57 y.o. female presenting with dysuria, flank/abdominal pain and chills secondary to pyelonephritis. PMH is significant for Hepatitis C, Bipolar depression, Anxiety, GERD, Ureteral stones, and recurrent UTIs.   Hydro-/Pyelonephritis, chronic, worsening The patient is presenting with 2 days of progressively worsening abdominal and flank pain as well as dysuria.  She also reports nausea and decreased appetite. She received Zofran and Morphine in the ED. In August 2018 she was admitted with a left, 9 mm ureteral stone causing moderate to severe hydronephrosis on CT for which a left ureteral stent was placed.  The patient failed to follow-up outpatient for further management of the stent due to financial concerns. She has a history of recurrent UTIs growing pansensitive staph aureus for which she has been treated with Cipro. CT Abdomen this admission is showing stable severe left hydronephrosis with suspicion for stent occlusion. Urinalysis is showing large leukocytes, many white blood cells, and many bacteria.  Heart rate is borderline tachycardic at 98 but patient remains afebrile. White blood cell count on admission is 16.2, bicarb 15. On physical exam bowel sounds are within normal limits and abdomen is soft and nontender to palpation except in suprapubic region.  Abdominal pain, flank pain and dysuria most consistent with pyelonephritis.  There is low concern for pancreatitis as lipase is within normal limits at 22.  -Admit to MedSurg, attending Dr. Gwendlyn Deutscher -Urology consulted - recommend nephrostomy tube --Continue  Ceftriaxone -Dilaudid 0.5 mg every 3 as needed pain -Zofran PRN nausea -Vitals per floor protocol -Up with assistance -A.m. BMP, CBC -Urine culture pending - follow-up -IR consulted - considering left nephrostomy tube --Consider bladder scan if unable to void  Tobacco Use Disorder: Patient states she smokes about 1 pack a day for the last 26 years. -Nicotine patch 14 mg  Alcohol Use Disorder: The patient states she drinks an 18 pack of beer every week.  She states she has not had beer "in a while" since she has been on antibiotics.  She states she sometimes becomes shaky when she stops drinking beer. - CIWA protocol - Ativan 1 mg for CIWA greater than 8 or withdrawal symptoms - Multivitamin - Thiamine  FEN/GI: Regular diet as tolerated Prophylaxis: Lovenox  Disposition: Admit to med-surg  History of Present Illness:  Kimberly [name] is a 57 y.o. female presenting with 2 days of progressively worsening abdominal pain, flank pain, and dysuria.  She states that she has a lot of pain with urination and is not able to pee very much.  She is also experiencing nausea and chills. She has been taking BC powder to help the pain.  She was most recently on Keflex for a UTI.  She knows she has a left ureteral stent but was unable to follow-up with urology due to financial concerns.  She reports having an episode of bloody urine a couple months ago has not had any recent episodes. In the ED, Patient was given morphine for pain control and IVF. Urine culture was collected and she was started on ceftriaxone given Leukocytosis. CT show a left kidney hydronephrosis with obstructed stent placed in 2018. Urology was consulted and they recommended admission for nephrostomy tube placement.  Review Of Systems: Per HPI with the following additions:  Review of Systems  Constitutional: Positive for chills.  Gastrointestinal: Positive for abdominal pain and nausea. Negative for vomiting.  Genitourinary:  Positive for dysuria, flank pain, frequency and hematuria.   Patient Active Problem List   Diagnosis Date Noted  . Ureteral stone with hydronephrosis 07/22/2017  . Mandibular fracture, closed, initial encounter 03/21/2016  . Alcohol abuse     Past Medical History: Past Medical History:  Diagnosis Date  . Anxiety   . Bipolar 1 disorder ([place])   . Depression   . Hepatitis C    Past Surgical History: Past Surgical History:  Procedure Laterality Date  . ACROMIO-CLAVICULAR JOINT REPAIR Left 02/24/2015   Procedure: LEFT ACROMIO-CLAVICULAR JOINT RECONSTRUCTION;  Surgeon: Leandrew Koyanagi, MD;  Location: [facility];  Service: Orthopedics;  Laterality: Left;  . CYSTOSCOPY W/ URETERAL STENT PLACEMENT Left 07/22/2017   Procedure: CYSTOSCOPY WITH RETROGRADE PYELOGRAM/URETERAL STENT PLACEMENT;  Surgeon: Alexis Frock, MD;  Location: [facility];  Service: Urology;  Laterality: Left;  . ORIF MANDIBULAR FRACTURE N/A 03/22/2016   Procedure: OPEN REDUCTION INTERNAL FIXATION (ORIF) MANDIBULAR FRACTURE;  Surgeon: Melissa Montane, MD;  Location: [facility] OR;  Service: ENT;  Laterality: N/A;    Social History: Social History   Tobacco Use  . Smoking status: Current Every Day Smoker    Types: Cigarettes  . Smokeless tobacco: Never Used  Substance Use Topics  . Alcohol use: Yes  . Drug use: Not Currently    Comment: Pt denies substance use during this TTS assessment on 08-25-14 UDS + for [facility]   Additional social history:  Tobacco: Patient smokes 1 pack/day for 26 years Alcohol: Patient drinks an 18 pack of beer each week Other: Patient reports occasionally smoking marijuana  Please also refer to relevant sections of EMR.  Family History: History reviewed. No pertinent family history. Mother: deceased, cervical cancer No known family history of kidney stones  Allergies and Medications: No Known Allergies No current facility-administered medications on file prior to encounter.    Current Outpatient Medications on  File Prior to Encounter  Medication Sig Dispense Refill  . Aspirin-Caffeine (BC FAST PAIN RELIEF PO) Take 1 packet by mouth daily as needed (for paiin).    . cephALEXin (KEFLEX) 500 MG capsule Take 1 capsule (500 mg total) by mouth 4 (four) times daily. (Patient not taking: Reported on 11/17/2018) 20 capsule 0  . feeding supplement, ENSURE ENLIVE, (ENSURE ENLIVE) LIQD Take 237 mLs by mouth 2 (two) times daily between meals. (Patient not taking: Reported on 07/22/2017) 237 mL 12  . FLUoxetine (PROZAC) 20 MG tablet Take 1 tablet (20 mg total) by mouth daily. (Patient not taking: Reported on 07/22/2017) 30 tablet 0  . folic acid (FOLVITE) 1 MG tablet Take 1 tablet (1 mg total) by mouth daily. (Patient not taking: Reported on 07/22/2017) 30 tablet 0  . methocarbamol (ROBAXIN) 500 MG tablet Take 1 tablet (500 mg total) by mouth every 6 (six) hours as needed for muscle spasms. (Patient not taking: Reported on 07/22/2017) 21 tablet 0  . omeprazole (PRILOSEC) 20 MG capsule Take 1 capsule (20 mg total) by mouth daily. (Patient not taking: Reported on 07/22/2017) 30 capsule 0  . ondansetron (ZOFRAN ODT) 4 MG disintegrating tablet Take 1 tablet (4 mg total) by mouth every 8 (eight) hours as needed for nausea or vomiting. (Patient not taking: Reported on 07/22/2017) 10 tablet 0  . ondansetron (ZOFRAN) 4 MG tablet Take 1 tablet (4 mg total)  by mouth every 6 (six) hours. (Patient not taking: Reported on 07/22/2017) 12 tablet 0  . oxyCODONE (OXY IR/ROXICODONE) 5 MG immediate release tablet Take 1 tablet (5 mg total) by mouth every 4 (four) hours as needed for moderate pain. (Patient not taking: Reported on 11/17/2018) 30 tablet 0  . phenazopyridine (PYRIDIUM) 200 MG tablet Take 1 tablet (200 mg total) by mouth 3 (three) times daily. (Patient not taking: Reported on 11/17/2018) 6 tablet 0  . phenazopyridine (PYRIDIUM) 200 MG tablet Take 1 tablet (200 mg total) by mouth 3 (three) times daily. (Patient not taking: Reported on  11/17/2018) 6 tablet 0  . ranitidine (ZANTAC) 150 MG capsule Take 1 capsule (150 mg total) by mouth daily. (Patient not taking: Reported on 07/22/2017) 30 capsule 0  . risperiDONE (RISPERDAL) 2 MG tablet Take 1 tablet (2 mg total) by mouth daily. (Patient not taking: Reported on 07/22/2017) 30 tablet 0  . tamsulosin (FLOMAX) 0.4 MG CAPS capsule Take 1 capsule (0.4 mg total) by mouth daily after supper. (Patient not taking: Reported on 11/17/2018) 30 capsule 0  . thiamine 100 MG tablet Take 1 tablet (100 mg total) by mouth daily. (Patient not taking: Reported on 08/22/2016) 30 tablet 0   Objective: BP (!) 144/79   Pulse 95   Temp 98 F (36.7 C) (Oral)   Resp 16   LMP 10/09/2012   SpO2 97%   Physical Exam Constitutional:      General: She is in acute distress (2/2 pain).  Cardiovascular:     Rate and Rhythm: Normal rate and regular rhythm.     Pulses: Normal pulses.     Heart sounds: Normal heart sounds.  Pulmonary:     Effort: Pulmonary effort is normal.     Breath sounds: Normal breath sounds.  Abdominal:     General: Bowel sounds are normal. There is no distension.     Tenderness: There is abdominal tenderness (suprapubic region).  Musculoskeletal:        General: No swelling or tenderness.  Neurological:     Mental Status: She is alert.    Labs and Imaging: CBC BMET  Recent Labs  Lab 11/17/18 1507  WBC 16.2*  HGB 14.1  HCT 42.6  PLT 335   Recent Labs  Lab 11/17/18 1507  NA 135  K 3.8  CL 104  CO2 15*  BUN 12  CREATININE 1.01*  GLUCOSE 118*  CALCIUM 9.1     Ct Renal Stone Study  Result Date: 11/17/2018 CLINICAL DATA:  Abdominal and flank pain for 2 days. Nephrolithiasis. EXAM: CT ABDOMEN AND PELVIS WITHOUT CONTRAST TECHNIQUE: Multidetector CT imaging of the abdomen and pelvis was performed following the standard protocol without IV contrast. COMPARISON:  07/22/2017 FINDINGS: Lower chest: No acute findings. Hepatobiliary: No mass visualized on this unenhanced  exam. Gallbladder is unremarkable. Pancreas: No mass or inflammatory process visualized on this unenhanced exam. Spleen:  Within normal limits in size. Adrenals/Urinary tract: Adrenal glands and right kidney are unremarkable. Severe left hydronephrosis shows no significant change. A left ureteral stent is now seen in appropriate position, however there is calcification seen surrounding the proximal and distal pigtail loops, raising suspicion for stent occlusion. Two new calculi are seen in the lower pole collecting system of the left kidney, largest measuring 9 mm. Stomach/Bowel: No evidence of obstruction, inflammatory process, or abnormal fluid collections. Vascular/Lymphatic: No pathologically enlarged lymph nodes identified. No evidence of abdominal aortic aneurysm. Aortic atherosclerosis. Reproductive: Partially calcified posterior uterine fibroid again  seen measuring 3.2 cm. Adnexal regions are unremarkable. Other:  None. Musculoskeletal:  No suspicious bone lesions identified. IMPRESSION: Stable severe left hydronephrosis despite left ureteral stent in appropriate position. Calcification is seen surrounding the proximal and distal pigtail loops, suspicious for stent occlusion. Nonobstructing calculi in left lower pole renal collecting system, largest measuring 9 mm. Stable small calcified uterine fibroid. Electronically Signed   By: Earle Gell M.D.   On: 11/17/2018 16:45    I have seen and evaluated the patient with Dr. Ouida Sills. I am in agreement with the note above in its revised form. My additions are in blue.  Marjie Skiff, MD Family Medicine, PGY-3  Daisy Floro, DO 11/17/2018, 5:46 PM PGY-1, [facility] pager: [phone], text pages welcome

## 2018-11-17 NOTE — ED Triage Notes (Signed)
Pt here  With c/o recurrent uti's , pt has hx of same , state that this one started approx 1 to 2 days ago

## 2018-11-18 ENCOUNTER — Encounter (HOSPITAL_COMMUNITY): Payer: Self-pay | Admitting: Interventional Radiology

## 2018-11-18 ENCOUNTER — Inpatient Hospital Stay (HOSPITAL_COMMUNITY): Payer: Self-pay

## 2018-11-18 DIAGNOSIS — F17200 Nicotine dependence, unspecified, uncomplicated: Secondary | ICD-10-CM

## 2018-11-18 DIAGNOSIS — E876 Hypokalemia: Secondary | ICD-10-CM

## 2018-11-18 HISTORY — PX: IR NEPHROSTOMY PLACEMENT LEFT: IMG6063

## 2018-11-18 LAB — BASIC METABOLIC PANEL
Anion gap: 12 (ref 5–15)
BUN: 7 mg/dL (ref 6–20)
CO2: 18 mmol/L — ABNORMAL LOW (ref 22–32)
Calcium: 8.9 mg/dL (ref 8.9–10.3)
Chloride: 106 mmol/L (ref 98–111)
Creatinine, Ser: 0.89 mg/dL (ref 0.44–1.00)
GFR calc Af Amer: >60 mL/min (ref >60)
GFR calc non Af Amer: >60 mL/min (ref >60)
Glucose, Bld: 96 mg/dL (ref 70–99)
Potassium: 3.3 mmol/L — ABNORMAL LOW (ref 3.5–5.1)
Sodium: 136 mmol/L (ref 135–145)

## 2018-11-18 LAB — SURGICAL PCR SCREEN
MRSA, PCR: NEGATIVE
Staphylococcus aureus: POSITIVE — AB

## 2018-11-18 LAB — CBC
HCT: 37.4 % (ref 36.0–46.0)
Hemoglobin: 12.2 g/dL (ref 12.0–15.0)
MCH: 33.5 pg (ref 26.0–34.0)
MCHC: 32.6 g/dL (ref 30.0–36.0)
MCV: 102.7 fL — ABNORMAL HIGH (ref 80.0–100.0)
Platelets: 284 10*3/uL (ref 150–400)
RBC: 3.64 MIL/uL — ABNORMAL LOW (ref 3.87–5.11)
RDW: 13.7 % (ref 11.5–15.5)
WBC: 13.7 10*3/uL — ABNORMAL HIGH (ref 4.0–10.5)
nRBC: 0 % (ref 0.0–0.2)

## 2018-11-18 MED ORDER — MIDAZOLAM HCL 2 MG/2ML IJ SOLN
INTRAMUSCULAR | Status: AC | PRN
  Administered 2018-11-18: 1 mg via INTRAVENOUS
  Administered 2018-11-18: 0.5 mg via INTRAVENOUS

## 2018-11-18 MED ORDER — IOPAMIDOL (ISOVUE-300) INJECTION 61%
INTRAVENOUS | Status: AC
  Administered 2018-11-18: 5 mL
  Filled 2018-11-18: qty 50

## 2018-11-18 MED ORDER — FENTANYL CITRATE (PF) 100 MCG/2ML IJ SOLN
INTRAMUSCULAR | Status: AC
  Filled 2018-11-18: qty 2

## 2018-11-18 MED ORDER — SODIUM CHLORIDE 0.9 % IV SOLN
INTRAVENOUS | Status: AC | PRN
  Administered 2018-11-18: 10 mL/h via INTRAVENOUS

## 2018-11-18 MED ORDER — FENTANYL CITRATE (PF) 100 MCG/2ML IJ SOLN
INTRAMUSCULAR | Status: AC | PRN
  Administered 2018-11-18 (×2): 25 ug via INTRAVENOUS

## 2018-11-18 MED ORDER — LIDOCAINE HCL 1 % IJ SOLN
INTRAMUSCULAR | Status: AC
  Filled 2018-11-18: qty 20

## 2018-11-18 MED ORDER — ACETAMINOPHEN 650 MG RE SUPP: 650.0000 mg | Freq: Four times a day (QID) | RECTAL | Status: DC | PRN

## 2018-11-18 MED ORDER — MUPIROCIN 2 % EX OINT
1.0000 "application " | TOPICAL_OINTMENT | Freq: Two times a day (BID) | CUTANEOUS | Status: DC
  Administered 2018-11-18 – 2018-11-21 (×7): 1 via NASAL
  Filled 2018-11-18 (×2): qty 22

## 2018-11-18 MED ORDER — LIDOCAINE HCL (PF) 1 % IJ SOLN
INTRAMUSCULAR | Status: AC | PRN
  Administered 2018-11-18: 5 mL

## 2018-11-18 MED ORDER — POTASSIUM CHLORIDE CRYS ER 20 MEQ PO TBCR
40.0000 meq | EXTENDED_RELEASE_TABLET | Freq: Two times a day (BID) | ORAL | Status: AC
  Administered 2018-11-18 (×2): 40 meq via ORAL
  Filled 2018-11-18 (×2): qty 2

## 2018-11-18 MED ORDER — MIDAZOLAM HCL 2 MG/2ML IJ SOLN
INTRAMUSCULAR | Status: AC
  Filled 2018-11-18: qty 2

## 2018-11-18 MED ORDER — HYDROMORPHONE HCL 1 MG/ML IJ SOLN
0.5000 mg | INTRAMUSCULAR | Status: DC | PRN
  Administered 2018-11-18 – 2018-11-19 (×4): 0.5 mg via INTRAVENOUS
  Filled 2018-11-18 (×5): qty 1

## 2018-11-18 MED ORDER — ACETAMINOPHEN 325 MG PO TABS: 650.0000 mg | ORAL_TABLET | Freq: Four times a day (QID) | ORAL | Status: DC | PRN

## 2018-11-18 NOTE — Progress Notes (Signed)
Urology Inpatient Progress Report  Pyelonephritis [N12] Hydronephrosis of left kidney [N13.30]        Intv/Subj: No acute events overnight. Patient is without complaint.  She underwent left nephrostomy tube placement this morning.  She tolerated that well.  She is up in chair eating with no complaints.  Nephrostomy tube is draining light red urine as expected.  I informed the patient that would like for her to follow-up.  She states that "Dr. Tresa Moore is too expensive" and she will not be doing that.  I informed her that we can see if we can work with social work to see about getting her insurance.  Also informed her that another option is to see 1 of our residents which acts as a no charge clinic for patients with no insurance.  She expressed understanding.  Urine culture is currently growing staph aureus.  Active Problems:   Hydronephrosis of left kidney   Pyelonephritis   Tobacco use disorder   Hypokalemia  Current Facility-Administered Medications  Medication Dose Route Frequency Provider Last Rate Last Dose  . acetaminophen (TYLENOL) tablet 650 mg  650 mg Oral Q6H PRN Diallo, Abdoulaye, MD       Or  . acetaminophen (TYLENOL) suppository 650 mg  650 mg Rectal Q6H PRN Diallo, Abdoulaye, MD      . cefTRIAXone (ROCEPHIN) 1 g in sodium chloride 0.9 % 100 mL IVPB  1 g Intravenous Q24H Diallo, Abdoulaye, MD      . folic acid (FOLVITE) tablet 1 mg  1 mg Oral Daily Diallo, Abdoulaye, MD   1 mg at 11/18/18 0956  . HYDROmorphone (DILAUDID) injection 0.5 mg  0.5 mg Intravenous Q3H PRN Diallo, Abdoulaye, MD   0.5 mg at 11/18/18 1212  . LORazepam (ATIVAN) tablet 1 mg  1 mg Oral Q6H PRN Diallo, Abdoulaye, MD       Or  . LORazepam (ATIVAN) injection 1 mg  1 mg Intravenous Q6H PRN Diallo, Abdoulaye, MD      . LORazepam (ATIVAN) tablet 0-4 mg  0-4 mg Oral Q6H Diallo, Abdoulaye, MD       Followed by  . [START ON 11/19/2018] LORazepam (ATIVAN) tablet 0-4 mg  0-4 mg Oral Q12H Diallo, Abdoulaye, MD       . multivitamin with minerals tablet 1 tablet  1 tablet Oral Daily Diallo, Abdoulaye, MD   1 tablet at 11/18/18 0956  . mupirocin ointment (BACTROBAN) 2 % 1 application  1 application Nasal BID Alexis Frock, MD   1 application at 16/10/96 [phone]  . nicotine (NICODERM CQ - dosed in mg/24 hours) patch 14 mg  14 mg Transdermal Daily Diallo, Abdoulaye, MD   14 mg at 11/18/18 0957  . ondansetron (ZOFRAN) tablet 4 mg  4 mg Oral Q6H PRN Diallo, Abdoulaye, MD       Or  . ondansetron (ZOFRAN) injection 4 mg  4 mg Intravenous Q6H PRN Diallo, Abdoulaye, MD      . potassium chloride SA (K-DUR,KLOR-CON) CR tablet 40 mEq  40 mEq Oral BID Milus Banister C, DO   40 mEq at 11/18/18 0956  . thiamine (VITAMIN B-1) tablet 100 mg  100 mg Oral Daily Diallo, Abdoulaye, MD   100 mg at 11/18/18 0956   Or  . thiamine (B-1) injection 100 mg  100 mg Intravenous Daily Diallo, Abdoulaye, MD         Objective: Vital: Vitals:   11/18/18 1202 11/18/18 1230 11/18/18 1305 11/18/18 1329  BP: 128/84 (!) 145/86 (!) 129/99  130/74  Pulse: 94 87 (!) 101 90  Resp: 18 17 18 18   Temp: 98.3 F (36.8 C) 98.2 F (36.8 C) 98.9 F (37.2 C) 98.6 F (37 C)  TempSrc: Oral Oral Oral Oral  SpO2: 94% 100% 97% 95%  Weight:      Height:       I/Os: I/O last 3 completed shifts: In: 1360 [P.O.:360; IV Piggyback:1000] Out: 450 [Urine:450]  Physical Exam:  General: Patient is in no apparent distress Lungs: Normal respiratory effort, chest expands symmetrically. GI: The abdomen is soft and nontender without mass. Left-sided nephrostomy tube draining light red urine Ext: lower extremities symmetric  Lab Results: Recent Labs    11/17/18 1507 11/18/18 0638  WBC 16.2* 13.7*  HGB 14.1 12.2  HCT 42.6 37.4   Recent Labs    11/17/18 1507 11/18/18 0638  NA 135 136  K 3.8 3.3*  CL 104 106  CO2 15* 18*  GLUCOSE 118* 96  BUN 12 7  CREATININE 1.01* 0.89  CALCIUM 9.1 8.9   No results for input(s): LABPT, INR in the last 72  hours. No results for input(s): LABURIN in the last 72 hours. Results for orders placed or performed during the hospital encounter of 11/17/18  Urine culture     Status: Abnormal (Preliminary result)   Collection Time: 11/17/18  4:32 PM  Result Value Ref Range Status   Specimen Description URINE, RANDOM  Final   Special Requests   Final    NONE Performed at [facility], [place] [address], [place], [place] [address]    Culture >=100,000 COLONIES/mL STAPHYLOCOCCUS AUREUS (A)  Final   Report Status PENDING  Incomplete  Surgical PCR screen     Status: Abnormal   Collection Time: 11/18/18  1:33 AM  Result Value Ref Range Status   MRSA, PCR NEGATIVE NEGATIVE Final   Staphylococcus aureus POSITIVE (A) NEGATIVE Final    Comment: (NOTE) The Xpert SA Assay (FDA approved for NASAL specimens in patients 32 years of age and older), is one component of a comprehensive surveillance program. It is not intended to diagnose infection nor to guide or monitor treatment. Performed at [facility], [place] [address] [place], [place] [address]     Studies/Results: Ct Renal Stone Study  Result Date: 11/17/2018 CLINICAL DATA:  Abdominal and flank pain for 2 days. Nephrolithiasis. EXAM: CT ABDOMEN AND PELVIS WITHOUT CONTRAST TECHNIQUE: Multidetector CT imaging of the abdomen and pelvis was performed following the standard protocol without IV contrast. COMPARISON:  07/22/2017 FINDINGS: Lower chest: No acute findings. Hepatobiliary: No mass visualized on this unenhanced exam. Gallbladder is unremarkable. Pancreas: No mass or inflammatory process visualized on this unenhanced exam. Spleen:  Within normal limits in size. Adrenals/Urinary tract: Adrenal glands and right kidney are unremarkable. Severe left hydronephrosis shows no significant change. A left ureteral stent is now seen in appropriate position, however there is calcification seen surrounding the proximal and distal pigtail loops, raising  suspicion for stent occlusion. Two new calculi are seen in the lower pole collecting system of the left kidney, largest measuring 9 mm. Stomach/Bowel: No evidence of obstruction, inflammatory process, or abnormal fluid collections. Vascular/Lymphatic: No pathologically enlarged lymph nodes identified. No evidence of abdominal aortic aneurysm. Aortic atherosclerosis. Reproductive: Partially calcified posterior uterine fibroid again seen measuring 3.2 cm. Adnexal regions are unremarkable. Other:  None. Musculoskeletal:  No suspicious bone lesions identified. IMPRESSION: Stable severe left hydronephrosis despite left ureteral stent in appropriate position. Calcification is seen surrounding  the proximal and distal pigtail loops, suspicious for stent occlusion. Nonobstructing calculi in left lower pole renal collecting system, largest measuring 9 mm. Stable small calcified uterine fibroid. Electronically Signed   By: Earle Gell M.D.   On: 11/17/2018 16:45   Ir Nephrostomy Placement Left  Result Date: 11/18/2018 INDICATION: Obstructing left UPJ calculus and occluded stent.  Hydronephrosis. EXAM: ULTRASOUND FLUOROSCOPIC LEFT NEPHROSTOMY INSERTION COMPARISON:  [facility] 11/17/2018 MEDICATIONS: Patient is on Rocephin as an inpatient. ANESTHESIA/SEDATION: Fentanyl 50 mcg IV; Versed 1.5 mg IV Moderate Sedation Time:  10 minutes The patient was continuously monitored during the procedure by the interventional radiology nurse under my direct supervision. CONTRAST:  5 cc Isovue-administered into the collecting system(s) FLUOROSCOPY TIME:  Fluoroscopy Time: 0 minutes 18 seconds (1.0 mGy). COMPLICATIONS: None immediate. PROCEDURE: Informed written consent was obtained from the patient after a thorough discussion of the procedural risks, benefits and alternatives. All questions were addressed. Maximal Sterile Barrier Technique was utilized including caps, mask, sterile gowns, sterile gloves, sterile drape, hand hygiene and skin  antiseptic. A timeout was performed prior to the initiation of the procedure. Previous imaging reviewed. Patient position prone. Ultrasound localization performed demonstrating the hydronephrotic left kidney. Overlying skin marked. Under sterile conditions and local anesthesia, ultrasound guidance was utilized to access the severely hydronephrotic left kidney through a midpole calyx. Needle position confirmed with ultrasound. Images obtained for documentation. Guidewire inserted followed by tract dilatation to insert a 10 French nephrostomy. Retention loop formed in the renal pelvis. Position confirmed with fluoroscopy. Images obtained for documentation. There was return of exudative urine. Catheter secured with Prolene suture and connected to external gravity drainage bag. No immediate complication. Patient tolerated the procedure well. IMPRESSION: Successful ultrasound and fluoroscopic 10 French left nephrostomy Electronically Signed   By: Jerilynn Mages.  Shick M.D.   On: 11/18/2018 12:07    Assessment: Pyelonephritis secondary to urinary tract infection Retained left ureteral stent with resultant left hydronephrosis  Plan: Patient doing well status post nephrostomy tube placement.  Hopefully can transition her to oral antibiotics once sensitivities return.  I would recommend social work consult to see if she qualifies for Medicaid and get her set up for that so that she can receive appropriate timely care.  She is to follow-up outpatient.   Link Snuffer, MD Urology 11/18/2018, 1:49 PM

## 2018-11-18 NOTE — Consult Note (Signed)
Chief Complaint: Patient was seen in consultation today for left hydronephrosis.  Referring Physician(s): Jola Schmidt  Supervising Physician: Daryll Brod  Patient Status: [facility] - In-pt  History of Present Illness: Kimberly [name] is a 57 y.o. female with a past medical history of hepatitis C, nephrolithiasis s/p left ureteral stent placement 2018 (of note, never followed-up to have stones removed), bipolar 1 disorder, anxiety, and depression. She presented to [facility] ED 11/17/2018 with complaints of recurrent dysuria, urinary frequency, and N/V. She was found to have left hydronephrosis/pyelonephrosis and was admitted for management.  CT renal stone study 11/17/2018: 1. Stable severe left hydronephrosis despite left ureteral stent in appropriate position. Calcification is seen surrounding the proximal and distal pigtail loops, suspicious for stent occlusion. 2. Nonobstructing calculi in left lower pole renal collecting system, largest measuring 9 mm. 3. Stable small calcified uterine fibroid.  IR requested by Dr. Venora Maples for possible image-guided percutaneous left nephrostomy tube placement. Patient awake and alert laying in bed. Complains of left flank/back pain. States it has improved as she just received pain medications. Denies fever, chills, chest pain, dyspnea, abdominal pain, or headache.   Past Medical History:  Diagnosis Date  . Anxiety   . Bipolar 1 disorder ([place])   . Depression   . Hepatitis C     Past Surgical History:  Procedure Laterality Date  . ACROMIO-CLAVICULAR JOINT REPAIR Left 02/24/2015   Procedure: LEFT ACROMIO-CLAVICULAR JOINT RECONSTRUCTION;  Surgeon: Leandrew Koyanagi, MD;  Location: [facility];  Service: Orthopedics;  Laterality: Left;  . CYSTOSCOPY W/ URETERAL STENT PLACEMENT Left 07/22/2017   Procedure: CYSTOSCOPY WITH RETROGRADE PYELOGRAM/URETERAL STENT PLACEMENT;  Surgeon: Alexis Frock, MD;  Location: [facility];  Service: Urology;  Laterality: Left;  . ORIF  MANDIBULAR FRACTURE N/A 03/22/2016   Procedure: OPEN REDUCTION INTERNAL FIXATION (ORIF) MANDIBULAR FRACTURE;  Surgeon: Melissa Montane, MD;  Location: [facility];  Service: ENT;  Laterality: N/A;    Allergies: Patient has no known allergies.  Medications: Prior to Admission medications   Medication Sig Start Date End Date Taking? Authorizing Provider  Aspirin-Caffeine (BC FAST PAIN RELIEF PO) Take 1 packet by mouth daily as needed (for paiin).   Yes [provider]  cephALEXin (KEFLEX) 500 MG capsule Take 1 capsule (500 mg total) by mouth 4 (four) times daily. Patient not taking: Reported on 11/17/2018 10/26/18   Lajean Saver, MD  feeding supplement, ENSURE ENLIVE, (ENSURE ENLIVE) LIQD Take 237 mLs by mouth 2 (two) times daily between meals. Patient not taking: Reported on 07/22/2017 03/26/16   Lavina Hamman, MD  FLUoxetine (PROZAC) 20 MG tablet Take 1 tablet (20 mg total) by mouth daily. Patient not taking: Reported on 07/22/2017 08/25/14   Carmin Muskrat, MD  folic acid (FOLVITE) 1 MG tablet Take 1 tablet (1 mg total) by mouth daily. Patient not taking: Reported on 07/22/2017 03/26/16   Lavina Hamman, MD  methocarbamol (ROBAXIN) 500 MG tablet Take 1 tablet (500 mg total) by mouth every 6 (six) hours as needed for muscle spasms. Patient not taking: Reported on 07/22/2017 03/26/16   Lavina Hamman, MD  omeprazole (PRILOSEC) 20 MG capsule Take 1 capsule (20 mg total) by mouth daily. Patient not taking: Reported on 07/22/2017 08/22/16   Waynetta Pean, PA-C  ondansetron (ZOFRAN ODT) 4 MG disintegrating tablet Take 1 tablet (4 mg total) by mouth every 8 (eight) hours as needed for nausea or vomiting. Patient not taking: Reported on 07/22/2017 08/22/16   Waynetta Pean, PA-C  ondansetron [facility]) 4  MG tablet Take 1 tablet (4 mg total) by mouth every 6 (six) hours. Patient not taking: Reported on 07/22/2017 04/27/16   Mesner, Corene Cornea, MD  oxyCODONE (OXY IR/ROXICODONE) 5 MG immediate release tablet Take 1  tablet (5 mg total) by mouth every 4 (four) hours as needed for moderate pain. Patient not taking: Reported on 11/17/2018 07/27/17   Cleon Gustin, MD  phenazopyridine (PYRIDIUM) 200 MG tablet Take 1 tablet (200 mg total) by mouth 3 (three) times daily. Patient not taking: Reported on 11/17/2018 09/28/18   Loura Halt A, NP  phenazopyridine (PYRIDIUM) 200 MG tablet Take 1 tablet (200 mg total) by mouth 3 (three) times daily. Patient not taking: Reported on 11/17/2018 10/26/18   Lajean Saver, MD  ranitidine (ZANTAC) 150 MG capsule Take 1 capsule (150 mg total) by mouth daily. Patient not taking: Reported on 07/22/2017 08/22/16   Waynetta Pean, PA-C  risperiDONE (RISPERDAL) 2 MG tablet Take 1 tablet (2 mg total) by mouth daily. Patient not taking: Reported on 07/22/2017 08/25/14   Carmin Muskrat, MD  tamsulosin (FLOMAX) 0.4 MG CAPS capsule Take 1 capsule (0.4 mg total) by mouth daily after supper. Patient not taking: Reported on 11/17/2018 02/16/18   Katheren Shams, DO  thiamine 100 MG tablet Take 1 tablet (100 mg total) by mouth daily. Patient not taking: Reported on 08/22/2016 03/26/16   Lavina Hamman, MD     History reviewed. No pertinent family history.  Social History   Socioeconomic History  . Marital status: Single    Spouse name: Not on file  . Number of children: Not on file  . Years of education: Not on file  . Highest education level: Not on file  Occupational History  . Not on file  Social Needs  . Financial resource strain: Not on file  . Food insecurity:    Worry: Not on file    Inability: Not on file  . Transportation needs:    Medical: Not on file    Non-medical: Not on file  Tobacco Use  . Smoking status: Current Every Day Smoker    Types: Cigarettes  . Smokeless tobacco: Never Used  Substance and Sexual Activity  . Alcohol use: Yes  . Drug use: Not Currently    Comment: Pt denies substance use during this TTS assessment on 08-25-14 UDS + for THC  . Sexual  activity: Not on file  Lifestyle  . Physical activity:    Days per week: Not on file    Minutes per session: Not on file  . Stress: Not on file  Relationships  . Social connections:    Talks on phone: Not on file    Gets together: Not on file    Attends religious service: Not on file    Active member of club or organization: Not on file    Attends meetings of clubs or organizations: Not on file    Relationship status: Not on file  Other Topics Concern  . Not on file  Social History Narrative  . Not on file     Review of Systems: A 12 point ROS discussed and pertinent positives are indicated in the HPI above.  All other systems are negative.  Review of Systems  Constitutional: Negative for chills and fever.  Respiratory: Negative for shortness of breath and wheezing.   Cardiovascular: Negative for chest pain and palpitations.  Gastrointestinal: Negative for abdominal pain.  Genitourinary: Positive for flank pain.  Musculoskeletal: Positive for back pain.  Neurological:  Negative for headaches.  Psychiatric/Behavioral: Negative for behavioral problems and confusion.    Vital Signs: BP 133/79 (BP Location: Left Arm)   Pulse 88   Temp 99.1 F (37.3 C) (Oral)   Resp 18   LMP 10/09/2012   SpO2 96%   Physical Exam Constitutional:      General: She is not in acute distress.    Appearance: Normal appearance.  Cardiovascular:     Rate and Rhythm: Normal rate and regular rhythm.     Heart sounds: Normal heart sounds. No murmur.  Pulmonary:     Effort: Pulmonary effort is normal. No respiratory distress.     Breath sounds: Normal breath sounds. No wheezing.  Abdominal:     Palpations: Abdomen is soft.  Skin:    General: Skin is warm and dry.  Neurological:     Mental Status: She is alert and oriented to person, place, and time.  Psychiatric:        Mood and Affect: Mood normal.        Behavior: Behavior normal.        Thought Content: Thought content normal.         Judgment: Judgment normal.      MD Evaluation Airway: WNL Heart: WNL Abdomen: WNL Chest/ Lungs: WNL ASA  Classification: 3 Mallampati/Airway Score: Two   Imaging: Ct Renal Stone Study  Result Date: 11/17/2018 CLINICAL DATA:  Abdominal and flank pain for 2 days. Nephrolithiasis. EXAM: CT ABDOMEN AND PELVIS WITHOUT CONTRAST TECHNIQUE: Multidetector CT imaging of the abdomen and pelvis was performed following the standard protocol without IV contrast. COMPARISON:  07/22/2017 FINDINGS: Lower chest: No acute findings. Hepatobiliary: No mass visualized on this unenhanced exam. Gallbladder is unremarkable. Pancreas: No mass or inflammatory process visualized on this unenhanced exam. Spleen:  Within normal limits in size. Adrenals/Urinary tract: Adrenal glands and right kidney are unremarkable. Severe left hydronephrosis shows no significant change. A left ureteral stent is now seen in appropriate position, however there is calcification seen surrounding the proximal and distal pigtail loops, raising suspicion for stent occlusion. Two new calculi are seen in the lower pole collecting system of the left kidney, largest measuring 9 mm. Stomach/Bowel: No evidence of obstruction, inflammatory process, or abnormal fluid collections. Vascular/Lymphatic: No pathologically enlarged lymph nodes identified. No evidence of abdominal aortic aneurysm. Aortic atherosclerosis. Reproductive: Partially calcified posterior uterine fibroid again seen measuring 3.2 cm. Adnexal regions are unremarkable. Other:  None. Musculoskeletal:  No suspicious bone lesions identified. IMPRESSION: Stable severe left hydronephrosis despite left ureteral stent in appropriate position. Calcification is seen surrounding the proximal and distal pigtail loops, suspicious for stent occlusion. Nonobstructing calculi in left lower pole renal collecting system, largest measuring 9 mm. Stable small calcified uterine fibroid. Electronically Signed    By: Earle Gell M.D.   On: 11/17/2018 16:45    Labs:  CBC: Recent Labs    11/17/18 1507 11/18/18 0638  WBC 16.2* 13.7*  HGB 14.1 12.2  HCT 42.6 37.4  PLT 335 284    COAGS: No results for input(s): INR, APTT in the last 8760 hours.  BMP: Recent Labs    11/17/18 1507 11/18/18 0638  NA 135 136  K 3.8 3.3*  CL 104 106  CO2 15* 18*  GLUCOSE 118* 96  BUN 12 7  CALCIUM 9.1 8.9  CREATININE 1.01* 0.89  GFRNONAA >60 >60  GFRAA >60 >60    LIVER FUNCTION TESTS: Recent Labs    11/17/18 1507  BILITOT 0.4  AST  30  ALT 26  ALKPHOS 83  PROT 8.4*  ALBUMIN 3.6    TUMOR MARKERS: No results for input(s): AFPTM, CEA, CA199, CHROMGRNA in the last 8760 hours.  Assessment and Plan:  Left hydronephrosis. Plan for image-guided percutaneous left nephrostomy tube placement today with Dr. Annamaria Boots. Patient is NPO. Afebrile. She does not take blood thinners.  Risks and benefits discussed with the patient including bleeding, infection, damage to adjacent structures, bowel perforation/fistula connection, and sepsis. All of the patient's questions were answered, patient is agreeable to proceed. Consent signed and in chart.   Thank you for this interesting consult.  I greatly enjoyed meeting Kimberly [name] and look forward to participating in their care.  A copy of this report was sent to the requesting provider on this date.  Electronically Signed: Earley Abide, PA-C 11/18/2018, 9:19 AM   I spent a total of 40 Minutes in face to face in clinical consultation, greater than 50% of which was counseling/coordinating care for left hydronephrosis.

## 2018-11-18 NOTE — Progress Notes (Signed)
Family Medicine Teaching Service Daily Progress Note Intern Pager: [phone]  Patient name: Kimberly [name] Medical record number: [id number] Date of birth: 1961/01/18 Age: 57 y.o. Gender: female  Primary Care Provider: Patient, No Pcp Per Consultants: Urology, IR Code Status: Full  Pt Overview and Major Events to Date:  Admitted - 11/17/2018  Assessment and Plan:  Kimberly [name] is a 57 y.o. female presenting with dysuria, flank/abdominal pain and chills secondary to pyelonephritis. PMH is significant for Hepatitis C, Bipolar depression, Anxiety, GERD, Ureteral stones, and recurrent UTIs.   Hydro-/Pyelonephritis, chronic, stable: WBC count improved from 16.2 to 13.7. Received 4 doses of 4mg  morphine overnight for pain control, as well as 0.5 mg dilaudid this AM 12/25. Urine culture growing 100K colonies of unidentified organism. -Urology consulted - recommend nephrostomy tube -Continue Ceftriaxone -Dilaudid 0.5 mg every 3 as needed pain -Zofran PRN nausea -Vitals per floor protocol -Up with assistance -A.m. BMP, CBC -Urine culture - growing 100K colonies of unidentified organism, final report pending -IR consulted - left nephrostomy tube  -Consider bladder scan if unable to void  Hypokalemia: Potassium 3.3 on 11/18/2018.  Was normal at 3.8 on admission. -Replete with K-Dur 40 mEq x 2  Tobacco Use Disorder: Patient states she smokes about 1 pack a day for the last 26 years. -Nicotine patch 14 mg  Alcohol Use Disorder: CIWA scores overnight 0. The patient states she drinks an 18 pack of beer every week.  She states she has not had beer "in a while" since she has been on antibiotics.  She states she sometimes becomes shaky when she stops drinking beer. - CIWA protocol - Ativan 1 mg for CIWA greater than 8 or withdrawal symptoms - Multivitamin - Thiamine  FEN/GI: Regular diet as tolerated Prophylaxis: Lovenox  Disposition: Discharge home once medically  stable  Subjective:  Patient seen this afternoon after nephrostomy tube placement.  States she is doing much better.  Other than dysuria she denies having any symptoms or issues at this time.  Objective: Temp:  [98 F (36.7 C)-99.3 F (37.4 C)] 99.1 F (37.3 C) (12/25 0800) Pulse Rate:  [88-116] 88 (12/25 0800) Resp:  [16-18] 18 (12/25 0800) BP: (129-156)/(68-104) 133/79 (12/25 0800) SpO2:  [92 %-99 %] 96 % (12/25 0800)  Physical Exam Cardiovascular:     Rate and Rhythm: Normal rate and regular rhythm.     Pulses: Normal pulses.     Heart sounds: Normal heart sounds.  Pulmonary:     Effort: Pulmonary effort is normal.     Breath sounds: Normal breath sounds.  Abdominal:     General: Bowel sounds are normal.     Comments: Left nephrostomy tube in place draining serosanguineous fluid, dressing in place  Neurological:     Mental Status: She is alert.   Laboratory: Recent Labs  Lab 11/17/18 1507 11/18/18 0638  WBC 16.2* 13.7*  HGB 14.1 12.2  HCT 42.6 37.4  PLT 335 284   Recent Labs  Lab 11/17/18 1507 11/18/18 0638  NA 135 136  K 3.8 3.3*  CL 104 106  CO2 15* 18*  BUN 12 7  CREATININE 1.01* 0.89  CALCIUM 9.1 8.9  PROT 8.4*  --   BILITOT 0.4  --   ALKPHOS 83  --   ALT 26  --   AST 30  --   GLUCOSE 118* 96   Urinalysis    Component Value Date/Time   COLORURINE AMBER (A) 11/17/2018 1632   APPEARANCEUR TURBID (A) 11/17/2018 1632  LABSPEC 1.023 11/17/2018 1632   PHURINE 5.0 11/17/2018 1632   GLUCOSEU NEGATIVE 11/17/2018 1632   HGBUR MODERATE (A) 11/17/2018 1632   BILIRUBINUR NEGATIVE 11/17/2018 1632   KETONESUR 20 (A) 11/17/2018 1632   PROTEINUR 100 (A) 11/17/2018 1632   UROBILINOGEN 0.2 09/07/2018 1909   NITRITE NEGATIVE 11/17/2018 1632   LEUKOCYTESUR LARGE (A) 11/17/2018 1632   Lipase: 22 Urine culture: pending  Imaging/Diagnostic Tests: Ct Renal Stone Study  Result Date: 11/17/2018 CLINICAL DATA:  Abdominal and flank pain for 2 days.  Nephrolithiasis. EXAM: CT ABDOMEN AND PELVIS WITHOUT CONTRAST TECHNIQUE: Multidetector CT imaging of the abdomen and pelvis was performed following the standard protocol without IV contrast. COMPARISON:  07/22/2017 FINDINGS: Lower chest: No acute findings. Hepatobiliary: No mass visualized on this unenhanced exam. Gallbladder is unremarkable. Pancreas: No mass or inflammatory process visualized on this unenhanced exam. Spleen:  Within normal limits in size. Adrenals/Urinary tract: Adrenal glands and right kidney are unremarkable. Severe left hydronephrosis shows no significant change. A left ureteral stent is now seen in appropriate position, however there is calcification seen surrounding the proximal and distal pigtail loops, raising suspicion for stent occlusion. Two new calculi are seen in the lower pole collecting system of the left kidney, largest measuring 9 mm. Stomach/Bowel: No evidence of obstruction, inflammatory process, or abnormal fluid collections. Vascular/Lymphatic: No pathologically enlarged lymph nodes identified. No evidence of abdominal aortic aneurysm. Aortic atherosclerosis. Reproductive: Partially calcified posterior uterine fibroid again seen measuring 3.2 cm. Adnexal regions are unremarkable. Other:  None. Musculoskeletal:  No suspicious bone lesions identified. IMPRESSION: Stable severe left hydronephrosis despite left ureteral stent in appropriate position. Calcification is seen surrounding the proximal and distal pigtail loops, suspicious for stent occlusion. Nonobstructing calculi in left lower pole renal collecting system, largest measuring 9 mm. Stable small calcified uterine fibroid. Electronically Signed   By: Earle Gell M.D.   On: 11/17/2018 16:45    Daisy Floro, DO 11/18/2018, 8:27 AM PGY-1, [place] Intern pager: [phone], text pages welcome

## 2018-11-18 NOTE — Procedures (Signed)
Left hydro with stone disease and occluded stent  S/p left pcn insertion  No comp Stable ebl min To gravity drain Full report in pacs

## 2018-11-18 NOTE — Discharge Summary (Signed)
[facility] Discharge Summary  Patient name: Kimberly [name] Medical record number: [id number] Date of birth: 1961/07/20 Age: 57 y.o. Gender: female Date of Admission: 11/17/2018  Date of Discharge: 11/22/2019 Admitting Physician: Kinnie Feil, MD  Primary Care Provider: Patient, No Pcp Per Consultants: Urology, IR  Indication for Hospitalization: Pyelonephritis  Discharge Diagnoses/Problem List:  Renal stones Pyelonephritis Recurrent UTIs GERD Anxiety Bipolar depression Hepatitis C  Disposition: Home  Discharge Condition: Stable  Discharge Exam:  Temp:  [97.8 F (36.6 C)-98.2 F (36.8 C)] 97.8 F (36.6 C) (12/28 0900) Pulse Rate:  [68-75] 70 (12/28 0900) Resp:  [18] 18 (12/28 0900) BP: (138-156)/(71-82) 150/82 (12/28 0900) SpO2:  [98 %-99 %] 99 % (12/28 0900) Weight:  [61.1 kg] 61.1 kg (12/27 1900) Physical Exam:  General: Alert, conversant Cardiac: RRR, no M/R/G Lungs: CTA bil, no W/R?R Abdomen: soft, nontender, non-distended, normoactive BS, nephrostomy wound with bandage c/d/i Msk: Moves all extremities spontaneously  Ext: Warm, dry, 2+ distal pulses, no edema   Brief Hospital Course:  Kimberly [name] is a 57 year old female who presented to the emergency department with abdominal pain, flank pain, and dysuria found to have pyelonephritis.  She was also found to have severe left hydronephrosis with calcified ureteral stent and multiple calculi.  She was started on IV ceftriaxone and urology was consulted.  IR performed a left nephrostomy on 11/18/2018.  Urine culture grew staph aureus. Urology recommended transition to oral antibiotics and follow up outpatient. Social work was consulted for assistance in setting up insurance. Urology to manage drain out patient.  Issues for Follow Up:  1. Patient needs to follow-up with urology can work with social work to [employer]. She can also see the urology resident which acts as a no  charge clinic for patients without insurance. 2. Last day of Keflex 11/26/2018 3. Patient DC'd with short course of naproxen, tylenol, tramadol 4. Dressing changes/tube draining teaching completed prior to DC  Significant Procedures:  11/18/2018 - left nephrostomy  Significant Labs and Imaging:  Recent Labs  Lab 11/19/18 0529 11/20/18 0423 11/21/18 0430  WBC 12.3* 9.0 7.6  HGB 12.9 12.1 12.4  HCT 39.7 37.0 38.0  PLT 333 328 350   Recent Labs  Lab 11/17/18 1507 11/18/18 0638 11/19/18 0529 11/20/18 0423 11/21/18 0430  NA 135 136 133* 135 136  K 3.8 3.3* 4.2 4.4 4.0  CL 104 106 101 102 100  CO2 15* 18* 22 23 26   GLUCOSE 118* 96 110* 100* 87  BUN 12 7 8 12 8   CREATININE 1.01* 0.89 0.87 0.80 0.71  CALCIUM 9.1 8.9 9.1 8.9 8.9  ALKPHOS 83  --   --   --   --   AST 30  --   --   --   --   ALT 26  --   --   --   --   ALBUMIN 3.6  --   --   --   --    Urinalysis    Component Value Date/Time   COLORURINE AMBER (A) 11/17/2018 1632   APPEARANCEUR TURBID (A) 11/17/2018 1632   LABSPEC 1.023 11/17/2018 1632   PHURINE 5.0 11/17/2018 1632   GLUCOSEU NEGATIVE 11/17/2018 1632   HGBUR MODERATE (A) 11/17/2018 1632   BILIRUBINUR NEGATIVE 11/17/2018 1632   KETONESUR 20 (A) 11/17/2018 1632   PROTEINUR 100 (A) 11/17/2018 1632   UROBILINOGEN 0.2 09/07/2018 1909   NITRITE NEGATIVE 11/17/2018 1632   LEUKOCYTESUR LARGE (A) 11/17/2018 1632  Urine culture: Growing staph aureus  Results/Tests Pending at Time of Discharge: None  Discharge Medications:  Allergies as of 11/21/2018   No Known Allergies     Medication List    STOP taking these medications   BC FAST PAIN RELIEF PO   FLUoxetine 20 MG tablet Commonly known as:  PROZAC   methocarbamol 500 MG tablet Commonly known as:  ROBAXIN   omeprazole 20 MG capsule Commonly known as:  PRILOSEC   ondansetron 4 MG disintegrating tablet Commonly known as:  ZOFRAN ODT   ondansetron 4 MG tablet Commonly known as:  ZOFRAN    oxyCODONE 5 MG immediate release tablet Commonly known as:  Oxy IR/ROXICODONE   phenazopyridine 200 MG tablet Commonly known as:  PYRIDIUM   ranitidine 150 MG capsule Commonly known as:  ZANTAC   risperiDONE 2 MG tablet Commonly known as:  RISPERDAL   tamsulosin 0.4 MG Caps capsule Commonly known as:  FLOMAX     TAKE these medications   acetaminophen 500 MG tablet Commonly known as:  TYLENOL Take 2 tablets (1,000 mg total) by mouth 3 (three) times daily for 7 days.   cephALEXin 500 MG capsule Commonly known as:  KEFLEX Take 1 capsule (500 mg total) by mouth 4 (four) times daily for 22 doses.   feeding supplement (ENSURE ENLIVE) Liqd Take 237 mLs by mouth 2 (two) times daily between meals.   folic acid 1 MG tablet Commonly known as:  FOLVITE Take 1 tablet (1 mg total) by mouth daily.   naproxen 500 MG tablet Commonly known as:  NAPROSYN Take 1 tablet (500 mg total) by mouth 2 (two) times daily as needed for mild pain or moderate pain.   thiamine 100 MG tablet Take 1 tablet (100 mg total) by mouth daily.   traMADol 50 MG tablet Commonly known as:  ULTRAM Take 1 tablet (50 mg total) by mouth every 6 (six) hours as needed for moderate pain or severe pain (breakthrough pain).       Discharge Instructions: Please refer to Patient Instructions section of EMR for full details.  Patient was counseled important signs and symptoms that should prompt return to medical care, changes in medications, dietary instructions, activity restrictions, and follow up appointments.   Follow-Up Appointments: Follow-up Information    Lucas Mallow, MD. Schedule an appointment as soon as possible for a visit.   Specialty:  Urology Why:  Please make an appointment to be seen in the next week so that they can monitor your tube.  When you call to schedule your appointment, please make sure you let them know you need to see a urology resident for cost effectiveness. Contact  information: [place] [phone] [place] Follow up.   Why:  follow up appointment December 08, 2018 at 1000 am  Contact information: [address] [place] [address] [phone]          Everrett Coombe, MD 11/21/2018, 5:04 PM PGY-3, [place]

## 2018-11-19 DIAGNOSIS — F1010 Alcohol abuse, uncomplicated: Secondary | ICD-10-CM

## 2018-11-19 DIAGNOSIS — E876 Hypokalemia: Secondary | ICD-10-CM

## 2018-11-19 DIAGNOSIS — N1330 Unspecified hydronephrosis: Secondary | ICD-10-CM

## 2018-11-19 DIAGNOSIS — F17200 Nicotine dependence, unspecified, uncomplicated: Secondary | ICD-10-CM

## 2018-11-19 DIAGNOSIS — N12 Tubulo-interstitial nephritis, not specified as acute or chronic: Secondary | ICD-10-CM

## 2018-11-19 LAB — BASIC METABOLIC PANEL
Anion gap: 10 (ref 5–15)
BUN: 8 mg/dL (ref 6–20)
CO2: 22 mmol/L (ref 22–32)
Calcium: 9.1 mg/dL (ref 8.9–10.3)
Chloride: 101 mmol/L (ref 98–111)
Creatinine, Ser: 0.87 mg/dL (ref 0.44–1.00)
GFR calc Af Amer: >60 mL/min (ref >60)
GFR calc non Af Amer: >60 mL/min (ref >60)
Glucose, Bld: 110 mg/dL — ABNORMAL HIGH (ref 70–99)
Potassium: 4.2 mmol/L (ref 3.5–5.1)
Sodium: 133 mmol/L — ABNORMAL LOW (ref 135–145)

## 2018-11-19 LAB — CBC
HCT: 39.7 % (ref 36.0–46.0)
Hemoglobin: 12.9 g/dL (ref 12.0–15.0)
MCH: 33.2 pg (ref 26.0–34.0)
MCHC: 32.5 g/dL (ref 30.0–36.0)
MCV: 102.3 fL — ABNORMAL HIGH (ref 80.0–100.0)
Platelets: 333 10*3/uL (ref 150–400)
RBC: 3.88 MIL/uL (ref 3.87–5.11)
RDW: 13.8 % (ref 11.5–15.5)
WBC: 12.3 10*3/uL — ABNORMAL HIGH (ref 4.0–10.5)
nRBC: 0 % (ref 0.0–0.2)

## 2018-11-19 LAB — HIV ANTIBODY (ROUTINE TESTING W REFLEX): HIV Screen 4th Generation wRfx: NONREACTIVE

## 2018-11-19 LAB — URINE CULTURE: Culture: >=100000 — AB

## 2018-11-19 MED ORDER — NAPROXEN 250 MG PO TABS
500.0000 mg | ORAL_TABLET | Freq: Once | ORAL | Status: AC
  Administered 2018-11-19: 500 mg via ORAL
  Filled 2018-11-19: qty 2

## 2018-11-19 MED ORDER — ENOXAPARIN SODIUM 40 MG/0.4ML ~~LOC~~ SOLN
40.0000 mg | SUBCUTANEOUS | Status: DC
  Administered 2018-11-19 – 2018-11-20 (×2): 40 mg via SUBCUTANEOUS
  Filled 2018-11-19 (×2): qty 0.4

## 2018-11-19 MED ORDER — CEPHALEXIN 500 MG PO CAPS
500.0000 mg | ORAL_CAPSULE | Freq: Three times a day (TID) | ORAL | Status: DC
  Administered 2018-11-19 – 2018-11-20 (×3): 500 mg via ORAL
  Filled 2018-11-19 (×3): qty 1

## 2018-11-19 MED ORDER — OXYCODONE HCL 5 MG PO TABS
10.0000 mg | ORAL_TABLET | Freq: Four times a day (QID) | ORAL | Status: DC | PRN
  Administered 2018-11-19 – 2018-11-20 (×3): 10 mg via ORAL
  Filled 2018-11-19 (×3): qty 2

## 2018-11-19 MED ORDER — FLUCONAZOLE 150 MG PO TABS
150.0000 mg | ORAL_TABLET | Freq: Every day | ORAL | Status: DC
  Administered 2018-11-19: 150 mg via ORAL
  Filled 2018-11-19 (×2): qty 1

## 2018-11-19 MED ORDER — ACETAMINOPHEN 500 MG PO TABS
1000.0000 mg | ORAL_TABLET | Freq: Three times a day (TID) | ORAL | Status: DC
  Administered 2018-11-19 – 2018-11-21 (×7): 1000 mg via ORAL
  Filled 2018-11-19 (×7): qty 2

## 2018-11-19 MED ORDER — PHENAZOPYRIDINE HCL 100 MG PO TABS
100.0000 mg | ORAL_TABLET | Freq: Three times a day (TID) | ORAL | Status: DC
  Administered 2018-11-19 – 2018-11-21 (×7): 100 mg via ORAL
  Filled 2018-11-19 (×8): qty 1

## 2018-11-19 MED ORDER — NAPROXEN 250 MG PO TABS
500.0000 mg | ORAL_TABLET | Freq: Two times a day (BID) | ORAL | Status: DC
  Administered 2018-11-19 – 2018-11-21 (×4): 500 mg via ORAL
  Filled 2018-11-19 (×5): qty 2

## 2018-11-19 MED ORDER — AMOXICILLIN 500 MG PO CAPS
500.0000 mg | ORAL_CAPSULE | Freq: Three times a day (TID) | ORAL | Status: DC
  Administered 2018-11-19: 500 mg via ORAL
  Filled 2018-11-19: qty 1

## 2018-11-19 NOTE — Progress Notes (Signed)
Abx change:  Pt was narrow to amoxicillin for the staph aureus in the urine. Pt has complicated UTI/pyelo. The staph aureus is a MSSA but it's a beta lactamase producer after confirming with lab. It's resistant to PCN (~95% will). D/w with FMTS today to change to either Augmentin or Keflex. Keflex was chosen with the same stop date.  Dc amoxicillin Keflex 500mg  TID til 11/29/18  Onnie Boer, PharmD, BCIDP, AAHIVP, CPP Infectious Disease Pharmacist 11/19/2018 3:18 PM

## 2018-11-19 NOTE — Progress Notes (Signed)
Patient very rude to Probation officer. Pt is being accusatory of not giving her pain medication. Explained to patient that on the contrary I am giving her pain med early. Remains unhappy to Probation officer and  her primary nurse. I do not want that bed alarm." I am here for my kidneys."  Patient refused bed alarm. Told patient I am not coming back to give her medications due to her ugly behavior. " There will be somebody to give her medications that she is paying."

## 2018-11-19 NOTE — Progress Notes (Signed)
Family Medicine Teaching Service Daily Progress Note Intern Pager: [phone]  Patient name: Kimberly [name] Medical record number: [id number] Date of birth: February 14, 1961 Age: 57 y.o. Gender: female  Primary Care Provider: Patient, No Pcp Per Consultants: Urology, IR Code Status: Full  Pt Overview and Major Events to Date:  Admitted - 11/17/2018  Assessment and Plan:  Kimberly [name] is a 57 y.o. female presenting with dysuria, flank/abdominal pain and chills secondary to pyelonephritis. PMH is significant for Hepatitis C, Bipolar depression, Anxiety, GERD, Ureteral stones, and recurrent UTIs.   Hydronephrosis and Pyelonephritis: POD #1, s/p nephrostomy tube placement on 12/25.   Patient reports significant lower abdominal and urinary pain, Dilaudid ineffective.  Frequent urination.  Leukocytosis improved 12.3, from 13.7.  Urine culture > 100,000 staph aureus, found to be pansensitive. -Urology on board, will follow-up outpatient -DC V ceftriaxone (12/24-12/26), start amoxicillin 500mg  q 8 hours for an additional 8 days (12/26-) -DC Dilaudid 0.5 mg every 3 PRN, start naproxen 500 mg twice daily -Scheduled Tylenol 1000mg  3 times daily for pain -Start Pyridium for dysuria -Zofran as needed for nausea - Vitals per routine -Monitor BMP, CBC -Strict I and O  Tobacco Use Disorder: Chronic. 1 PPD, 26-pack-year history. -Nicotine patch 14 mg  Alcohol Use Disorder:  Chronic. CIWA max 2 overnight.  Drinks 18 beers weekly. -Continue CIWA protocol - Ativan 1 mg for CIWA >8  - Continue multivitamin, thiamine  Hypokalemia: Resolved. K4.2, from 3.3 yesterday.   -Monitor BMP  FEN/GI: Regular diet as tolerated Prophylaxis: Lovenox  Disposition: Continued improvement of pain control, follow-up in the afternoon.  If pain better controlled, may discharge this afternoon.   Subjective:  Patient reports significant lower abdominal pain and dysuria.  States she was up all night and pain,  however did not receive any pages to change pain medication.  She also endorses frequent urination.  Denies any nausea, vomiting, change in bowel movements.   Objective: Temp:  [98.2 F (36.8 C)-99.1 F (37.3 C)] 98.2 F (36.8 C) (12/26 0406) Pulse Rate:  [81-101] 81 (12/26 0406) Resp:  [14-18] 16 (12/26 0406) BP: (119-145)/(74-99) 139/77 (12/26 0406) SpO2:  [94 %-100 %] 97 % (12/26 0406) Weight:  [61.2 kg] 61.2 kg (12/25 1001)  Physical Exam: General: Alert, NAD HEENT: NCAT, MMM, oropharynx nonerythematous  Cardiac: RRR no m/g/r Lungs: Clear bilaterally, no increased WOB  Abdomen: soft, tender to suprapubic region, non-distended, normoactive BS.  300 cc serosanguineous fluid/urine drained from nephrostomy tube Msk: Moves all extremities spontaneously  Ext: Warm, dry, 2+ distal pulses, no edema   Laboratory: Recent Labs  Lab 11/17/18 1507 11/18/18 0638 11/19/18 0529  WBC 16.2* 13.7* 12.3*  HGB 14.1 12.2 12.9  HCT 42.6 37.4 39.7  PLT 335 284 333   Recent Labs  Lab 11/17/18 1507 11/18/18 0638 11/19/18 0529  NA 135 136 133*  K 3.8 3.3* 4.2  CL 104 106 101  CO2 15* 18* 22  BUN 12 7 8   CREATININE 1.01* 0.89 0.87  CALCIUM 9.1 8.9 9.1  PROT 8.4*  --   --   BILITOT 0.4  --   --   ALKPHOS 83  --   --   ALT 26  --   --   AST 30  --   --   GLUCOSE 118* 96 110*   Urinalysis    Component Value Date/Time   COLORURINE AMBER (A) 11/17/2018 1632   APPEARANCEUR TURBID (A) 11/17/2018 1632   LABSPEC 1.023 11/17/2018 1632   PHURINE 5.0 11/17/2018  [place] 11/17/2018 1632   HGBUR MODERATE (A) 11/17/2018 1632   BILIRUBINUR NEGATIVE 11/17/2018 1632   KETONESUR 20 (A) 11/17/2018 1632   PROTEINUR 100 (A) 11/17/2018 1632   UROBILINOGEN 0.2 09/07/2018 1909   NITRITE NEGATIVE 11/17/2018 1632   LEUKOCYTESUR LARGE (A) 11/17/2018 1632   Lipase: 22 Urine culture: pending  Imaging/Diagnostic Tests: Ct Renal Stone Study  Result Date: 11/17/2018 CLINICAL DATA:   Abdominal and flank pain for 2 days. Nephrolithiasis. EXAM: CT ABDOMEN AND PELVIS WITHOUT CONTRAST TECHNIQUE: Multidetector CT imaging of the abdomen and pelvis was performed following the standard protocol without IV contrast. COMPARISON:  07/22/2017 FINDINGS: Lower chest: No acute findings. Hepatobiliary: No mass visualized on this unenhanced exam. Gallbladder is unremarkable. Pancreas: No mass or inflammatory process visualized on this unenhanced exam. Spleen:  Within normal limits in size. Adrenals/Urinary tract: Adrenal glands and right kidney are unremarkable. Severe left hydronephrosis shows no significant change. A left ureteral stent is now seen in appropriate position, however there is calcification seen surrounding the proximal and distal pigtail loops, raising suspicion for stent occlusion. Two new calculi are seen in the lower pole collecting system of the left kidney, largest measuring 9 mm. Stomach/Bowel: No evidence of obstruction, inflammatory process, or abnormal fluid collections. Vascular/Lymphatic: No pathologically enlarged lymph nodes identified. No evidence of abdominal aortic aneurysm. Aortic atherosclerosis. Reproductive: Partially calcified posterior uterine fibroid again seen measuring 3.2 cm. Adnexal regions are unremarkable. Other:  None. Musculoskeletal:  No suspicious bone lesions identified. IMPRESSION: Stable severe left hydronephrosis despite left ureteral stent in appropriate position. Calcification is seen surrounding the proximal and distal pigtail loops, suspicious for stent occlusion. Nonobstructing calculi in left lower pole renal collecting system, largest measuring 9 mm. Stable small calcified uterine fibroid. Electronically Signed   By: Earle Gell M.D.   On: 11/17/2018 16:45   Ir Nephrostomy Placement Left  Result Date: 11/18/2018 INDICATION: Obstructing left UPJ calculus and occluded stent.  Hydronephrosis. EXAM: ULTRASOUND FLUOROSCOPIC LEFT NEPHROSTOMY INSERTION  COMPARISON:  [place] 11/17/2018 MEDICATIONS: Patient is on Rocephin as an inpatient. ANESTHESIA/SEDATION: Fentanyl 50 mcg IV; Versed 1.5 mg IV Moderate Sedation Time:  10 minutes The patient was continuously monitored during the procedure by the interventional radiology nurse under my direct supervision. CONTRAST:  5 cc Isovue-administered into the collecting system(s) FLUOROSCOPY TIME:  Fluoroscopy Time: 0 minutes 18 seconds (1.0 mGy). COMPLICATIONS: None immediate. PROCEDURE: Informed written consent was obtained from the patient after a thorough discussion of the procedural risks, benefits and alternatives. All questions were addressed. Maximal Sterile Barrier Technique was utilized including caps, mask, sterile gowns, sterile gloves, sterile drape, hand hygiene and skin antiseptic. A timeout was performed prior to the initiation of the procedure. Previous imaging reviewed. Patient position prone. Ultrasound localization performed demonstrating the hydronephrotic left kidney. Overlying skin marked. Under sterile conditions and local anesthesia, ultrasound guidance was utilized to access the severely hydronephrotic left kidney through a midpole calyx. Needle position confirmed with ultrasound. Images obtained for documentation. Guidewire inserted followed by tract dilatation to insert a 10 French nephrostomy. Retention loop formed in the renal pelvis. Position confirmed with fluoroscopy. Images obtained for documentation. There was return of exudative urine. Catheter secured with Prolene suture and connected to external gravity drainage bag. No immediate complication. Patient tolerated the procedure well. IMPRESSION: Successful ultrasound and fluoroscopic 10 French left nephrostomy Electronically Signed   By: Jerilynn Mages.  Shick M.D.   On: 11/18/2018 12:07    Patriciaann Clan, DO 11/19/2018, 7:53 AM PGY-1,  [place] Intern pager: [phone], text pages welcome

## 2018-11-20 LAB — CBC
HCT: 37 % (ref 36.0–46.0)
Hemoglobin: 12.1 g/dL (ref 12.0–15.0)
MCH: 33.2 pg (ref 26.0–34.0)
MCHC: 32.7 g/dL (ref 30.0–36.0)
MCV: 101.6 fL — ABNORMAL HIGH (ref 80.0–100.0)
Platelets: 328 10*3/uL (ref 150–400)
RBC: 3.64 MIL/uL — ABNORMAL LOW (ref 3.87–5.11)
RDW: 13.2 % (ref 11.5–15.5)
WBC: 9 10*3/uL (ref 4.0–10.5)
nRBC: 0 % (ref 0.0–0.2)

## 2018-11-20 LAB — BASIC METABOLIC PANEL
Anion gap: 10 (ref 5–15)
BUN: 12 mg/dL (ref 6–20)
CO2: 23 mmol/L (ref 22–32)
Calcium: 8.9 mg/dL (ref 8.9–10.3)
Chloride: 102 mmol/L (ref 98–111)
Creatinine, Ser: 0.8 mg/dL (ref 0.44–1.00)
GFR calc Af Amer: >60 mL/min (ref >60)
GFR calc non Af Amer: >60 mL/min (ref >60)
Glucose, Bld: 100 mg/dL — ABNORMAL HIGH (ref 70–99)
Potassium: 4.4 mmol/L (ref 3.5–5.1)
Sodium: 135 mmol/L (ref 135–145)

## 2018-11-20 MED ORDER — TRAMADOL HCL 50 MG PO TABS
50.0000 mg | ORAL_TABLET | Freq: Once | ORAL | Status: AC
  Administered 2018-11-20: 50 mg via ORAL
  Filled 2018-11-20: qty 1

## 2018-11-20 MED ORDER — ALUM & MAG HYDROXIDE-SIMETH 200-200-20 MG/5ML PO SUSP
30.0000 mL | Freq: Once | ORAL | Status: AC
  Administered 2018-11-20: 30 mL via ORAL
  Filled 2018-11-20: qty 30

## 2018-11-20 MED ORDER — CEPHALEXIN 500 MG PO CAPS
500.0000 mg | ORAL_CAPSULE | Freq: Four times a day (QID) | ORAL | Status: DC
  Administered 2018-11-20 – 2018-11-21 (×5): 500 mg via ORAL
  Filled 2018-11-20 (×5): qty 1

## 2018-11-20 MED ORDER — LIDOCAINE VISCOUS HCL 2 % MT SOLN
15.0000 mL | Freq: Once | OROMUCOSAL | Status: AC
  Administered 2018-11-20: 15 mL via ORAL
  Filled 2018-11-20: qty 15

## 2018-11-20 MED ORDER — TRAMADOL HCL 50 MG PO TABS
50.0000 mg | ORAL_TABLET | Freq: Four times a day (QID) | ORAL | Status: DC | PRN
  Administered 2018-11-20 – 2018-11-21 (×5): 50 mg via ORAL
  Filled 2018-11-20 (×5): qty 1

## 2018-11-20 NOTE — Progress Notes (Addendum)
Family Medicine Teaching Service Daily Progress Note Intern Pager: [phone]  Patient name: Kimberly [name] Medical record number: [id number] Date of birth: 1961-04-10 Age: 57 y.o. Gender: female  Primary Care Provider: Patient, No Pcp Per Consultants: Urology, IR Code Status: Full  Pt Overview and Major Events to Date:  Admitted - 11/17/2018  Assessment and Plan:  Kimberly [name] is a 57 y.o. female presenting with dysuria, flank/abdominal pain and chills secondary to pyelonephritis. PMH is significant for Hepatitis C, Bipolar depression, Anxiety, GERD, Ureteral stones, and recurrent UTIs.   Hydronephrosis and Pyelonephritis: POD #2, s/p nephrostomy tube placement.   Abdominal pain and dysuria continue to improve.  Leukocytosis resolved.  Will need to follow-up with urology outpatient, patient will make an appointment with a urological resident that should be more cost effective for her.  Also spoke with IR in terms of following up, they recommended she will follow-up with urology first, and will be sent back to IR as needed for her tube.  Nephrostomy tube care does not required skilled nursing, will have RN teach patient tube drainage and dry dressing changes daily. -Urology on board, will follow-up outpatient -Keflex PO (12/26-11/26/2018) -Cont naproxen 500 mg twice daily -Scheduled Tylenol 1000mg  3 times daily for pain -Transition to tramadol 50 every 6 PRN  - Pyridium for dysuria -Zofran as needed for nausea - Vitals per routine -Monitor BMP, CBC -Strict I and O  Tobacco Use Disorder: Chronic. 1 PPD, 26-pack-year history. -Nicotine patch 14 mg  Alcohol Use Disorder:  Chronic. CIWA max 0 overnight.  Drinks 18 beers weekly. -Continue CIWA protocol - Ativan 1 mg for CIWA >8  - Continue multivitamin, thiamine  Hypokalemia: Resolved. K 4.4, from 4.2 yesterday.   -Monitor BMP  FEN/GI: Regular diet as tolerated Prophylaxis: Lovenox  Disposition: Likely discharge this  afternoon, will ensure urological follow-up and have RN teach dressing change and nephrostomy tube draining  Subjective:  Doing better this morning.  States her pain is more under control and she is having less dysuria.  Objective: Temp:  [97.9 F (36.6 C)-98.3 F (36.8 C)] 98.3 F (36.8 C) (12/27 0419) Pulse Rate:  [72-79] 72 (12/27 0419) Resp:  [16-18] 18 (12/27 0419) BP: (137-163)/(81-99) 163/81 (12/27 0419) SpO2:  [97 %-100 %] 98 % (12/27 0419) Weight:  [61.5 kg] 61.5 kg (12/26 2016)  Physical Exam: General: Alert, NAD HEENT: NCAT, MMM, oropharynx nonerythematous  Cardiac: RRR no m/g/r Lungs: Clear bilaterally, no increased WOB  Abdomen: soft, minimally tender to suprapubic region, non-distended, normoactive BS, 100 cc of light pink/yellow urine draining from nephrostomy tube. Msk: Moves all extremities spontaneously  Ext: Warm, dry, 2+ distal pulses, no edema   Laboratory: Recent Labs  Lab 11/18/18 0638 11/19/18 0529 11/20/18 0423  WBC 13.7* 12.3* 9.0  HGB 12.2 12.9 12.1  HCT 37.4 39.7 37.0  PLT 284 333 328   Recent Labs  Lab 11/17/18 1507 11/18/18 0638 11/19/18 0529 11/20/18 0423  NA 135 136 133* 135  K 3.8 3.3* 4.2 4.4  CL 104 106 101 102  CO2 15* 18* 22 23  BUN 12 7 8 12   CREATININE 1.01* 0.89 0.87 0.80  CALCIUM 9.1 8.9 9.1 8.9  PROT 8.4*  --   --   --   BILITOT 0.4  --   --   --   ALKPHOS 83  --   --   --   ALT 26  --   --   --   AST 30  --   --   --  GLUCOSE 118* 96 110* 100*   Urinalysis    Component Value Date/Time   COLORURINE AMBER (A) 11/17/2018 1632   APPEARANCEUR TURBID (A) 11/17/2018 1632   LABSPEC 1.023 11/17/2018 1632   PHURINE 5.0 11/17/2018 1632   GLUCOSEU NEGATIVE 11/17/2018 1632   HGBUR MODERATE (A) 11/17/2018 1632   BILIRUBINUR NEGATIVE 11/17/2018 1632   KETONESUR 20 (A) 11/17/2018 1632   PROTEINUR 100 (A) 11/17/2018 1632   UROBILINOGEN 0.2 09/07/2018 1909   NITRITE NEGATIVE 11/17/2018 1632   LEUKOCYTESUR LARGE (A)  11/17/2018 1632   Lipase: 22 Urine culture: pending  Imaging/Diagnostic Tests: Ct Renal Stone Study  Result Date: 11/17/2018 CLINICAL DATA:  Abdominal and flank pain for 2 days. Nephrolithiasis. EXAM: CT ABDOMEN AND PELVIS WITHOUT CONTRAST TECHNIQUE: Multidetector CT imaging of the abdomen and pelvis was performed following the standard protocol without IV contrast. COMPARISON:  07/22/2017 FINDINGS: Lower chest: No acute findings. Hepatobiliary: No mass visualized on this unenhanced exam. Gallbladder is unremarkable. Pancreas: No mass or inflammatory process visualized on this unenhanced exam. Spleen:  Within normal limits in size. Adrenals/Urinary tract: Adrenal glands and right kidney are unremarkable. Severe left hydronephrosis shows no significant change. A left ureteral stent is now seen in appropriate position, however there is calcification seen surrounding the proximal and distal pigtail loops, raising suspicion for stent occlusion. Two new calculi are seen in the lower pole collecting system of the left kidney, largest measuring 9 mm. Stomach/Bowel: No evidence of obstruction, inflammatory process, or abnormal fluid collections. Vascular/Lymphatic: No pathologically enlarged lymph nodes identified. No evidence of abdominal aortic aneurysm. Aortic atherosclerosis. Reproductive: Partially calcified posterior uterine fibroid again seen measuring 3.2 cm. Adnexal regions are unremarkable. Other:  None. Musculoskeletal:  No suspicious bone lesions identified. IMPRESSION: Stable severe left hydronephrosis despite left ureteral stent in appropriate position. Calcification is seen surrounding the proximal and distal pigtail loops, suspicious for stent occlusion. Nonobstructing calculi in left lower pole renal collecting system, largest measuring 9 mm. Stable small calcified uterine fibroid. Electronically Signed   By: Earle Gell M.D.   On: 11/17/2018 16:45   Ir Nephrostomy Placement Left  Result Date:  11/18/2018 INDICATION: Obstructing left UPJ calculus and occluded stent.  Hydronephrosis. EXAM: ULTRASOUND FLUOROSCOPIC LEFT NEPHROSTOMY INSERTION COMPARISON:  [facility] 11/17/2018 MEDICATIONS: Patient is on Rocephin as an inpatient. ANESTHESIA/SEDATION: Fentanyl 50 mcg IV; Versed 1.5 mg IV Moderate Sedation Time:  10 minutes The patient was continuously monitored during the procedure by the interventional radiology nurse under my direct supervision. CONTRAST:  5 cc Isovue-administered into the collecting system(s) FLUOROSCOPY TIME:  Fluoroscopy Time: 0 minutes 18 seconds (1.0 mGy). COMPLICATIONS: None immediate. PROCEDURE: Informed written consent was obtained from the patient after a thorough discussion of the procedural risks, benefits and alternatives. All questions were addressed. Maximal Sterile Barrier Technique was utilized including caps, mask, sterile gowns, sterile gloves, sterile drape, hand hygiene and skin antiseptic. A timeout was performed prior to the initiation of the procedure. Previous imaging reviewed. Patient position prone. Ultrasound localization performed demonstrating the hydronephrotic left kidney. Overlying skin marked. Under sterile conditions and local anesthesia, ultrasound guidance was utilized to access the severely hydronephrotic left kidney through a midpole calyx. Needle position confirmed with ultrasound. Images obtained for documentation. Guidewire inserted followed by tract dilatation to insert a 10 French nephrostomy. Retention loop formed in the renal pelvis. Position confirmed with fluoroscopy. Images obtained for documentation. There was return of exudative urine. Catheter secured with Prolene suture and connected to external gravity drainage bag. No immediate complication.  Patient tolerated the procedure well. IMPRESSION: Successful ultrasound and fluoroscopic 10 French left nephrostomy Electronically Signed   By: Jerilynn Mages.  Shick M.D.   On: 11/18/2018 12:07    Patriciaann Clan,  DO 11/20/2018, 7:03 AM PGY-1, [place] Intern pager: [phone], text pages welcome

## 2018-11-20 NOTE — Progress Notes (Signed)
Bladder scan show 67 mLs

## 2018-11-20 NOTE — Care Management Note (Addendum)
Case Management Note  Patient Details  Name: Kimberly [name] MRN: [id number] Date of Birth: Apr 29, 1961  Subjective/Objective:                    Action/Plan:Dr Higinio Plan called back wanting patient to follow up at [place], first available appointment December 08, 2018 at 1000 am    Update spoke with MD, patient is discharging to home today. MD will send scripts to [facility]. Will ask bedside nurse to provide teaching for nephrostomy tube. Spoke with nurse , she has already done teaching. Family Medicine will schedule a follow up appointment and enter on discharge instructions.  Spoke with Dr Irish Elders , T/S making rounds on patient now. MD will call NCM back with plan of care.   Asked if prescriptions can be sent to [facility] if patient is discharged today. Patient entered into [facility] program. Asked if T/S will follow OP for PCP, also asked if home health needed. Expected Discharge Date:                  Expected Discharge Plan:     In-House Referral:  Financial Counselor  Discharge planning Services  Medication Assistance, [place] [facility], [facility], CM Consult  Post Acute Care Choice:  NA Choice offered to:     DME Arranged:    DME Agency:     HH Arranged:    HH Agency:     Status of Service:  In process, will continue to follow  If discussed at Long Length of Stay Meetings, dates discussed:    Additional Comments:  Marilu Favre, RN 11/20/2018, 10:58 AM

## 2018-11-21 LAB — BASIC METABOLIC PANEL
Anion gap: 10 (ref 5–15)
BUN: 8 mg/dL (ref 6–20)
CO2: 26 mmol/L (ref 22–32)
Calcium: 8.9 mg/dL (ref 8.9–10.3)
Chloride: 100 mmol/L (ref 98–111)
Creatinine, Ser: 0.71 mg/dL (ref 0.44–1.00)
GFR calc Af Amer: >60 mL/min (ref >60)
GFR calc non Af Amer: >60 mL/min (ref >60)
Glucose, Bld: 87 mg/dL (ref 70–99)
Potassium: 4 mmol/L (ref 3.5–5.1)
Sodium: 136 mmol/L (ref 135–145)

## 2018-11-21 LAB — CBC
HCT: 38 % (ref 36.0–46.0)
Hemoglobin: 12.4 g/dL (ref 12.0–15.0)
MCH: 33.2 pg (ref 26.0–34.0)
MCHC: 32.6 g/dL (ref 30.0–36.0)
MCV: 101.9 fL — ABNORMAL HIGH (ref 80.0–100.0)
Platelets: 350 10*3/uL (ref 150–400)
RBC: 3.73 MIL/uL — ABNORMAL LOW (ref 3.87–5.11)
RDW: 13.2 % (ref 11.5–15.5)
WBC: 7.6 10*3/uL (ref 4.0–10.5)
nRBC: 0 % (ref 0.0–0.2)

## 2018-11-21 MED ORDER — ACETAMINOPHEN 500 MG PO TABS: 1000.0000 mg | ORAL_TABLET | Freq: Three times a day (TID) | ORAL | 0 refills | Status: DC

## 2018-11-21 MED ORDER — TRAMADOL HCL 50 MG PO TABS: 50.0000 mg | ORAL_TABLET | Freq: Four times a day (QID) | ORAL | 0 refills | Status: DC | PRN

## 2018-11-21 MED ORDER — CEPHALEXIN 500 MG PO CAPS: 500.0000 mg | ORAL_CAPSULE | Freq: Four times a day (QID) | ORAL | 0 refills | Status: AC

## 2018-11-21 MED ORDER — CEPHALEXIN 500 MG PO CAPS: 500.0000 mg | ORAL_CAPSULE | Freq: Four times a day (QID) | ORAL | 0 refills | Status: DC

## 2018-11-21 MED ORDER — ACETAMINOPHEN 500 MG PO TABS: 1000.0000 mg | ORAL_TABLET | Freq: Three times a day (TID) | ORAL | 0 refills | Status: AC

## 2018-11-21 MED ORDER — FOLIC ACID 1 MG PO TABS
1.0000 mg | ORAL_TABLET | Freq: Every day | ORAL | Status: DC
  Administered 2018-11-21: 1 mg via ORAL

## 2018-11-21 MED ORDER — ADULT MULTIVITAMIN W/MINERALS CH
1.0000 | ORAL_TABLET | Freq: Every day | ORAL | Status: DC
  Administered 2018-11-21: 1 via ORAL

## 2018-11-21 MED ORDER — NAPROXEN 500 MG PO TABS: 500.0000 mg | ORAL_TABLET | Freq: Two times a day (BID) | ORAL | 0 refills | Status: DC | PRN

## 2018-11-21 NOTE — Discharge Instructions (Signed)
You were admitted to [facility] for a urine and kidney infection, that caused your kidneys to be extremely swollen.  While you were here, you were started on antibiotics and will continue keflex until January 2nd.  You also had a kidney tube drain placed, this will stay in place until you follow-up with urology.  Absolutely make sure that you follow-up urology to monitor this tube and continued interventions needed to help your kidneys and bladder heal.  We will send you home with some additional pain medications.  Hopefully by that time your pain will be improved.  Also, please continue the process to establish Medicaid insurance for healthcare, this can be continued at [facility]    Hydronephrosis  Hydronephrosis is the swelling of one or both kidneys due to a blockage that stops urine from flowing out of the body. Kidneys filter waste from the blood and produce urine. This condition can lead to kidney failure and may become life threatening if not treated promptly. What are the causes? Common causes of this condition include:  Problems that occur when a baby is developing in the womb (congenital defect). These can include problems: ? In the kidneys. ? In the tubes that drain urine from the kidneys into the bladder (ureters).  Kidney stones.  Bladder infection.  An enlarged prostate gland.  Scar tissue from a previous surgery or injury.  A blood clot.  A tumor or cyst in the abdomen or pelvis.  Cancer of the prostate, bladder, uterus, ovary, or colon. What are the signs or symptoms? Symptoms of this condition include:  Pain or discomfort in your side (flank).  Pain and swelling in your abdomen.  Nausea and vomiting.  Fever.  Pain when passing urine.  Feelings of urgency when you need to urinate.  Urinating more often than normal. In some cases, you may not have any symptoms. How is this diagnosed? This condition may be diagnosed based on:  Your symptoms  and medical history.  A physical exam.  Blood and urine tests.  Imaging tests, such as an ultrasound, CT scan, or MRI.  A procedure in which a scope is inserted into the urethra and used to view parts of the urinary tract and bladder (cystoscopy). How is this treated? Treatment for this condition depends on where the blockage is, how long it has been there, and what caused it. The goal of treatment is to remove the blockage. Treatment may include:  Antibiotic medicines to treat or prevent infection.  A procedure to place a small, thin tube (stent) into a blocked ureter. The stent will keep the ureter open so that urine can drain through it.  A nonsurgical procedure that crushes kidney stones with shock waves (extracorporeal shock wave lithotripsy).  If kidney failure occurs, treatment may include dialysis or a kidney transplant. Follow these instructions at home:   Take over-the-counter and prescription medicines only as told by your health care provider.  Rest and return to your normal activities as told by your health care provider. Ask your health care provider what activities are safe for you.  Drink enough fluid to keep your urine pale yellow.  If you were prescribed an antibiotic medicine, take it exactly as told by your health care provider. Do not stop taking the antibiotic even if you start to feel better.  Keep all follow-up visits as told by your health care provider. This is important. Contact a health care provider if:  You continue to have symptoms after  treatment.  You develop new symptoms.  Your urine becomes cloudy or bloody.  You have a fever. Get help right away if:  You have severe flank or abdominal pain.  You cannot drink fluids without vomiting. Summary  Hydronephrosis is the swelling of one or both kidneys due to a blockage that stops urine from flowing out of the body.  Hydronephrosis can lead to kidney failure and may become life threatening  if not treated promptly.  The goal of treatment is to treat the cause of the blockage. It may include insertion of stent into a blocked ureter, a procedure to treat kidney stones, and antibiotic medicines.  Follow your health care provider's instructions for taking care of yourself at home, including instructions about drinking fluids, taking medicines, and limiting activities. This information is not intended to replace advice given to you by your health care provider. Make sure you discuss any questions you have with your health care provider. Document Released: 09/08/2007 Document Revised: 11/22/2017 Document Reviewed: 11/22/2017 Elsevier Interactive Patient Education  2019 Else[employer] Inc.   Pyelonephritis, Adult  Pyelonephritis is a kidney infection. The kidneys are organs that help clean your blood by moving waste out of your blood and into your pee (urine). This infection can happen quickly, or it can last for a long time. In most cases, it clears up with treatment and does not cause other problems. Follow these instructions at home: Medicines  Take over-the-counter and prescription medicines only as told by your doctor.  Take your antibiotic medicine as told by your doctor. Do not stop taking the medicine even if you start to feel better. General instructions  Drink enough fluid to keep your pee clear or pale yellow.  Avoid caffeine, tea, and carbonated drinks.  Pee (urinate) often. Avoid holding in pee for long periods of time.  Pee before and after sex.  After pooping (having a bowel movement), women should wipe from front to back. Use each tissue only once.  Keep all follow-up visits as told by your doctor. This is important. Contact a doctor if:  You do not feel better after 2 days.  Your symptoms get worse.  You have a fever. Get help right away if:  You cannot take your medicine or drink fluids as told.  You have chills and shaking.  You throw up  (vomit).  You have very bad pain in your side (flank) or back.  You feel very weak or you pass out (faint). This information is not intended to replace advice given to you by your health care provider. Make sure you discuss any questions you have with your health care provider. Document Released: 12/19/2004 Document Revised: 04/18/2016 Document Reviewed: 03/06/2015 Elsevier Interactive Patient Education  [employer].

## 2018-11-21 NOTE — Progress Notes (Signed)
Family Medicine Teaching Service Daily Progress Note Intern Pager: [phone]  Patient name: Kimberly [name] Medical record number: [id number] Date of birth: August 24, 1961 Age: 57 y.o. Gender: female  Primary Care Provider: Patient, No Pcp Per Consultants: Urology, IR Code Status: Full  Pt Overview and Major Events to Date:  Admitted - 11/17/2018  Assessment and Plan:  Kimberly [name] is a 57 y.o. female presenting with dysuria, flank/abdominal pain and chills secondary to pyelonephritis. PMH is significant for Hepatitis C, Bipolar depression, Anxiety, GERD, Ureteral stones, and recurrent UTIs.   Hydronephrosis and Pyelonephritis: POD #3, s/p nephrostomy tube placement Continues to endorse some discomfort in her abdomen and near the tubes but feels it is relatively controlled with the pain meds we have her on in the hospital. Leukocytosis resolved. Plan for Urology f/u outpt. Nursing completed teaching for dressing changes and drain changes. -Urology on board, pt to follow-up outpatient -Continue day #3 Keflex (12/26-11/26/2018) -Cont naproxen 500 mg twice daily -Scheduled Tylenol 1000mg  3 times daily for pain - tramadol 50 every 6 PRN  - Pyridium PRN dysuria -Zofran as needed for nausea - Vitals per routine -Monitor BMP, CBC -Strict I and O  Tobacco Use Disorder: Chronic. 1 PPD, 26-pack-year history. -Nicotine patch 14 mg  Alcohol Use Disorder:  Chronic. CIWA not scored overnight.  Previously zero. CIWA order may have been DC'd by accident, will re-order. Drinks 18 beers weekly. -Continue CIWA protocol - Continue multivitamin, thiamine  FEN/GI: Regular diet as tolerated Prophylaxis: Lovenox  Disposition: Likely discharge this afternoon, will ensure urological follow-up and have RN teach dressing change and nephrostomy tube draining  Subjective:  No acute events overnight. Received teaching for bandage and drain changes daily. Has an aunt at home who can help her with this.  Feels comfortable with leaving the hospital today. Pain controlled. Understands follow up plan.  Objective: Temp:  [97.8 F (36.6 C)-98.2 F (36.8 C)] 97.8 F (36.6 C) (12/28 0900) Pulse Rate:  [68-75] 70 (12/28 0900) Resp:  [18] 18 (12/28 0900) BP: (138-156)/(71-82) 150/82 (12/28 0900) SpO2:  [98 %-99 %] 99 % (12/28 0900) Weight:  [61.1 kg] 61.1 kg (12/27 1900) Physical Exam:  General: Alert, conversant Cardiac: RRR, no M/R/G Lungs: CTA bil, no W/R?R Abdomen: soft, nontender, non-distended, normoactive BS, nephrostomy wound with bandage c/d/i Msk: Moves all extremities spontaneously  Ext: Warm, dry, 2+ distal pulses, no edema   Laboratory: Recent Labs  Lab 11/19/18 0529 11/20/18 0423 11/21/18 0430  WBC 12.3* 9.0 7.6  HGB 12.9 12.1 12.4  HCT 39.7 37.0 38.0  PLT 333 328 350   Recent Labs  Lab 11/17/18 1507  11/19/18 0529 11/20/18 0423 11/21/18 0430  NA 135   < > 133* 135 136  K 3.8   < > 4.2 4.4 4.0  CL 104   < > 101 102 100  CO2 15*   < > 22 23 26   BUN 12   < > 8 12 8   CREATININE 1.01*   < > 0.87 0.80 0.71  CALCIUM 9.1   < > 9.1 8.9 8.9  PROT 8.4*  --   --   --   --   BILITOT 0.4  --   --   --   --   ALKPHOS 83  --   --   --   --   ALT 26  --   --   --   --   AST 30  --   --   --   --  GLUCOSE 118*   < > 110* 100* 87   < > = values in this interval not displayed.   Urinalysis    Component Value Date/Time   COLORURINE AMBER (A) 11/17/2018 1632   APPEARANCEUR TURBID (A) 11/17/2018 1632   LABSPEC 1.023 11/17/2018 1632   PHURINE 5.0 11/17/2018 1632   GLUCOSEU NEGATIVE 11/17/2018 1632   HGBUR MODERATE (A) 11/17/2018 1632   BILIRUBINUR NEGATIVE 11/17/2018 1632   KETONESUR 20 (A) 11/17/2018 1632   PROTEINUR 100 (A) 11/17/2018 1632   UROBILINOGEN 0.2 09/07/2018 1909   NITRITE NEGATIVE 11/17/2018 1632   LEUKOCYTESUR LARGE (A) 11/17/2018 1632   Lipase: 22 Urine culture: pending  Imaging/Diagnostic Tests: Ct Renal Stone Study  Result Date:  11/17/2018 CLINICAL DATA:  Abdominal and flank pain for 2 days. Nephrolithiasis. EXAM: CT ABDOMEN AND PELVIS WITHOUT CONTRAST TECHNIQUE: Multidetector CT imaging of the abdomen and pelvis was performed following the standard protocol without IV contrast. COMPARISON:  07/22/2017 FINDINGS: Lower chest: No acute findings. Hepatobiliary: No mass visualized on this unenhanced exam. Gallbladder is unremarkable. Pancreas: No mass or inflammatory process visualized on this unenhanced exam. Spleen:  Within normal limits in size. Adrenals/Urinary tract: Adrenal glands and right kidney are unremarkable. Severe left hydronephrosis shows no significant change. A left ureteral stent is now seen in appropriate position, however there is calcification seen surrounding the proximal and distal pigtail loops, raising suspicion for stent occlusion. Two new calculi are seen in the lower pole collecting system of the left kidney, largest measuring 9 mm. Stomach/Bowel: No evidence of obstruction, inflammatory process, or abnormal fluid collections. Vascular/Lymphatic: No pathologically enlarged lymph nodes identified. No evidence of abdominal aortic aneurysm. Aortic atherosclerosis. Reproductive: Partially calcified posterior uterine fibroid again seen measuring 3.2 cm. Adnexal regions are unremarkable. Other:  None. Musculoskeletal:  No suspicious bone lesions identified. IMPRESSION: Stable severe left hydronephrosis despite left ureteral stent in appropriate position. Calcification is seen surrounding the proximal and distal pigtail loops, suspicious for stent occlusion. Nonobstructing calculi in left lower pole renal collecting system, largest measuring 9 mm. Stable small calcified uterine fibroid. Electronically Signed   By: Earle Gell M.D.   On: 11/17/2018 16:45   Ir Nephrostomy Placement Left  Result Date: 11/18/2018 INDICATION: Obstructing left UPJ calculus and occluded stent.  Hydronephrosis. EXAM: ULTRASOUND FLUOROSCOPIC  LEFT NEPHROSTOMY INSERTION COMPARISON:  [facility] 11/17/2018 MEDICATIONS: Patient is on Rocephin as an inpatient. ANESTHESIA/SEDATION: Fentanyl 50 mcg IV; Versed 1.5 mg IV Moderate Sedation Time:  10 minutes The patient was continuously monitored during the procedure by the interventional radiology nurse under my direct supervision. CONTRAST:  5 cc Isovue-administered into the collecting system(s) FLUOROSCOPY TIME:  Fluoroscopy Time: 0 minutes 18 seconds (1.0 mGy). COMPLICATIONS: None immediate. PROCEDURE: Informed written consent was obtained from the patient after a thorough discussion of the procedural risks, benefits and alternatives. All questions were addressed. Maximal Sterile Barrier Technique was utilized including caps, mask, sterile gowns, sterile gloves, sterile drape, hand hygiene and skin antiseptic. A timeout was performed prior to the initiation of the procedure. Previous imaging reviewed. Patient position prone. Ultrasound localization performed demonstrating the hydronephrotic left kidney. Overlying skin marked. Under sterile conditions and local anesthesia, ultrasound guidance was utilized to access the severely hydronephrotic left kidney through a midpole calyx. Needle position confirmed with ultrasound. Images obtained for documentation. Guidewire inserted followed by tract dilatation to insert a 10 French nephrostomy. Retention loop formed in the renal pelvis. Position confirmed with fluoroscopy. Images obtained for documentation. There was return of exudative urine.  Catheter secured with Prolene suture and connected to external gravity drainage bag. No immediate complication. Patient tolerated the procedure well. IMPRESSION: Successful ultrasound and fluoroscopic 10 French left nephrostomy Electronically Signed   By: Jerilynn Mages.  Shick M.D.   On: 11/18/2018 12:07    Everrett Coombe, MD 11/21/2018, 12:30 PM PGY-3, [place] Intern pager: [phone], text pages welcome

## 2018-11-21 NOTE — Progress Notes (Signed)
11/21/18:  CSW acknowledged consult for the patient. CSW reviewed the notes, RNCM has already met with the patient and referred them to financial counseling on 11/20/18 due to not having insurance.   No further needs are needed from social work.   CSW signing off.   Domenic Schwab, MSW, [place]

## 2018-11-21 NOTE — Care Management (Signed)
Patient provided with [facility] letter. Requested MD to print out scripts. Nurse will provide scripts to patient with DC paperwork. No other CM needs identified.

## 2018-11-21 NOTE — Progress Notes (Signed)
Karin Golden to be D/C'd Home   per MD order.  Discussed prescriptions and follow up appointments with the patient. Prescriptions given to patient, medication list explained in detail. Pt verbalized understanding.  Allergies as of 11/21/2018   No Known Allergies     Medication List    STOP taking these medications   BC FAST PAIN RELIEF PO   FLUoxetine 20 MG tablet Commonly known as:  PROZAC   methocarbamol 500 MG tablet Commonly known as:  ROBAXIN   omeprazole 20 MG capsule Commonly known as:  PRILOSEC   ondansetron 4 MG disintegrating tablet Commonly known as:  ZOFRAN ODT   ondansetron 4 MG tablet Commonly known as:  ZOFRAN   oxyCODONE 5 MG immediate release tablet Commonly known as:  Oxy IR/ROXICODONE   phenazopyridine 200 MG tablet Commonly known as:  PYRIDIUM   ranitidine 150 MG capsule Commonly known as:  ZANTAC   risperiDONE 2 MG tablet Commonly known as:  RISPERDAL   tamsulosin 0.4 MG Caps capsule Commonly known as:  FLOMAX     TAKE these medications   acetaminophen 500 MG tablet Commonly known as:  TYLENOL Take 2 tablets (1,000 mg total) by mouth 3 (three) times daily for 7 days.   cephALEXin 500 MG capsule Commonly known as:  KEFLEX Take 1 capsule (500 mg total) by mouth 4 (four) times daily for 22 doses.   feeding supplement (ENSURE ENLIVE) Liqd Take 237 mLs by mouth 2 (two) times daily between meals.   folic acid 1 MG tablet Commonly known as:  FOLVITE Take 1 tablet (1 mg total) by mouth daily.   naproxen 500 MG tablet Commonly known as:  NAPROSYN Take 1 tablet (500 mg total) by mouth 2 (two) times daily as needed for mild pain or moderate pain.   thiamine 100 MG tablet Take 1 tablet (100 mg total) by mouth daily.   traMADol 50 MG tablet Commonly known as:  ULTRAM Take 1 tablet (50 mg total) by mouth every 6 (six) hours as needed for moderate pain or severe pain (breakthrough pain).       Vitals:   11/21/18 0436 11/21/18 0900   BP: 138/71 (!) 150/82  Pulse: 75 70  Resp: 18 18  Temp: 98.2 F (36.8 C) 97.8 F (36.6 C)  SpO2: 99% 99%    Skin clean, dry and intact without evidence of skin break down, no evidence of skin tears noted. IV catheter discontinued intact. Site without signs and symptoms of complications. Dressing and pressure applied. Pt denies pain at this time. No complaints noted.  An After Visit Summary was printed and given to the patient. Patient escorted via [place], and D/C home via private auto.

## 2018-12-08 ENCOUNTER — Ambulatory Visit: Payer: Self-pay | Attending: Critical Care Medicine | Admitting: Critical Care Medicine

## 2018-12-08 ENCOUNTER — Encounter: Payer: Self-pay | Admitting: Critical Care Medicine

## 2018-12-08 VITALS — BP 139/84 | HR 82 | Temp 98.6°F | Resp 18 | Ht 67.0 in | Wt 147.0 lb

## 2018-12-08 DIAGNOSIS — F319 Bipolar disorder, unspecified: Secondary | ICD-10-CM | POA: Insufficient documentation

## 2018-12-08 DIAGNOSIS — N136 Pyonephrosis: Secondary | ICD-10-CM | POA: Insufficient documentation

## 2018-12-08 DIAGNOSIS — A4901 Methicillin susceptible Staphylococcus aureus infection, unspecified site: Secondary | ICD-10-CM | POA: Insufficient documentation

## 2018-12-08 DIAGNOSIS — N132 Hydronephrosis with renal and ureteral calculous obstruction: Secondary | ICD-10-CM

## 2018-12-08 DIAGNOSIS — F17210 Nicotine dependence, cigarettes, uncomplicated: Secondary | ICD-10-CM | POA: Insufficient documentation

## 2018-12-08 DIAGNOSIS — Z936 Other artificial openings of urinary tract status: Secondary | ICD-10-CM | POA: Insufficient documentation

## 2018-12-08 DIAGNOSIS — N12 Tubulo-interstitial nephritis, not specified as acute or chronic: Secondary | ICD-10-CM

## 2018-12-08 DIAGNOSIS — E876 Hypokalemia: Secondary | ICD-10-CM | POA: Insufficient documentation

## 2018-12-08 MED ORDER — TRAMADOL HCL 50 MG PO TABS: 50.0000 mg | ORAL_TABLET | Freq: Four times a day (QID) | ORAL | 0 refills | Status: DC | PRN

## 2018-12-08 MED ORDER — NAPROXEN 500 MG PO TABS: 500.0000 mg | ORAL_TABLET | Freq: Two times a day (BID) | ORAL | 1 refills | Status: DC | PRN

## 2018-12-08 MED ORDER — CEPHALEXIN 500 MG PO CAPS: 500.0000 mg | ORAL_CAPSULE | Freq: Four times a day (QID) | ORAL | 0 refills | Status: DC

## 2018-12-08 MED FILL — CEPHALEXIN 500 MG CAPSULE: 500 | 7 days supply | Qty: 30 | Fill #0

## 2018-12-08 MED FILL — NAPROXEN 500 MG TABLET: 500 | 30 days supply | Qty: 60 | Fill #0

## 2018-12-08 NOTE — Assessment & Plan Note (Signed)
Hypokalemia during recent hospitalization  Will plan follow-up basic metabolic profile and CBC at this visit

## 2018-12-08 NOTE — Progress Notes (Signed)
Subjective:    Patient ID: Kimberly [name], female    DOB: 1961/11/09, 58 y.o.   MRN: [id number]  This is a 58 year old woman who is seen in post hospital follow-up.  The patient was admitted on 11/17/2018 for left-sided abdominal pain flank pain and dysuria found to have severe pyelonephritis and left-sided hydronephrosis with a calcified ureteral stent and multiple renal calculi.  Note the patient had been seen in 2018 for renal stone disease in the left kidney and had had a ureteral stent placed by urology.  However the patient did not have adequate insurance to follow-up and have the stent removed.  The stent stayed in place over this time.  During the current hospitalization cultures grew multi-sensitive staph aureus from the urine.  Urology recommended transitioning to oral antibiotics and to obtain outpatient follow-up.  Interventional radiology placed the nephrostomy tube but urology said they would manage the drain as an outpatient.  The patient has yet to receive the urology appointment.  According to our records it is scheduled on December 17, 2018.  The patient is changing the dressing on the nephrostomy tube once a week and has run out of dressing supplies.  She is run out of pain medicine as well and is having quite significant pain in the left flank area that has been relieved with the tramadol.  She still has burning and dysuria.  She also has discolored urine coming out of the nephrostomy tube and was stone light material.  The patient denies any fever.  There is no nausea or vomiting.  The patient seen here today in follow-up.  Note the patient has a prior history of homelessness.  She now has an apartment she is living in.  She had a job at [employer] but because of the nephrostomy tube she cannot work at this time.  She has no insurance.   Significant Procedures during hospital stay 11/18/2018 - left nephrostomy  Significant Labs and Imaging:  LastLabs Recent  Labs Lab 11/19/18 0529 11/20/18 0423 11/21/18 0430 WBC 12.3* 9.0 7.6 HGB 12.9 12.1 12.4 HCT 39.7 37.0 38.0 PLT 333 328 350   LastLabs Recent Labs Lab 11/17/18 1507 11/18/18 0638 11/19/18 0529 11/20/18 0423 11/21/18 0430 NA 135 136 133* 135 136 K 3.8 3.3* 4.2 4.4 4.0 CL 104 106 101 102 100 CO2 15* 18* 22 23 26  GLUCOSE 118* 96 110* 100* 87 BUN 12 7 8 12 8  CREATININE 1.01* 0.89 0.87 0.80 0.71 CALCIUM 9.1 8.9 9.1 8.9 8.9 ALKPHOS 83  --   --   --   --  AST 30  --   --   --   --  ALT 26  --   --   --   --  ALBUMIN 3.6  --   --   --   --    Urinalysis Labs(Brief)   Component Value Date/Time  COLORURINE AMBER (A) 11/17/2018 1632  APPEARANCEUR TURBID (A) 11/17/2018 1632  LABSPEC 1.023 11/17/2018 1632  PHURINE 5.0 11/17/2018 1632  GLUCOSEU NEGATIVE 11/17/2018 1632  HGBUR MODERATE (A) 11/17/2018 1632  BILIRUBINUR NEGATIVE 11/17/2018 1632  KETONESUR 20 (A) 11/17/2018 1632  PROTEINUR 100 (A) 11/17/2018 1632  UROBILINOGEN 0.2 09/07/2018 1909  NITRITE NEGATIVE 11/17/2018 1632  LEUKOCYTESUR LARGE (A) 11/17/2018 1632   Urine culture: Growing staph aureus   Past Medical History:  Diagnosis Date  . Anxiety   . Bipolar 1 disorder ([place])   . Depression   . Hepatitis C  History reviewed. No pertinent family history.   Social History   Socioeconomic History  . Marital status: Single    Spouse name: Not on file  . Number of children: Not on file  . Years of education: Not on file  . Highest education level: Not on file  Occupational History  . Not on file  Social Needs  . Financial resource strain: Not on file  . Food insecurity:    Worry: Not on file    Inability: Not on file  . Transportation needs:    Medical: Not on file    Non-medical: Not on file  Tobacco Use  . Smoking status: Current Every Day Smoker    Packs/day: 1.00    Types: Cigarettes  . Smokeless tobacco: Never Used  Substance and Sexual Activity  . Alcohol use: Yes   . Drug use: Not Currently    Comment: Pt denies substance use during this TTS assessment on 08-25-14 UDS + for THC  . Sexual activity: Not Currently  Lifestyle  . Physical activity:    Days per week: Not on file    Minutes per session: Not on file  . Stress: Not on file  Relationships  . Social connections:    Talks on phone: Not on file    Gets together: Not on file    Attends religious service: Not on file    Active member of club or organization: Not on file    Attends meetings of clubs or organizations: Not on file    Relationship status: Not on file  . Intimate partner violence:    Fear of current or ex partner: Not on file    Emotionally abused: Not on file    Physically abused: Not on file    Forced sexual activity: Not on file  Other Topics Concern  . Not on file  Social History Narrative  . Not on file     No Known Allergies   Outpatient Medications Prior to Visit  Medication Sig Dispense Refill  . naproxen (NAPROSYN) 500 MG tablet Take 1 tablet (500 mg total) by mouth 2 (two) times daily as needed for mild pain or moderate pain. 14 tablet 0  . traMADol (ULTRAM) 50 MG tablet Take 1 tablet (50 mg total) by mouth every 6 (six) hours as needed for moderate pain or severe pain (breakthrough pain). 15 tablet 0  . feeding supplement, ENSURE ENLIVE, (ENSURE ENLIVE) LIQD Take 237 mLs by mouth 2 (two) times daily between meals. (Patient not taking: Reported on 07/22/2017) 884 mL 12  . folic acid (FOLVITE) 1 MG tablet Take 1 tablet (1 mg total) by mouth daily. (Patient not taking: Reported on 07/22/2017) 30 tablet 0  . thiamine 100 MG tablet Take 1 tablet (100 mg total) by mouth daily. (Patient not taking: Reported on 08/22/2016) 30 tablet 0   No facility-administered medications prior to visit.      Review of Systems  Constitutional: Positive for diaphoresis and fatigue. Negative for activity change, appetite change, chills and fever.  HENT: Negative.   Eyes: Negative.    Respiratory: Negative.   Cardiovascular: Negative.   Gastrointestinal: Positive for abdominal pain. Negative for anal bleeding, blood in stool, constipation, diarrhea, nausea, rectal pain and vomiting.  Endocrine: Negative.   Genitourinary: Positive for dysuria, flank pain, frequency and urgency. Negative for decreased urine volume, difficulty urinating, dyspareunia, enuresis, genital sores, hematuria, menstrual problem, pelvic pain, vaginal bleeding, vaginal discharge and vaginal pain.  Musculoskeletal: Positive for back pain.  Negative for arthralgias, gait problem and joint swelling.  Skin: Negative.   Neurological: Negative.   Hematological: Negative.   Psychiatric/Behavioral: Negative.        Objective:   Physical Exam Gen: Pleasant, well-nourished, in no distress,  normal affect  ENT: No lesions,  mouth clear,  oropharynx clear, no postnasal drip  Neck: No JVD, no TMG, no carotid bruits  Lungs: No use of accessory muscles, no dullness to percussion, clear without rales or rhonchi  Cardiovascular: RRR, heart sounds normal, no murmur or gallops, no peripheral edema  Abdomen: soft tender left flank.  No HSM,  BS normal Left nephrostomy tube is sutured in place with an anchor dressing.  There is no drainage from the nephrostomy tube site.  Musculoskeletal: No deformities, no cyanosis or clubbing  Neuro: alert, non focal  Skin: Warm, no lesions or rashes  BMP Latest Ref Rng & Units 11/21/2018 11/20/2018 11/19/2018  Glucose 70 - 99 mg/dL 87 100(H) 110(H)  BUN 6 - 20 mg/dL 8 12 8   Creatinine 0.44 - 1.00 mg/dL 0.71 0.80 0.87  Sodium 135 - 145 mmol/L 136 135 133(L)  Potassium 3.5 - 5.1 mmol/L 4.0 4.4 4.2  Chloride 98 - 111 mmol/L 100 102 101  CO2 22 - 32 mmol/L 26 23 22   Calcium 8.9 - 10.3 mg/dL 8.9 8.9 9.1   CBC Latest Ref Rng & Units 11/21/2018 11/20/2018 11/19/2018  WBC 4.0 - 10.5 K/uL 7.6 9.0 12.3(H)  Hemoglobin 12.0 - 15.0 g/dL 12.4 12.1 12.9  Hematocrit 36.0 - 46.0  % 38.0 37.0 39.7  Platelets 150 - 400 K/uL 350 328 333   Lab Results  Component Value Date   ALT 26 11/17/2018   AST 30 11/17/2018   ALKPHOS 83 11/17/2018   BILITOT 0.4 11/17/2018   12/24 Renal CT stone study FINDINGS: Lower chest: No acute findings.  Hepatobiliary: No mass visualized on this unenhanced exam. Gallbladder is unremarkable.  Pancreas: No mass or inflammatory process visualized on this unenhanced exam.  Spleen:  Within normal limits in size.  Adrenals/Urinary tract: Adrenal glands and right kidney are unremarkable. Severe left hydronephrosis shows no significant change. A left ureteral stent is now seen in appropriate position, however there is calcification seen surrounding the proximal and distal pigtail loops, raising suspicion for stent occlusion. Two new calculi are seen in the lower pole collecting system of the left kidney, largest measuring 9 mm.  Stomach/Bowel: No evidence of obstruction, inflammatory process, or abnormal fluid collections.  Vascular/Lymphatic: No pathologically enlarged lymph nodes identified. No evidence of abdominal aortic aneurysm. Aortic atherosclerosis.  Reproductive: Partially calcified posterior uterine fibroid again seen measuring 3.2 cm. Adnexal regions are unremarkable.  Other:  None.  Musculoskeletal:  No suspicious bone lesions identified.  IMPRESSION: Stable severe left hydronephrosis despite left ureteral stent in appropriate position. Calcification is seen surrounding the proximal and distal pigtail loops, suspicious for stent occlusion.  Nonobstructing calculi in left lower pole renal collecting system, largest measuring 9 mm.  Stable small calcified uterine fibroid.        Assessment & Plan:  I personally reviewed all images and lab data in the [facility] system as well as any outside material available during this office visit and agree with the  radiology impressions.   Pyelonephritis Left  pyelonephritis secondary to methicillin sensitive staph aureus.  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  I suspect the stent is obstructed.  The nephrostomy tube  in place is working at this time.  The patient is having increased pain and dysuria.  The patient has a pending urology appointment.  Plan The patient is advised to follow-up with urology to have the nephrostomy tube managed and possibly have the chronic left ureteral stent removed  Insurance coverage has been a barrier in the past because the patient cannot afford the procedure co-pay.  We are in the process of obtaining for the patient in orange card which will hopefully help with insurance coverage.    Hydronephrosis Review pyelonephritis assessment  Nephrostomy dressing was changed and dressing supplies given to the patient  Hypokalemia Hypokalemia during recent hospitalization  Will plan follow-up basic metabolic profile and CBC at this visit   [place] was seen today for hospitalization follow-up.  Diagnoses and all orders for this visit:  Pyelonephritis -     CBC with Differential/Platelet; Future -     POCT URINALYSIS DIP (CLINITEK) -     Urine Culture -     CBC with Differential/Platelet  Hydronephrosis with renal and ureteral calculus obstruction -     POCT URINALYSIS DIP (CLINITEK) -     Urine Culture  Hypokalemia -     Basic Metabolic Panel -     CBC with Differential/Platelet; Future -     CBC with Differential/Platelet  Bipolar 1 disorder (HCC)  Other orders -     traMADol (ULTRAM) 50 MG tablet; Take 1 tablet (50 mg total) by mouth every 6 (six) hours as needed for moderate pain or severe pain (breakthrough pain). -     naproxen (NAPROSYN) 500 MG tablet; Take 1 tablet (500 mg total) by mouth 2 (two) times daily as needed for mild pain or moderate pain. -     cephALEXin (KEFLEX) 500 MG capsule; Take 1  capsule (500 mg total) by mouth 4 (four) times daily.

## 2018-12-08 NOTE — Assessment & Plan Note (Addendum)
Review pyelonephritis assessment  Nephrostomy dressing was changed and dressing supplies given to the patient  Refills on tramadol, Keflex, and Naprosyn were given to the patient

## 2018-12-08 NOTE — Assessment & Plan Note (Signed)
Left pyelonephritis secondary to methicillin sensitive staph aureus.  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  I suspect the stent is obstructed.  The nephrostomy tube in place is working at this time.  The patient is having increased pain and dysuria.  The patient has a pending urology appointment.  Plan The patient is advised to follow-up with urology to have the nephrostomy tube managed and possibly have the chronic left ureteral stent removed  Insurance coverage has been a barrier in the past because the patient cannot afford the procedure co-pay.  We are in the process of obtaining for the patient in orange card which will hopefully help with insurance coverage.

## 2018-12-08 NOTE — Patient Instructions (Signed)
Refills on Naprosyn and Keflex were sent to our pharmacy Refills on tramadol was sent to the [facility] on [address] your appointment with urology on January 23  Return for follow-up with Dr. Joya Gaskins in 2 weeks  Keep your appointment with financial counseling to obtain an orange card and financial assistance  Labs today will include a basic metabolic panel and CBC urine culture and urinalysis

## 2018-12-09 LAB — CBC WITH DIFFERENTIAL/PLATELET
Basophils Absolute: 0.1 10*3/uL (ref 0.0–0.2)
Basos: 1 %
EOS (ABSOLUTE): 0.5 10*3/uL — ABNORMAL HIGH (ref 0.0–0.4)
Eos: 5 %
Hematocrit: 34.7 % (ref 34.0–46.6)
Hemoglobin: 11.6 g/dL (ref 11.1–15.9)
Immature Grans (Abs): 0 10*3/uL (ref 0.0–0.1)
Immature Granulocytes: 0 %
Lymphocytes Absolute: 2.5 10*3/uL (ref 0.7–3.1)
Lymphs: 26 %
MCH: 33.6 pg — ABNORMAL HIGH (ref 26.6–33.0)
MCHC: 33.4 g/dL (ref 31.5–35.7)
MCV: 101 fL — ABNORMAL HIGH (ref 79–97)
Monocytes Absolute: 1.1 10*3/uL — ABNORMAL HIGH (ref 0.1–0.9)
Monocytes: 11 %
Neutrophils Absolute: 5.3 10*3/uL (ref 1.4–7.0)
Neutrophils: 57 %
Platelets: 447 10*3/uL (ref 150–450)
RBC: 3.45 x10E6/uL — ABNORMAL LOW (ref 3.77–5.28)
RDW: 13.4 % (ref 11.7–15.4)
WBC: 9.4 10*3/uL (ref 3.4–10.8)

## 2018-12-09 LAB — BASIC METABOLIC PANEL
BUN/Creatinine Ratio: 16 (ref 9–23)
BUN: 10 mg/dL (ref 6–24)
CO2: 21 mmol/L (ref 20–29)
Calcium: 9.3 mg/dL (ref 8.7–10.2)
Chloride: 104 mmol/L (ref 96–106)
Creatinine, Ser: 0.63 mg/dL (ref 0.57–1.00)
GFR calc Af Amer: 117 mL/min/{1.73_m2} (ref >59)
GFR calc non Af Amer: 101 mL/min/{1.73_m2} (ref >59)
Glucose: 77 mg/dL (ref 65–99)
Potassium: 4.6 mmol/L (ref 3.5–5.2)
Sodium: 142 mmol/L (ref 134–144)

## 2018-12-11 ENCOUNTER — Ambulatory Visit: Payer: Self-pay | Attending: Family Medicine

## 2018-12-13 LAB — URINE CULTURE

## 2018-12-14 ENCOUNTER — Other Ambulatory Visit: Payer: Self-pay | Admitting: Critical Care Medicine

## 2018-12-14 MED ORDER — CIPROFLOXACIN HCL 500 MG PO TABS: 500.0000 mg | ORAL_TABLET | Freq: Two times a day (BID) | ORAL | 0 refills | Status: DC

## 2018-12-14 MED FILL — CIPROFLOXACIN HCL 500 MG TA: 500 | 10 days supply | Qty: 20 | Fill #0

## 2018-12-14 NOTE — Progress Notes (Signed)
Urine Culture shows :  Pseudomonas and MRSA  Will change to cipro 500mg  BID x 10days and d/c Keflex  Asencion Noble

## 2018-12-17 ENCOUNTER — Other Ambulatory Visit: Payer: Self-pay | Admitting: Interventional Radiology

## 2018-12-17 ENCOUNTER — Other Ambulatory Visit: Payer: Self-pay | Admitting: Urology

## 2018-12-17 DIAGNOSIS — T83098A Other mechanical complication of other indwelling urethral catheter, initial encounter: Secondary | ICD-10-CM

## 2018-12-18 ENCOUNTER — Ambulatory Visit (HOSPITAL_COMMUNITY)
Admission: RE | Admit: 2018-12-18 | Discharge: 2018-12-18 | Disposition: A | Discharge status: Home / Self Care | Payer: Self-pay | Source: Ambulatory Visit | Attending: Urology | Admitting: Urology

## 2018-12-18 ENCOUNTER — Encounter (HOSPITAL_COMMUNITY): Payer: Self-pay | Admitting: Interventional Radiology

## 2018-12-18 DIAGNOSIS — T83098A Other mechanical complication of other indwelling urethral catheter, initial encounter: Secondary | ICD-10-CM

## 2018-12-18 DIAGNOSIS — T83092A Other mechanical complication of nephrostomy catheter, initial encounter: Secondary | ICD-10-CM | POA: Insufficient documentation

## 2018-12-18 DIAGNOSIS — Y839 Surgical procedure, unspecified as the cause of abnormal reaction of the patient, or of later complication, without mention of misadventure at the time of the procedure: Secondary | ICD-10-CM | POA: Insufficient documentation

## 2018-12-18 HISTORY — PX: IR NEPHROSTOMY TUBE CHANGE: IMG1442

## 2018-12-18 MED ORDER — IOPAMIDOL (ISOVUE-300) INJECTION 61%
INTRAVENOUS | Status: AC
  Administered 2018-12-18: 5 mL
  Filled 2018-12-18: qty 50

## 2018-12-18 MED ORDER — IOPAMIDOL (ISOVUE-300) INJECTION 61%
50.0000 mL | Freq: Once | INTRAVENOUS | Status: AC | PRN
  Administered 2018-12-18: 5 mL

## 2018-12-18 MED ORDER — LIDOCAINE HCL 1 % IJ SOLN
INTRAMUSCULAR | Status: DC | PRN
  Administered 2018-12-18: 10 mL via INTRADERMAL

## 2018-12-18 MED ORDER — LIDOCAINE HCL 1 % IJ SOLN
INTRAMUSCULAR | Status: AC
  Filled 2018-12-18: qty 20

## 2018-12-23 ENCOUNTER — Other Ambulatory Visit: Payer: Self-pay

## 2018-12-23 ENCOUNTER — Encounter: Payer: Self-pay | Admitting: Critical Care Medicine

## 2018-12-23 ENCOUNTER — Ambulatory Visit: Payer: Self-pay | Attending: Critical Care Medicine | Admitting: Critical Care Medicine

## 2018-12-23 VITALS — BP 153/89 | HR 88 | Temp 98.9°F | Resp 16 | Ht 67.0 in | Wt 138.2 lb

## 2018-12-23 DIAGNOSIS — E876 Hypokalemia: Secondary | ICD-10-CM

## 2018-12-23 DIAGNOSIS — N136 Pyonephrosis: Secondary | ICD-10-CM | POA: Insufficient documentation

## 2018-12-23 DIAGNOSIS — N12 Tubulo-interstitial nephritis, not specified as acute or chronic: Secondary | ICD-10-CM

## 2018-12-23 DIAGNOSIS — F17210 Nicotine dependence, cigarettes, uncomplicated: Secondary | ICD-10-CM | POA: Insufficient documentation

## 2018-12-23 DIAGNOSIS — Z936 Other artificial openings of urinary tract status: Secondary | ICD-10-CM | POA: Insufficient documentation

## 2018-12-23 DIAGNOSIS — D259 Leiomyoma of uterus, unspecified: Secondary | ICD-10-CM | POA: Insufficient documentation

## 2018-12-23 DIAGNOSIS — Z79899 Other long term (current) drug therapy: Secondary | ICD-10-CM | POA: Insufficient documentation

## 2018-12-23 DIAGNOSIS — N132 Hydronephrosis with renal and ureteral calculous obstruction: Secondary | ICD-10-CM

## 2018-12-23 DIAGNOSIS — A4902 Methicillin resistant Staphylococcus aureus infection, unspecified site: Secondary | ICD-10-CM | POA: Insufficient documentation

## 2018-12-23 MED ORDER — ONDANSETRON HCL 4 MG PO TABS: 4.0000 mg | ORAL_TABLET | Freq: Three times a day (TID) | ORAL | 0 refills | Status: DC | PRN

## 2018-12-23 MED ORDER — CIPROFLOXACIN HCL 500 MG PO TABS: 500.0000 mg | ORAL_TABLET | Freq: Two times a day (BID) | ORAL | 1 refills | Status: AC

## 2018-12-23 MED ORDER — FOLIC ACID 1 MG PO TABS: 1.0000 mg | ORAL_TABLET | Freq: Every day | ORAL | 0 refills | Status: DC

## 2018-12-23 MED ORDER — NAPROXEN 500 MG PO TABS: 500.0000 mg | ORAL_TABLET | Freq: Two times a day (BID) | ORAL | 1 refills | Status: DC | PRN

## 2018-12-23 MED ORDER — TRAMADOL HCL 50 MG PO TABS: 50.0000 mg | ORAL_TABLET | Freq: Four times a day (QID) | ORAL | 0 refills | Status: DC | PRN

## 2018-12-23 MED ORDER — CIPROFLOXACIN HCL 500 MG PO TABS: 500.0000 mg | ORAL_TABLET | Freq: Two times a day (BID) | ORAL | 1 refills | Status: DC

## 2018-12-23 MED ORDER — THIAMINE HCL 100 MG PO TABS: 100.0000 mg | ORAL_TABLET | Freq: Every day | ORAL | 0 refills | Status: DC

## 2018-12-23 MED FILL — ONDANSETRON HCL 4 MG TABLET: 4 | 6 days supply | Qty: 20 | Fill #0

## 2018-12-23 MED FILL — FOLIC ACID 1 MG TABS: 1 | 30 days supply | Qty: 30 | Fill #0

## 2018-12-23 MED FILL — CIPROFLOXACIN HCL 500 MG TA: 500 | 7 days supply | Qty: 14 | Fill #0

## 2018-12-23 MED FILL — traMADol HCL 50 MG TABS: 50 | 7 days supply | Qty: 30 | Fill #0

## 2018-12-23 NOTE — Progress Notes (Signed)
Subjective:    Patient ID: Kimberly [name], female    DOB: [birth year], 58 y.o.   MRN: [id number]  This is a 58 year old woman who is seen in post hospital follow-up.  The patient was admitted on 11/17/2018 for left-sided abdominal pain flank pain and dysuria found to have severe pyelonephritis and left-sided hydronephrosis with a calcified ureteral stent and multiple renal calculi.  Note the patient had been seen in 2018 for renal stone disease in the left kidney and had had a ureteral stent placed by urology.  However the patient did not have adequate insurance to follow-up and have the stent removed.  The stent stayed in place over this time.  During the current hospitalization cultures grew multi-sensitive staph aureus from the urine.  Urology recommended transitioning to oral antibiotics and to obtain outpatient follow-up.  Interventional radiology placed the nephrostomy tube but urology said they would manage the drain as an outpatient.  The patient has yet to receive the urology appointment.  According to our records it is scheduled on December 17, 2018.  The patient is changing the dressing on the nephrostomy tube once a week and has run out of dressing supplies.  She is run out of pain medicine as well and is having quite significant pain in the left flank area that has been relieved with the tramadol.  She still has burning and dysuria.  She also has discolored urine coming out of the nephrostomy tube and was stone light material.  The patient denies any fever.  There is no nausea or vomiting.  The patient seen here today in follow-up.  Note the patient has a prior history of homelessness.  She now has an apartment she is living in.  She had a job at [employer] but because of the nephrostomy tube she cannot work at this time.  She has no insurance.   Significant Procedures during hospital stay 11/18/2018 - left nephrostomy  12/23/2018 Pt last seen 12/08/18:  Urine cult grew  pseudomonas and MRSA   Keflex d/c and started cipro po.  Pt was to get into urology for stent removal /assessment. Pt did take  The cipro we rec.  Still with nausea emesis, diarrhea, chills.  Severe pain.  No fever. Pain in bladder and in tube.   When urinates hurts.  Suprapubic pain.  Surgery will be in two weeks on the kidney    Past Medical History:  Diagnosis Date  . Anxiety   . Bipolar 1 disorder ([place])   . Depression   . Hepatitis C      History reviewed. No pertinent family history.   Social History   Socioeconomic History  . Marital status: Single    Spouse name: Not on file  . Number of children: Not on file  . Years of education: Not on file  . Highest education level: Not on file  Occupational History  . Not on file  Social Needs  . Financial resource strain: Not on file  . Food insecurity:    Worry: Not on file    Inability: Not on file  . Transportation needs:    Medical: Not on file    Non-medical: Not on file  Tobacco Use  . Smoking status: Current Every Day Smoker    Packs/day: 1.00    Types: Cigarettes  . Smokeless tobacco: Never Used  Substance and Sexual Activity  . Alcohol use: Yes  . Drug use: Not Currently    Comment: Pt denies substance  use during this TTS assessment on 08-25-14 UDS + for THC  . Sexual activity: Not Currently  Lifestyle  . Physical activity:    Days per week: Not on file    Minutes per session: Not on file  . Stress: Not on file  Relationships  . Social connections:    Talks on phone: Not on file    Gets together: Not on file    Attends religious service: Not on file    Active member of club or organization: Not on file    Attends meetings of clubs or organizations: Not on file    Relationship status: Not on file  . Intimate partner violence:    Fear of current or ex partner: Not on file    Emotionally abused: Not on file    Physically abused: Not on file    Forced sexual activity: Not on file  Other Topics  Concern  . Not on file  Social History Narrative  . Not on file     No Known Allergies   Outpatient Medications Prior to Visit  Medication Sig Dispense Refill  . feeding supplement, ENSURE ENLIVE, (ENSURE ENLIVE) LIQD Take 237 mLs by mouth 2 (two) times daily between meals. 237 mL 12  . ciprofloxacin (CIPRO) 500 MG tablet Take 1 tablet (500 mg total) by mouth 2 (two) times daily. (Patient not taking: Reported on 12/23/2018) 20 tablet 0  . folic acid (FOLVITE) 1 MG tablet Take 1 tablet (1 mg total) by mouth daily. (Patient not taking: Reported on 07/22/2017) 30 tablet 0  . naproxen (NAPROSYN) 500 MG tablet Take 1 tablet (500 mg total) by mouth 2 (two) times daily as needed for mild pain or moderate pain. (Patient not taking: Reported on 12/23/2018) 60 tablet 1  . thiamine 100 MG tablet Take 1 tablet (100 mg total) by mouth daily. (Patient not taking: Reported on 08/22/2016) 30 tablet 0  . traMADol (ULTRAM) 50 MG tablet Take 1 tablet (50 mg total) by mouth every 6 (six) hours as needed for moderate pain or severe pain (breakthrough pain). (Patient not taking: Reported on 12/23/2018) 20 tablet 0   No facility-administered medications prior to visit.      Review of Systems  Constitutional: Positive for diaphoresis and fatigue. Negative for activity change, appetite change, chills and fever.  HENT: Negative.   Eyes: Negative.   Respiratory: Negative.   Cardiovascular: Negative.   Gastrointestinal: Positive for abdominal pain. Negative for anal bleeding, blood in stool, constipation, diarrhea, nausea, rectal pain and vomiting.  Endocrine: Negative.   Genitourinary: Positive for dysuria, flank pain, frequency and urgency. Negative for decreased urine volume, difficulty urinating, dyspareunia, enuresis, genital sores, hematuria, menstrual problem, pelvic pain, vaginal bleeding, vaginal discharge and vaginal pain.  Musculoskeletal: Positive for back pain. Negative for arthralgias, gait problem and  joint swelling.  Skin: Negative.   Neurological: Negative.   Hematological: Negative.   Psychiatric/Behavioral: Negative.        Objective:   Physical Exam  Gen: Pleasant, well-nourished, in no distress,  normal affect  ENT: No lesions,  mouth clear,  oropharynx clear, no postnasal drip  Neck: No JVD, no TMG, no carotid bruits  Lungs: No use of accessory muscles, no dullness to percussion, clear without rales or rhonchi  Cardiovascular: RRR, heart sounds normal, no murmur or gallops, no peripheral edema  Abdomen: soft tender left flank.  No HSM,  BS normal Left nephrostomy tube is sutured in place with an anchor dressing.  There is  no drainage from the nephrostomy tube site.  Musculoskeletal: No deformities, no cyanosis or clubbing  Neuro: alert, non focal  Skin: Warm, no lesions or rashes UA:  Amber, clear, Mod blood, small leukocytes BMP Latest Ref Rng & Units 12/08/2018 11/21/2018 11/20/2018  Glucose 65 - 99 mg/dL 77 87 100(H)  BUN 6 - 24 mg/dL 10 8 12   Creatinine 0.57 - 1.00 mg/dL 0.63 0.71 0.80  BUN/Creat Ratio 9 - 23 16 - -  Sodium 134 - 144 mmol/L 142 136 135  Potassium 3.5 - 5.2 mmol/L 4.6 4.0 4.4  Chloride 96 - 106 mmol/L 104 100 102  CO2 20 - 29 mmol/L 21 26 23   Calcium 8.7 - 10.2 mg/dL 9.3 8.9 8.9   CBC Latest Ref Rng & Units 12/08/2018 11/21/2018 11/20/2018  WBC 3.4 - 10.8 x10E3/uL 9.4 7.6 9.0  Hemoglobin 11.1 - 15.9 g/dL 11.6 12.4 12.1  Hematocrit 34.0 - 46.6 % 34.7 38.0 37.0  Platelets 150 - 450 x10E3/uL 447 350 328   Lab Results  Component Value Date   ALT 26 11/17/2018   AST 30 11/17/2018   ALKPHOS 83 11/17/2018   BILITOT 0.4 11/17/2018   12/24 Renal CT stone study FINDINGS: Lower chest: No acute findings.  Hepatobiliary: No mass visualized on this unenhanced exam. Gallbladder is unremarkable.  Pancreas: No mass or inflammatory process visualized on this unenhanced exam.  Spleen:  Within normal limits in size.  Adrenals/Urinary  tract: Adrenal glands and right kidney are unremarkable. Severe left hydronephrosis shows no significant change. A left ureteral stent is now seen in appropriate position, however there is calcification seen surrounding the proximal and distal pigtail loops, raising suspicion for stent occlusion. Two new calculi are seen in the lower pole collecting system of the left kidney, largest measuring 9 mm.  Stomach/Bowel: No evidence of obstruction, inflammatory process, or abnormal fluid collections.  Vascular/Lymphatic: No pathologically enlarged lymph nodes identified. No evidence of abdominal aortic aneurysm. Aortic atherosclerosis.  Reproductive: Partially calcified posterior uterine fibroid again seen measuring 3.2 cm. Adnexal regions are unremarkable.  Other:  None.  Musculoskeletal:  No suspicious bone lesions identified.  IMPRESSION: Stable severe left hydronephrosis despite left ureteral stent in appropriate position. Calcification is seen surrounding the proximal and distal pigtail loops, suspicious for stent occlusion.  Nonobstructing calculi in left lower pole renal collecting system, largest measuring 9 mm.  Stable small calcified uterine fibroid.  12/18/18 Nephrostomy tube exchange INDICATION: Status post left percutaneous nephrostomy tube placement on 11/18/2018 to treat hydronephrosis secondary to ureteral calculus and obstructed ureteral stent. The nephrostomy tube has retracted at the skin exit site and is no longer draining urine.  EXAM: EXCHANGE OF LEFT PERCUTANEOUS NEPHROSTOMY TUBE UNDER FLUOROSCOPY  COMPARISON:  11/18/2018  MEDICATIONS: None  ANESTHESIA/SEDATION: None  CONTRAST:  10 mL Isovue-300-administered into the collecting system(s)  FLUOROSCOPY TIME:  Fluoroscopy Time: 1.0 minute.  6.8 mGy.  COMPLICATIONS: None immediate.  PROCEDURE: Informed written consent was obtained from the patient after a thorough discussion of  the procedural risks, benefits and alternatives. All questions were addressed. Maximal Sterile Barrier Technique was utilized including caps, mask, sterile gowns, sterile gloves, sterile drape, hand hygiene and skin antiseptic. A timeout was performed prior to the initiation of the procedure.  The pre-existing nephrostomy tube was injected with contrast material. The catheter was cut and removed over a guidewire. The guidewire was further advanced with assistance of a 5 French catheter.  A new 10 French nephrostomy tube was advanced under fluoroscopy. The catheter  was formed at the level of the left renal pelvis. The catheter was injected with contrast material and a fluoroscopic spot image obtained. The catheter was secured at the skin with a Prolene retention suture and StatLock device. It was attached to a new gravity drainage bag.  FINDINGS: The pre-existing nephrostomy tube was retracted and outside of the collecting system. With injection of the catheter, there was ability to opacify the collecting system and a guidewire was able to be advanced into the collecting system via interpolar access. The new nephrostomy tube was formed in the renal pelvis and is draining well after placement.  IMPRESSION: Replacement of retracted and malpositioned left nephrostomy tube. The old nephrostomy tube was retracted outside of the collecting system. A new 10 French nephrostomy tube was able to be advanced over a guidewire and formed in the left renal pelvis.       Assessment & Plan:  I personally reviewed all images and lab data in the [facility] system as well as any outside material available during this office visit and agree with the  radiology impressions.   Pyelonephritis Left-sided hydronephrosis with pyelonephritis and now likely cystitis Pseudomonas and methicillin-resistant staph aureus have been identified from previous urine culture  Creatinine is now normal  Plan We  will obtain another urine culture and send Cipro 500 mg twice daily for 7 days and the patient will go off this antibiotic and resume 1 week prior to the next planned surgery Zofran as needed for nausea Refill on Naprosyn and tramadol for pain control Note new nephrostomy tube placed on left Plans for surgical removal of the ureteral stent in 2 weeks has been scheduled per urology Return here in 1 month  Hydronephrosis Left nephrostomy tube had to be replaced per interventional radiology on 12/18/2018  Plans per urology  Hypokalemia Hypokalemia has resolved   Kimberly [name] was seen today for follow-up.  Diagnoses and all orders for this visit:  Pyelonephritis -     Urine Culture  Hydronephrosis with renal and ureteral calculus obstruction  Hypokalemia  Other orders -     traMADol (ULTRAM) 50 MG tablet; Take 1 tablet (50 mg total) by mouth every 6 (six) hours as needed for moderate pain or severe pain (breakthrough pain). -     thiamine 100 MG tablet; Take 1 tablet (100 mg total) by mouth daily. -     naproxen (NAPROSYN) 500 MG tablet; Take 1 tablet (500 mg total) by mouth 2 (two) times daily as needed for mild pain or moderate pain. -     folic acid (FOLVITE) 1 MG tablet; Take 1 tablet (1 mg total) by mouth daily. -     Discontinue: ciprofloxacin (CIPRO) 500 MG tablet; Take 1 tablet (500 mg total) by mouth 2 (two) times daily for 7 days. After first 7 days of therapy Refill and take again one week prior to scheduled surgery -     ondansetron (ZOFRAN) 4 MG tablet; Take 1 tablet (4 mg total) by mouth every 8 (eight) hours as needed for nausea or vomiting. -     Discontinue: ciprofloxacin (CIPRO) 500 MG tablet; Take 1 tablet (500 mg total) by mouth 2 (two) times daily for 7 days. After first 7 days of therapy Refill and take again one week prior to scheduled surgery -     ciprofloxacin (CIPRO) 500 MG tablet; Take 1 tablet (500 mg total) by mouth 2 (two) times daily for 7 days.

## 2018-12-23 NOTE — Progress Notes (Signed)
Unable to eat d/t N/V Sx's x 2 weeks No appetite  No falls but dizzy alot

## 2018-12-23 NOTE — Patient Instructions (Signed)
A urine culture was obtained Resume Cipro 1 twice daily for 7 days then stop until 1 week prior to your schedule surgery and take another 7 days of Cipro 1 twice daily  Zofran 4 mg every 8 hours as needed was given for nausea  Refills on Naprosyn, tramadol and your vitamins were obtained  Keep your appointments with urology

## 2018-12-23 NOTE — Assessment & Plan Note (Addendum)
Hypokalemia has resolved

## 2018-12-23 NOTE — Assessment & Plan Note (Addendum)
Left-sided hydronephrosis with pyelonephritis and now likely cystitis Pseudomonas and methicillin-resistant staph aureus have been identified from previous urine culture  Creatinine is now normal  Plan We will obtain another urine culture and send Cipro 500 mg twice daily for 7 days and the patient will go off this antibiotic and resume 1 week prior to the next planned surgery Zofran as needed for nausea Refill on Naprosyn and tramadol for pain control Note new nephrostomy tube placed on left Plans for surgical removal of the ureteral stent in 2 weeks has been scheduled per urology Return here in 1 month

## 2018-12-23 NOTE — Assessment & Plan Note (Signed)
Left nephrostomy tube had to be replaced per interventional radiology on 12/18/2018  Plans per urology

## 2018-12-24 ENCOUNTER — Other Ambulatory Visit: Payer: Self-pay | Admitting: Urology

## 2018-12-25 LAB — URINE CULTURE

## 2018-12-28 ENCOUNTER — Telehealth: Payer: Self-pay | Admitting: *Deleted

## 2018-12-28 NOTE — Telephone Encounter (Signed)
-----   Message from Elsie Stain, MD sent at 12/28/2018 10:23 AM EST ----- [place],  Let the pt know her urine culture was negative, stay on current antibiotic until completed

## 2018-12-28 NOTE — Telephone Encounter (Signed)
Patient verified DOB Patient is aware of no further antibiotics being needed.

## 2019-01-05 MED FILL — CIPROFLOXACIN HCL 500 MG TA: 500 | 7 days supply | Qty: 14 | Fill #1

## 2019-01-11 ENCOUNTER — Telehealth: Payer: Self-pay | Admitting: General Practice

## 2019-01-11 MED ORDER — TRAMADOL HCL 50 MG PO TABS: 50.0000 mg | ORAL_TABLET | Freq: Four times a day (QID) | ORAL | 0 refills | Status: DC | PRN

## 2019-01-11 MED FILL — traMADol HCL 50 MG TABS: 50 | 8 days supply | Qty: 30 | Fill #0

## 2019-01-11 NOTE — Telephone Encounter (Signed)
I sent refills to our pharmacy.

## 2019-01-11 NOTE — Telephone Encounter (Signed)
1) Medication(s) Requested (by name): tramadol 2) Pharmacy of Choice:  [facility]

## 2019-01-12 NOTE — Telephone Encounter (Signed)
patient called about refill it was sent to [facility]

## 2019-01-13 NOTE — Patient Instructions (Addendum)
Kimberly [name]  01/13/2019   Your procedure is scheduled on: 01-20-19    Report to [facility] Main  Entrance    Report to Admitting at 7:44 AM    Call this number if you have problems the morning of surgery [phone]    Remember: Do not eat food or drink liquids :After Midnight.    BRUSH YOUR TEETH MORNING OF SURGERY AND RINSE YOUR MOUTH OUT, NO CHEWING GUM CANDY OR MINTS.     Take these medicines the morning of surgery with A SIP OF WATER: None                                You may not have any metal on your body including hair pins and              piercings  Do not wear jewelry, make-up, lotions, powders or perfumes, deodorant             Do not wear nail polish.  Do not shave  48 hours prior to surgery.                Do not bring valuables to the hospital. [place].  Contacts, dentures or bridgework may not be worn into surgery.  Leave suitcase in the car. After surgery it may be brought to your room.     Patients discharged the day of surgery will not be allowed to drive home. IF YOU ARE HAVING SURGERY AND GOING HOME THE SAME DAY, YOU MUST HAVE AN ADULT TO DRIVE YOU HOME AND BE WITH YOU FOR 24 HOURS. YOU MAY GO HOME BY TAXI OR UBER OR ORTHERWISE, BUT AN ADULT MUST ACCOMPANY YOU HOME AND STAY WITH YOU FOR 24 HOURS.    Special Instructions: N/A              Please read over the following fact sheets you were given: _____________________________________________________________________             [facility] - Preparing for Surgery Before surgery, you can play an important role.  Because skin is not sterile, your skin needs to be as free of germs as possible.  You can reduce the number of germs on your skin by washing with CHG (chlorahexidine gluconate) soap before surgery.  CHG is an antiseptic cleaner which kills germs and bonds with the skin to continue killing germs even after  washing. Please DO NOT use if you have an allergy to CHG or antibacterial soaps.  If your skin becomes reddened/irritated stop using the CHG and inform your nurse when you arrive at Short Stay. Do not shave (including legs and underarms) for at least 48 hours prior to the first CHG shower.  You may shave your face/neck. Please follow these instructions carefully:  1.  Shower with CHG Soap the night before surgery and the  morning of Surgery.  2.  If you choose to wash your hair, wash your hair first as usual with your  normal  shampoo.  3.  After you shampoo, rinse your hair and body thoroughly to remove the  shampoo.  4.  Use CHG as you would any other liquid soap.  You can apply chg directly  to the skin and wash                       Gently with a scrungie or clean washcloth.  5.  Apply the CHG Soap to your body ONLY FROM THE NECK DOWN.   Do not use on face/ open                           Wound or open sores. Avoid contact with eyes, ears mouth and genitals (private parts).                       Wash face,  Genitals (private parts) with your normal soap.             6.  Wash thoroughly, paying special attention to the area where your surgery  will be performed.  7.  Thoroughly rinse your body with warm water from the neck down.  8.  DO NOT shower/wash with your normal soap after using and rinsing off  the CHG Soap.                9.  Pat yourself dry with a clean towel.            10.  Wear clean pajamas.            11.  Place clean sheets on your bed the night of your first shower and do not  sleep with pets. Day of Surgery : Do not apply any lotions/deodorants the morning of surgery.  Please wear clean clothes to the hospital/surgery center.  FAILURE TO FOLLOW THESE INSTRUCTIONS MAY RESULT IN THE CANCELLATION OF YOUR SURGERY PATIENT SIGNATURE_________________________________  NURSE  SIGNATURE__________________________________  ________________________________________________________________________   Adam Phenix  An incentive spirometer is a tool that can help keep your lungs clear and active. This tool measures how well you are filling your lungs with each breath. Taking long deep breaths may help reverse or decrease the chance of developing breathing (pulmonary) problems (especially infection) following:  A long period of time when you are unable to move or be active. BEFORE THE PROCEDURE   If the spirometer includes an indicator to show your best effort, your nurse or respiratory therapist will set it to a desired goal.  If possible, sit up straight or lean slightly forward. Try not to slouch.  Hold the incentive spirometer in an upright position. INSTRUCTIONS FOR USE  1. Sit on the edge of your bed if possible, or sit up as far as you can in bed or on a chair. 2. Hold the incentive spirometer in an upright position. 3. Breathe out normally. 4. Place the mouthpiece in your mouth and seal your lips tightly around it. 5. Breathe in slowly and as deeply as possible, raising the piston or the ball toward the top of the column. 6. Hold your breath for 3-5 seconds or for as long as possible. Allow the piston or ball to fall to the bottom of the column. 7. Remove the mouthpiece from your mouth and breathe out normally. 8. Rest for a few seconds and repeat Steps 1 through 7 at least 10 times every 1-2 hours when you are awake. Take your time and take a few normal breaths between deep breaths. 9. The spirometer may include an indicator to show  your best effort. Use the indicator as a goal to work toward during each repetition. 10. After each set of 10 deep breaths, practice coughing to be sure your lungs are clear. If you have an incision (the cut made at the time of surgery), support your incision when coughing by placing a pillow or rolled up towels firmly  against it. Once you are able to get out of bed, walk around indoors and cough well. You may stop using the incentive spirometer when instructed by your caregiver.  RISKS AND COMPLICATIONS  Take your time so you do not get dizzy or light-headed.  If you are in pain, you may need to take or ask for pain medication before doing incentive spirometry. It is harder to take a deep breath if you are having pain. AFTER USE  Rest and breathe slowly and easily.  It can be helpful to keep track of a log of your progress. Your caregiver can provide you with a simple table to help with this. If you are using the spirometer at home, follow these instructions: [place] IF:   You are having difficultly using the spirometer.  You have trouble using the spirometer as often as instructed.  Your pain medication is not giving enough relief while using the spirometer.  You develop fever of 100.5 F (38.1 C) or higher. SEEK IMMEDIATE MEDICAL CARE IF:   You cough up bloody sputum that had not been present before.  You develop fever of 102 F (38.9 C) or greater.  You develop worsening pain at or near the incision site. MAKE SURE YOU:   Understand these instructions.  Will watch your condition.  Will get help right away if you are not doing well or get worse. Document Released: 03/24/2007 Document Revised: 02/03/2012 Document Reviewed: 05/25/2007 ExitCare Patient Information 2014 ExitCare, [place].   ________________________________________________________________________  WHAT IS A BLOOD TRANSFUSION? Blood Transfusion Information  A transfusion is the replacement of blood or some of its parts. Blood is made up of multiple cells which provide different functions.  Red blood cells carry oxygen and are used for blood loss replacement.  White blood cells fight against infection.  Platelets control bleeding.  Plasma helps clot blood.  Other blood products are available for  specialized needs, such as hemophilia or other clotting disorders. BEFORE THE TRANSFUSION  Who gives blood for transfusions?   Healthy volunteers who are fully evaluated to make sure their blood is safe. This is blood bank blood. Transfusion therapy is the safest it has ever been in the practice of medicine. Before blood is taken from a donor, a complete history is taken to make sure that person has no history of diseases nor engages in risky social behavior (examples are intravenous drug use or sexual activity with multiple partners). The donor's travel history is screened to minimize risk of transmitting infections, such as malaria. The donated blood is tested for signs of infectious diseases, such as HIV and hepatitis. The blood is then tested to be sure it is compatible with you in order to minimize the chance of a transfusion reaction. If you or a relative donates blood, this is often done in anticipation of surgery and is not appropriate for emergency situations. It takes many days to process the donated blood. RISKS AND COMPLICATIONS Although transfusion therapy is very safe and saves many lives, the main dangers of transfusion include:   Getting an infectious disease.  Developing a transfusion reaction. This is an allergic reaction to  something in the blood you were given. Every precaution is taken to prevent this. The decision to have a blood transfusion has been considered carefully by your caregiver before blood is given. Blood is not given unless the benefits outweigh the risks. AFTER THE TRANSFUSION  Right after receiving a blood transfusion, you will usually feel much better and more energetic. This is especially true if your red blood cells have gotten low (anemic). The transfusion raises the level of the red blood cells which carry oxygen, and this usually causes an energy increase.  The nurse administering the transfusion will monitor you carefully for complications. HOME CARE  INSTRUCTIONS  No special instructions are needed after a transfusion. You may find your energy is better. Speak with your caregiver about any limitations on activity for underlying diseases you may have. SEEK MEDICAL CARE IF:   Your condition is not improving after your transfusion.  You develop redness or irritation at the intravenous (IV) site. SEEK IMMEDIATE MEDICAL CARE IF:  Any of the following symptoms occur over the next 12 hours:  Shaking chills.  You have a temperature by mouth above 102 F (38.9 C), not controlled by medicine.  Chest, back, or muscle pain.  People around you feel you are not acting correctly or are confused.  Shortness of breath or difficulty breathing.  Dizziness and fainting.  You get a rash or develop hives.  You have a decrease in urine output.  Your urine turns a dark color or changes to pink, red, or brown. Any of the following symptoms occur over the next 10 days:  You have a temperature by mouth above 102 F (38.9 C), not controlled by medicine.  Shortness of breath.  Weakness after normal activity.  The white part of the eye turns yellow (jaundice).  You have a decrease in the amount of urine or are urinating less often.  Your urine turns a dark color or changes to pink, red, or brown. Document Released: 11/08/2000 Document Revised: 02/03/2012 Document Reviewed: 06/27/2008 [facility] Patient Information 2014 [place], [place].  _______________________________________________________________________

## 2019-01-14 ENCOUNTER — Encounter (HOSPITAL_COMMUNITY)
Admission: RE | Admit: 2019-01-14 | Discharge: 2019-01-14 | Disposition: A | Discharge status: Home / Self Care | Payer: Self-pay | Source: Ambulatory Visit | Attending: Urology | Admitting: Urology

## 2019-01-14 ENCOUNTER — Other Ambulatory Visit: Payer: Self-pay

## 2019-01-14 ENCOUNTER — Encounter (HOSPITAL_COMMUNITY): Payer: Self-pay

## 2019-01-14 DIAGNOSIS — Z01812 Encounter for preprocedural laboratory examination: Secondary | ICD-10-CM | POA: Insufficient documentation

## 2019-01-14 LAB — BASIC METABOLIC PANEL
Anion gap: 8 (ref 5–15)
BUN: 18 mg/dL (ref 6–20)
CO2: 25 mmol/L (ref 22–32)
Calcium: 9.5 mg/dL (ref 8.9–10.3)
Chloride: 104 mmol/L (ref 98–111)
Creatinine, Ser: 0.79 mg/dL (ref 0.44–1.00)
GFR calc Af Amer: >60 mL/min (ref >60)
GFR calc non Af Amer: >60 mL/min (ref >60)
Glucose, Bld: 93 mg/dL (ref 70–99)
Potassium: 4.1 mmol/L (ref 3.5–5.1)
Sodium: 137 mmol/L (ref 135–145)

## 2019-01-14 LAB — CBC
HCT: 42.2 % (ref 36.0–46.0)
Hemoglobin: 13.1 g/dL (ref 12.0–15.0)
MCH: 32.4 pg (ref 26.0–34.0)
MCHC: 31 g/dL (ref 30.0–36.0)
MCV: 104.5 fL — ABNORMAL HIGH (ref 80.0–100.0)
Platelets: 293 10*3/uL (ref 150–400)
RBC: 4.04 MIL/uL (ref 3.87–5.11)
RDW: 13.7 % (ref 11.5–15.5)
WBC: 10.7 10*3/uL — ABNORMAL HIGH (ref 4.0–10.5)
nRBC: 0 % (ref 0.0–0.2)

## 2019-01-14 LAB — ABO/RH: ABO/RH(D): O POS

## 2019-01-16 LAB — URINE CULTURE: Culture: >=100000 — AB

## 2019-01-19 ENCOUNTER — Telehealth: Payer: Self-pay | Admitting: Critical Care Medicine

## 2019-01-19 MED ORDER — GENTAMICIN SULFATE 40 MG/ML IJ SOLN
5.0000 mg/kg | INTRAVENOUS | Status: AC
  Administered 2019-01-20: 310 mg via INTRAVENOUS
  Filled 2019-01-19: qty 7.75

## 2019-01-19 NOTE — Telephone Encounter (Signed)
Patient called with concerns regarding her surgery she is having tomorrow. Patient would like to speak to Ranson regarding his opinion with the surgery. Patient states that they are wanting to remove the stem but there are kidney stones around it embedded in it. Please follow up.

## 2019-01-19 NOTE — Anesthesia Preprocedure Evaluation (Addendum)
Anesthesia Evaluation  Patient identified by MRN, date of birth, ID band Patient awake    Reviewed: Allergy & Precautions, NPO status , Patient's Chart, lab work & pertinent test results  Airway Mallampati: II  TM Distance: >3 FB Neck ROM: Full    Dental  (+) Edentulous Upper, Missing   Pulmonary Current Smoker,    Pulmonary exam normal breath sounds clear to auscultation       Cardiovascular negative cardio ROS Normal cardiovascular exam Rhythm:Regular Rate:Normal     Neuro/Psych PSYCHIATRIC DISORDERS Anxiety Depression Bipolar Disorder negative neurological ROS     GI/Hepatic negative GI ROS, (+) Hepatitis -, C  Endo/Other  negative endocrine ROS  Renal/GU negative Renal ROS     Musculoskeletal negative musculoskeletal ROS (+)   Abdominal   Peds  Hematology negative hematology ROS (+)   Anesthesia Other Findings RETAINED LEFT URETERAL STENT NEPHROLITHIASIS  Reproductive/Obstetrics                            Anesthesia Physical Anesthesia Plan  ASA: III  Anesthesia Plan: General   Post-op Pain Management:    Induction: Intravenous  PONV Risk Score and Plan: 3 and Ondansetron, Dexamethasone, Midazolam and Treatment may vary due to age or medical condition  Airway Management Planned: Oral ETT  Additional Equipment:   Intra-op Plan:   Post-operative Plan: Extubation in OR  Informed Consent: I have reviewed the patients History and Physical, chart, labs and discussed the procedure including the risks, benefits and alternatives for the proposed anesthesia with the patient or authorized representative who has indicated his/her understanding and acceptance.     Dental advisory given  Plan Discussed with: CRNA  Anesthesia Plan Comments:        Anesthesia Quick Evaluation

## 2019-01-19 NOTE — Telephone Encounter (Signed)
Dr. Joya Gaskins spoke to patient in reference to her request

## 2019-01-19 NOTE — Telephone Encounter (Signed)
I called and spoke to the pt. She will proceed with surgery

## 2019-01-19 NOTE — Telephone Encounter (Signed)
Thank you Dr. Wright.

## 2019-01-19 NOTE — Telephone Encounter (Signed)
New Message   Pt is calling, states she has surgery scheduled for tomorrow 01/20/19 and she has some important questions to ask Dr. Joya Gaskins. Please f/u

## 2019-01-20 ENCOUNTER — Encounter (HOSPITAL_COMMUNITY)
Admission: RE | Disposition: A | Discharge status: Home / Self Care | Payer: Self-pay | Source: Home / Self Care | Attending: Urology

## 2019-01-20 ENCOUNTER — Encounter (HOSPITAL_COMMUNITY): Payer: Self-pay | Admitting: *Deleted

## 2019-01-20 ENCOUNTER — Ambulatory Visit (HOSPITAL_COMMUNITY)
Admission: RE | Admit: 2019-01-20 | Discharge: 2019-01-22 | Disposition: A | Discharge status: Home / Self Care | Payer: Medicaid Other | Attending: Urology | Admitting: Urology

## 2019-01-20 ENCOUNTER — Other Ambulatory Visit: Payer: Self-pay

## 2019-01-20 ENCOUNTER — Inpatient Hospital Stay (HOSPITAL_COMMUNITY): Payer: Medicaid Other | Admitting: Physician Assistant

## 2019-01-20 ENCOUNTER — Inpatient Hospital Stay (HOSPITAL_COMMUNITY): Payer: Medicaid Other | Admitting: Anesthesiology

## 2019-01-20 ENCOUNTER — Inpatient Hospital Stay (HOSPITAL_COMMUNITY): Payer: Medicaid Other

## 2019-01-20 DIAGNOSIS — B1920 Unspecified viral hepatitis C without hepatic coma: Secondary | ICD-10-CM | POA: Insufficient documentation

## 2019-01-20 DIAGNOSIS — N210 Calculus in bladder: Secondary | ICD-10-CM | POA: Insufficient documentation

## 2019-01-20 DIAGNOSIS — Z87442 Personal history of urinary calculi: Secondary | ICD-10-CM

## 2019-01-20 DIAGNOSIS — Y831 Surgical operation with implant of artificial internal device as the cause of abnormal reaction of the patient, or of later complication, without mention of misadventure at the time of the procedure: Secondary | ICD-10-CM | POA: Insufficient documentation

## 2019-01-20 DIAGNOSIS — F17210 Nicotine dependence, cigarettes, uncomplicated: Secondary | ICD-10-CM | POA: Insufficient documentation

## 2019-01-20 DIAGNOSIS — T83192A Other mechanical complication of urinary stent, initial encounter: Secondary | ICD-10-CM | POA: Diagnosis present

## 2019-01-20 HISTORY — PX: URETEROSCOPY: SHX842

## 2019-01-20 HISTORY — DX: Calculus of kidney: N20.0

## 2019-01-20 HISTORY — PX: NEPHROLITHOTOMY: SHX5134

## 2019-01-20 HISTORY — PX: CYSTOSCOPY WITH LITHOLAPAXY: SHX1425

## 2019-01-20 HISTORY — PX: CYSTOSCOPY W/ URETERAL STENT REMOVAL: SHX1430

## 2019-01-20 LAB — TYPE AND SCREEN
ABO/RH(D): O POS
Antibody Screen: NEGATIVE

## 2019-01-20 LAB — HEMOGLOBIN AND HEMATOCRIT, BLOOD
HCT: 40.2 % (ref 36.0–46.0)
Hemoglobin: 12.7 g/dL (ref 12.0–15.0)

## 2019-01-20 SURGERY — NEPHROLITHOTOMY PERCUTANEOUS
Anesthesia: General | Site: Ureter

## 2019-01-20 MED ORDER — KETOROLAC TROMETHAMINE 15 MG/ML IJ SOLN
15.0000 mg | Freq: Once | INTRAMUSCULAR | Status: AC | PRN
  Administered 2019-01-20: 15 mg via INTRAVENOUS

## 2019-01-20 MED ORDER — ONDANSETRON HCL 4 MG/2ML IJ SOLN: 4.0000 mg | Freq: Once | INTRAMUSCULAR | Status: DC | PRN

## 2019-01-20 MED ORDER — PROPOFOL 10 MG/ML IV BOLUS
INTRAVENOUS | Status: DC | PRN
  Administered 2019-01-20: 200 mg via INTRAVENOUS

## 2019-01-20 MED ORDER — PNEUMOCOCCAL VAC POLYVALENT 25 MCG/0.5ML IJ INJ: 0.5000 mL | INJECTION | INTRAMUSCULAR | Status: DC

## 2019-01-20 MED ORDER — SUGAMMADEX SODIUM 200 MG/2ML IV SOLN
INTRAVENOUS | Status: AC
  Filled 2019-01-20: qty 2

## 2019-01-20 MED ORDER — SUGAMMADEX SODIUM 200 MG/2ML IV SOLN
INTRAVENOUS | Status: DC | PRN
  Administered 2019-01-20: 200 mg via INTRAVENOUS

## 2019-01-20 MED ORDER — FENTANYL CITRATE (PF) 250 MCG/5ML IJ SOLN
INTRAMUSCULAR | Status: AC
  Filled 2019-01-20: qty 5

## 2019-01-20 MED ORDER — SODIUM CHLORIDE 0.9 % IR SOLN
Status: DC | PRN
  Administered 2019-01-20 (×2): 6000 mL via INTRAVESICAL

## 2019-01-20 MED ORDER — IOPAMIDOL (ISOVUE-300) INJECTION 61%
INTRAVENOUS | Status: DC | PRN
  Administered 2019-01-20: 20 mL

## 2019-01-20 MED ORDER — SENNA 8.6 MG PO TABS
1.0000 | ORAL_TABLET | Freq: Two times a day (BID) | ORAL | Status: DC
  Administered 2019-01-20 – 2019-01-22 (×5): 8.6 mg via ORAL
  Filled 2019-01-20 (×5): qty 1

## 2019-01-20 MED ORDER — LACTATED RINGERS IV SOLN
INTRAVENOUS | Status: DC | PRN
  Administered 2019-01-20 (×2): via INTRAVENOUS

## 2019-01-20 MED ORDER — OXYCODONE HCL 5 MG PO TABS
5.0000 mg | ORAL_TABLET | ORAL | Status: DC | PRN
  Administered 2019-01-20 – 2019-01-22 (×7): 10 mg via ORAL
  Filled 2019-01-20 (×7): qty 2

## 2019-01-20 MED ORDER — EPHEDRINE SULFATE-NACL 50-0.9 MG/10ML-% IV SOSY
PREFILLED_SYRINGE | INTRAVENOUS | Status: DC | PRN
  Administered 2019-01-20 (×3): 5 mg via INTRAVENOUS

## 2019-01-20 MED ORDER — PROPOFOL 10 MG/ML IV BOLUS
INTRAVENOUS | Status: AC
  Filled 2019-01-20: qty 20

## 2019-01-20 MED ORDER — OXYCODONE HCL 5 MG PO TABS
ORAL_TABLET | ORAL | Status: AC
  Filled 2019-01-20: qty 2

## 2019-01-20 MED ORDER — FENTANYL CITRATE (PF) 100 MCG/2ML IJ SOLN
25.0000 ug | INTRAMUSCULAR | Status: DC | PRN
  Administered 2019-01-20 (×3): 50 ug via INTRAVENOUS

## 2019-01-20 MED ORDER — KETOROLAC TROMETHAMINE 15 MG/ML IJ SOLN
INTRAMUSCULAR | Status: AC
  Filled 2019-01-20: qty 1

## 2019-01-20 MED ORDER — MIDAZOLAM HCL 5 MG/5ML IJ SOLN
INTRAMUSCULAR | Status: DC | PRN
  Administered 2019-01-20 (×2): 1 mg via INTRAVENOUS

## 2019-01-20 MED ORDER — ACETAMINOPHEN 500 MG PO TABS
500.0000 mg | ORAL_TABLET | Freq: Once | ORAL | Status: AC
  Administered 2019-01-20: 500 mg via ORAL
  Filled 2019-01-20: qty 1

## 2019-01-20 MED ORDER — LACTATED RINGERS IV SOLN
INTRAVENOUS | Status: DC
  Administered 2019-01-20: 08:00:00 via INTRAVENOUS

## 2019-01-20 MED ORDER — ROCURONIUM BROMIDE 50 MG/5ML IV SOSY
PREFILLED_SYRINGE | INTRAVENOUS | Status: DC | PRN
  Administered 2019-01-20: 30 mg via INTRAVENOUS
  Administered 2019-01-20 (×2): 20 mg via INTRAVENOUS
  Administered 2019-01-20: 10 mg via INTRAVENOUS

## 2019-01-20 MED ORDER — LIDOCAINE 2% (20 MG/ML) 5 ML SYRINGE
INTRAMUSCULAR | Status: DC | PRN
  Administered 2019-01-20: 80 mg via INTRAVENOUS

## 2019-01-20 MED ORDER — FENTANYL CITRATE (PF) 100 MCG/2ML IJ SOLN
INTRAMUSCULAR | Status: AC
  Filled 2019-01-20: qty 2

## 2019-01-20 MED ORDER — DEXAMETHASONE SODIUM PHOSPHATE 10 MG/ML IJ SOLN
INTRAMUSCULAR | Status: DC | PRN
  Administered 2019-01-20: 10 mg via INTRAVENOUS

## 2019-01-20 MED ORDER — 0.9 % SODIUM CHLORIDE (POUR BTL) OPTIME
TOPICAL | Status: DC | PRN
  Administered 2019-01-20: 1000 mL

## 2019-01-20 MED ORDER — SODIUM CHLORIDE 0.9 % IV SOLN
2.0000 g | Freq: Once | INTRAVENOUS | Status: AC
  Administered 2019-01-20: 2 g via INTRAVENOUS
  Filled 2019-01-20: qty 2

## 2019-01-20 MED ORDER — ONDANSETRON HCL 4 MG/2ML IJ SOLN: 4.0000 mg | INTRAMUSCULAR | Status: DC | PRN

## 2019-01-20 MED ORDER — FENTANYL CITRATE (PF) 100 MCG/2ML IJ SOLN
INTRAMUSCULAR | Status: DC | PRN
  Administered 2019-01-20 (×7): 50 ug via INTRAVENOUS

## 2019-01-20 MED ORDER — MORPHINE SULFATE (PF) 4 MG/ML IV SOLN
2.0000 mg | INTRAVENOUS | Status: DC | PRN
  Administered 2019-01-20 – 2019-01-22 (×7): 2 mg via INTRAVENOUS
  Filled 2019-01-20 (×7): qty 1

## 2019-01-20 MED ORDER — FENTANYL CITRATE (PF) 100 MCG/2ML IJ SOLN
INTRAMUSCULAR | Status: AC
  Filled 2019-01-20: qty 4

## 2019-01-20 MED ORDER — ONDANSETRON HCL 4 MG/2ML IJ SOLN
INTRAMUSCULAR | Status: DC | PRN
  Administered 2019-01-20: 4 mg via INTRAVENOUS

## 2019-01-20 MED ORDER — SUCCINYLCHOLINE CHLORIDE 200 MG/10ML IV SOSY
PREFILLED_SYRINGE | INTRAVENOUS | Status: AC
  Filled 2019-01-20: qty 10

## 2019-01-20 MED ORDER — MIDAZOLAM HCL 2 MG/2ML IJ SOLN
INTRAMUSCULAR | Status: AC
  Filled 2019-01-20: qty 2

## 2019-01-20 MED ORDER — ACETAMINOPHEN 500 MG PO TABS
1000.0000 mg | ORAL_TABLET | Freq: Four times a day (QID) | ORAL | Status: AC
  Administered 2019-01-20 – 2019-01-21 (×4): 1000 mg via ORAL
  Filled 2019-01-20 (×4): qty 2

## 2019-01-20 MED ORDER — LIDOCAINE 2% (20 MG/ML) 5 ML SYRINGE
INTRAMUSCULAR | Status: AC
  Filled 2019-01-20: qty 5

## 2019-01-20 MED ORDER — ONDANSETRON HCL 4 MG/2ML IJ SOLN
INTRAMUSCULAR | Status: AC
  Filled 2019-01-20: qty 2

## 2019-01-20 MED ORDER — SODIUM CHLORIDE 0.9 % IV SOLN
INTRAVENOUS | Status: DC
  Administered 2019-01-20 – 2019-01-21 (×2): via INTRAVENOUS

## 2019-01-20 MED ORDER — INFLUENZA VAC SPLIT QUAD 0.5 ML IM SUSY
0.5000 mL | PREFILLED_SYRINGE | INTRAMUSCULAR | Status: DC
  Filled 2019-01-20: qty 0.5

## 2019-01-20 MED ORDER — DEXAMETHASONE SODIUM PHOSPHATE 10 MG/ML IJ SOLN
INTRAMUSCULAR | Status: AC
  Filled 2019-01-20: qty 1

## 2019-01-20 MED ORDER — ROCURONIUM BROMIDE 100 MG/10ML IV SOLN
INTRAVENOUS | Status: AC
  Filled 2019-01-20: qty 1

## 2019-01-20 SURGICAL SUPPLY — 67 items
BAG URINE DRAINAGE (UROLOGICAL SUPPLIES) ×10 IMPLANT
BAG URO CATCHER STRL LF (MISCELLANEOUS) ×5 IMPLANT
BASKET LASER NITINOL 1.9FR (BASKET) ×10 IMPLANT
BASKET ZERO TIP NITINOL 2.4FR (BASKET) ×5 IMPLANT
BENZOIN TINCTURE PRP APPL 2/3 (GAUZE/BANDAGES/DRESSINGS) ×10 IMPLANT
BLADE SURG 15 STRL LF DISP TIS (BLADE) ×3 IMPLANT
BLADE SURG 15 STRL SS (BLADE) ×2
CATH FOLEY 2W COUNCIL 20FR 5CC (CATHETERS) IMPLANT
CATH FOLEY 2WAY SLVR  5CC 16FR (CATHETERS) ×2
CATH FOLEY 2WAY SLVR 5CC 16FR (CATHETERS) ×3 IMPLANT
CATH IMAGER II 65CM (CATHETERS) ×5 IMPLANT
CATH INTERMIT  6FR 70CM (CATHETERS) ×5 IMPLANT
CATH MULTI PURPOSE 16FR DRAIN (CATHETERS) ×5 IMPLANT
CATH ROBINSON RED A/P 20FR (CATHETERS) IMPLANT
CATH ULTRATHANE 14FR (CATHETERS) ×5 IMPLANT
CATH UROLOGY TORQUE 40 (MISCELLANEOUS) ×5 IMPLANT
CATH X-FORCE N30 NEPHROSTOMY (TUBING) ×5 IMPLANT
CHLORAPREP W/TINT 26ML (MISCELLANEOUS) ×10 IMPLANT
COVER SURGICAL LIGHT HANDLE (MISCELLANEOUS) ×5 IMPLANT
COVER WAND RF STERILE (DRAPES) IMPLANT
DRAPE C-ARM 42X120 X-RAY (DRAPES) ×5 IMPLANT
DRAPE LINGEMAN PERC (DRAPES) ×5 IMPLANT
DRAPE SHEET LG 3/4 BI-LAMINATE (DRAPES) ×5 IMPLANT
DRAPE SURG IRRIG POUCH 19X23 (DRAPES) ×5 IMPLANT
DRSG PAD ABDOMINAL 8X10 ST (GAUZE/BANDAGES/DRESSINGS) ×10 IMPLANT
DRSG TEGADERM 8X12 (GAUZE/BANDAGES/DRESSINGS) ×5 IMPLANT
FIBER LASER FLEXIVA 1000 (UROLOGICAL SUPPLIES) IMPLANT
FIBER LASER FLEXIVA 365 (UROLOGICAL SUPPLIES) IMPLANT
FIBER LASER FLEXIVA 550 (UROLOGICAL SUPPLIES) IMPLANT
FIBER LASER TRAC TIP (UROLOGICAL SUPPLIES) IMPLANT
GAUZE SPONGE 4X4 12PLY STRL (GAUZE/BANDAGES/DRESSINGS) ×5 IMPLANT
GLOVE BIOGEL M STRL SZ7.5 (GLOVE) ×15 IMPLANT
GOWN STRL REUS W/TWL LRG LVL3 (GOWN DISPOSABLE) ×10 IMPLANT
GUIDEWIRE AMPLAZ .035X145 (WIRE) ×10 IMPLANT
GUIDEWIRE ANG ZIPWIRE 038X150 (WIRE) ×10 IMPLANT
GUIDEWIRE STR DUAL SENSOR (WIRE) IMPLANT
HOLDER FOLEY CATH W/STRAP (MISCELLANEOUS) ×5 IMPLANT
IV SET EXTENSION CATH 6 NF (IV SETS) ×5 IMPLANT
KIT BASIN OR (CUSTOM PROCEDURE TRAY) ×5 IMPLANT
KIT PROBE TRILOGY 3.9X350 (MISCELLANEOUS) ×5 IMPLANT
MANIFOLD NEPTUNE II (INSTRUMENTS) ×5 IMPLANT
NEEDLE TROCAR 18X15 ECHO (NEEDLE) IMPLANT
NEEDLE TROCAR 18X20 (NEEDLE) IMPLANT
NS IRRIG 1000ML POUR BTL (IV SOLUTION) ×5 IMPLANT
PACK CYSTO (CUSTOM PROCEDURE TRAY) ×5 IMPLANT
PROBE LITHOCLAST ULTRA 3.8X403 (UROLOGICAL SUPPLIES) IMPLANT
PROBE PNEUMATIC 1.0MMX570MM (UROLOGICAL SUPPLIES) ×5 IMPLANT
SET IRRIG Y TYPE TUR BLADDER L (SET/KITS/TRAYS/PACK) ×5 IMPLANT
SHEATH PEELAWAY SET 9 (SHEATH) ×5 IMPLANT
SPONGE LAP 4X18 RFD (DISPOSABLE) ×5 IMPLANT
STONE CATCHER W/TUBE ADAPTER (UROLOGICAL SUPPLIES) ×5 IMPLANT
SUT ETHILON 3 0 PS 1 (SUTURE) ×5 IMPLANT
SUT SILK 2 0 30  PSL (SUTURE) ×2
SUT SILK 2 0 30 PSL (SUTURE) ×3 IMPLANT
SUT VIC AB 2-0 CT1 27 (SUTURE) ×2
SUT VIC AB 2-0 CT1 TAPERPNT 27 (SUTURE) ×3 IMPLANT
SYR 10ML LL (SYRINGE) ×5 IMPLANT
SYR 20CC LL (SYRINGE) ×10 IMPLANT
SYR 50ML LL SCALE MARK (SYRINGE) ×5 IMPLANT
TOWEL OR 17X26 10 PK STRL BLUE (TOWEL DISPOSABLE) ×5 IMPLANT
TRAY FOLEY MTR SLVR 16FR STAT (SET/KITS/TRAYS/PACK) ×5 IMPLANT
TUBE CONNECTING VINYL 14FR 30C (TUBING) ×5 IMPLANT
TUBE FEEDING 8FR 16IN STR KANG (MISCELLANEOUS) ×5 IMPLANT
TUBING CONNECTING 10 (TUBING) ×16 IMPLANT
TUBING CONNECTING 10' (TUBING) ×4
WATER STERILE IRR 1000ML POUR (IV SOLUTION) ×5 IMPLANT
WATER STERILE IRR 3000ML UROMA (IV SOLUTION) ×10 IMPLANT

## 2019-01-20 NOTE — Progress Notes (Signed)
Post-op check  S/p Left PCNL this morning Patient recovering well post op Hgb 12.7 Vitals stable Pain fairly well controlled Foley draining well. Left PCN capped appropriately

## 2019-01-20 NOTE — H&P (Signed)
Kimberly [name] is an 58 y.o. female.    Chief Complaint: Pre-Op LEFT Percutaneous Nephrostolithotomy and Cystolithalopexy  HPI:   Retained left ureteral stent - 2cm left renal on upper end of stent + 2cm left lower pole stone + 2cm distal curl stone on stent that was orrignally placed 2018 and she did not return for follow up. Now with left neph tueb most recenly changed 1/24. Most recent CXU psudomonas Res Cipro but sens gent.    No interval fevers or neph tube problems.  Past Medical History:  Diagnosis Date  . Anxiety   . Bipolar 1 disorder ([place])   . Depression   . Hepatitis C     Past Surgical History:  Procedure Laterality Date  . ACROMIO-CLAVICULAR JOINT REPAIR Left 02/24/2015   Procedure: LEFT ACROMIO-CLAVICULAR JOINT RECONSTRUCTION;  Surgeon: Leandrew Koyanagi, MD;  Location: [facility];  Service: Orthopedics;  Laterality: Left;  . CYSTOSCOPY W/ URETERAL STENT PLACEMENT Left 07/22/2017   Procedure: CYSTOSCOPY WITH RETROGRADE PYELOGRAM/URETERAL STENT PLACEMENT;  Surgeon: Alexis Frock, MD;  Location: [facility];  Service: Urology;  Laterality: Left;  . IR NEPHROSTOMY PLACEMENT LEFT  11/18/2018  . IR NEPHROSTOMY TUBE CHANGE  12/18/2018  . ORIF MANDIBULAR FRACTURE N/A 03/22/2016   Procedure: OPEN REDUCTION INTERNAL FIXATION (ORIF) MANDIBULAR FRACTURE;  Surgeon: Melissa Montane, MD;  Location: [facility];  Service: ENT;  Laterality: N/A;    No family history on file. Social History:  reports that she has been smoking cigarettes. She has been smoking about 1.00 pack per day. She has never used smokeless tobacco. She reports previous alcohol use of about 1.0 standard drinks of alcohol per week. She reports previous drug use.  Allergies: No Known Allergies  No medications prior to admission.    No results found for this or any previous visit (from the past 48 hour(s)). No results found.  Review of Systems  Constitutional: Negative.  Negative for chills and fever.    Last menstrual period  10/09/2012. Physical Exam  Constitutional: She appears well-developed.  HENT:  Head: Normocephalic.  Eyes: Pupils are equal, round, and reactive to light.  Neck: Normal range of motion.  Cardiovascular: Normal rate.  Respiratory: Effort normal.  GI: Soft.  Mild truncal obesity.   Genitourinary:    Genitourinary Comments: Left neph tube in place, draining well.    Musculoskeletal: Normal range of motion.  Neurological: She is alert.  Psychiatric: She has a normal mood and affect.     Assessment/Plan  Proceed as planned with left PCNL through existing tract, then cystolithalopexy. Risks, benefits, alternatives, expected peri-op course disucssed previously and reiterated today.   Alexis Frock, MD 01/20/2019, 6:54 AM

## 2019-01-20 NOTE — Anesthesia Postprocedure Evaluation (Signed)
Anesthesia Post Note  Patient: Celie Desrochers  Procedure(s) Performed: NEPHROLITHOTOMY PERCUTANEOUS (Left ) CYSTOSCOPY WITH LITHOLAPAXY (N/A ) CYSTOSCOPY WITH STENT REMOVAL (Left ) DIAGNOSTIC URETEROSCOPY (Left Ureter)     Patient location during evaluation: PACU Anesthesia Type: General Level of consciousness: awake and alert Pain management: pain level controlled Vital Signs Assessment: post-procedure vital signs reviewed and stable Respiratory status: spontaneous breathing, nonlabored ventilation, respiratory function stable and patient connected to nasal cannula oxygen Cardiovascular status: blood pressure returned to baseline and stable Postop Assessment: no apparent nausea or vomiting Anesthetic complications: no    Last Vitals:  Vitals:   01/20/19 1400 01/20/19 1456  BP: 128/68 140/81  Pulse: 66 69  Resp: 14 14  Temp: 36.5 C 36.9 C  SpO2: 92% 98%    Last Pain:  Vitals:   01/20/19 1456  TempSrc: Oral  PainSc:                  Ryan P Ellender

## 2019-01-20 NOTE — Progress Notes (Signed)
Pt arrived to unit from PACU on stretcher, slid self to floor bed. VSS. Reports pain 6/10 at left flank but reports this is tolerable at present. Dr Tresa Moore in to see pt and reports bloody urine drainage from foley appropriate at this time. Pt oriented to callbell and environment. POC discussed. Family bedsdie.

## 2019-01-20 NOTE — Anesthesia Procedure Notes (Signed)
Procedure Name: Intubation Date/Time: 01/20/2019 9:53 AM Performed by: Maxwell Caul, CRNA Pre-anesthesia Checklist: Patient identified, Emergency Drugs available, Suction available and Patient being monitored Patient Re-evaluated:Patient Re-evaluated prior to induction Oxygen Delivery Method: Circle system utilized Preoxygenation: Pre-oxygenation with 100% oxygen Induction Type: IV induction Ventilation: Mask ventilation without difficulty Laryngoscope Size: Mac and 4 Grade View: Grade I Tube type: Oral Tube size: 7.5 mm Number of attempts: 1 Airway Equipment and Method: Stylet Placement Confirmation: ETT inserted through vocal cords under direct vision,  positive ETCO2 and breath sounds checked- equal and bilateral Secured at: 21 cm Tube secured with: Tape Dental Injury: Teeth and Oropharynx as per pre-operative assessment

## 2019-01-20 NOTE — Discharge Instructions (Signed)
1 - You may have urinary urgency (bladder spasms) and bloody urine on / off with stent in place. This is normal. ° °2 - Call MD or go to ER for fever >102, severe pain / nausea / vomiting not relieved by medications, or acute change in medical status ° °

## 2019-01-20 NOTE — Transfer of Care (Signed)
Immediate Anesthesia Transfer of Care Note  Patient: Kimberly [name]  Procedure(s) Performed: NEPHROLITHOTOMY PERCUTANEOUS (Left ) CYSTOSCOPY WITH LITHOLAPAXY (N/A ) CYSTOSCOPY WITH STENT REMOVAL (Left ) HOLMIUM LASER APPLICATION (Left )  Patient Location: PACU  Anesthesia Type:General  Level of Consciousness: awake, alert  and oriented  Airway & Oxygen Therapy: Patient Spontanous Breathing and Patient connected to face mask oxygen  Post-op Assessment: Report given to RN and Post -op Vital signs reviewed and stable  Post vital signs: Reviewed and stable  Last Vitals:  Vitals Value Taken Time  BP 152/84 01/20/2019 12:12 PM  Temp 36.4 C 01/20/2019 12:12 PM  Pulse 89 01/20/2019 12:15 PM  Resp 14 01/20/2019 12:15 PM  SpO2 100 % 01/20/2019 12:15 PM  Vitals shown include unvalidated device data.  Last Pain:  Vitals:   01/20/19 0802  TempSrc:   PainSc: 8       Patients Stated Pain Goal: 3 (92/90/90 3014)  Complications: No apparent anesthesia complications

## 2019-01-20 NOTE — Op Note (Signed)
Kimberly [name], Kimberly [name] MEDICAL RECORD [id number] ACCOUNT [id number] DATE OF BIRTH:05/02/1961 FACILITY: [facility] LOCATION: [id number] PHYSICIAN:Loron Weimer, MD  OPERATIVE REPORT  DATE OF PROCEDURE:  01/20/2019  PREOPERATIVE DIAGNOSIS:  Retained left ureteral stent with renal and bladder stones.  PROCEDURE: 1.  Left percutaneous nephrostolithotomy stone greater than 2 cm. 2.  Left diagnostic ureteroscopy. 3.  Left antegrade nephrostogram and interpretation. 4.  Exchange left ureteral stent. 5.  Cystolitholapaxy stone greater than 2 cm.  ESTIMATED BLOOD LOSS:  50 mL  COMPLICATIONS:  None.  SPECIMENS:   1.  Left renal stone fragments bladder stone fragments for discard. 2.  Retained left ureteral stent given.  FINDINGS: 1.  Approximately 2.5 cm left proximal curl stone, 3 cm distal curl stone, retained stent. 2.  Small volume left lower pole stone. 3.  Complete resolution of all accessible stone fragments larger than one-third mm following percutaneous nephrostolithotomy. 4.  Unremarkable left ureter. 5.  Successful exchange of retained left ureteral stent for a left nephroureteral stent externalized and capped.  INDICATIONS:  The patient is a 58 year old lady with history of medical noncompliance who had a left ureteral stent placed in 2018 for an obstructing stone.  She failed to follow up.  We attempted to contact her multiple times and sent certified letters  explaining the dangers of a retained stent.  She subsequently reemerged due to obstruction from her retained stent that had not become significantly encrusted.  She had a left nephrostomy tube placed and she now presents for management of this with  retrieval of retained stent with combination of antegrade and retrograde approach.  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  Informed consent was then placed  in medical  record.  DESCRIPTION OF PROCEDURE:  The patient was identified.  The procedure being left percutaneous nephrostolithotomy and stent retrieval.  Cystolitholapaxy was confirmed.  Procedure timeout performed.  Intravenous antibiotics administered.  General  endotracheal anesthesia induced.  The patient was placed into a modified left side up sloppy lithotomy position employing a bump under her left flank allowing access to her in situ nephrostomy tube and her vagina and introitus.  Her left arm was brought  over her chest to the right side and fashioned in place between 2 pillows.  All bony prominences were padded and 2 separate sterile fields were created.  The first at the area of the left nephrostomy tube using chlorhexidine gluconate and a second at the  level of the vagina and urethra using iodine.  Foley catheter was placed free to straight drain.  A percutaneous drape was applied.  Initial antegrade nephrostogram was then performed.  Antegrade nephrostogram revealed proximal end curled in the renal pelvis.  No evidence of extravasation.  A ZIPwire was advanced to the level of the renal pelvis through the nephrostomy tube which was then removed and using a KMP catheter.  This was  navigated down the ureter until the urinary bladder and exchanged for a Super Stiff wire acting as a safety wire.  A dual lumen introducer was then advanced to the level of the proximal ureter over this and a second ZIPwire was advanced down to the level  of the ureter to the level of the proximal half acting as a working wire, Super Stiff type.  An incision was made at the level of the skin approximately 2 cm in length and the 30-French NephroMax balloon dilation apparatus was carefully positioned  across the  prior nephrostomy tract calix and inflated to a pressure of 20 atmospheres, held for 90 seconds and the sheath was carefully advanced across this using fluoroscopic guidance.  The balloon was then removed and rigid  nephroscopy was performed.   This revealed excellent placement of the sheath within the level of the renal pelvis.  The proximal end renal curl was identified.  It had approximately 2.5 cm of stone attached to it as expected.  Using the Trilogy dual ultrasound pneumatic device, the  energy was applied to the proximal curl stone completely ablating the stone leaving the proximal end of the stent essentially clean of visible stones.  The Foley catheter was then removed and using obturator the same rigid nephroscope was introduced per  urethra to the level of the urinary bladder and the Trilogy device was used to ablate the bladder stone component approximately 3 cm2.  This was then grasped at the level of the urinary bladder and under very careful fluoroscopic vision this was  carefully removed in its entirety and set aside to be given to the patient as she adamantly wants to keep this.  Flexible nephroscopy was then performed after Foley catheter was once again replaced as it was felt the patient likely still had some lower  pole calix stone which she did.  There were 2 foci of stone noted, a larger one approximately 9 mm.  This was grasped and removed in its entirety and set aside.  A separate smaller stone was irrigated out.  All accessible calices once again inspected.   There was complete resolution of all accessible stone fragments larger than one-third mm.  A separate ZIPwire was advanced in an antegrade fashion down to the level of the ureter into the bladder over which a flexible digital ureteroscope was carefully  placed using fluoroscopic guidance and flexible digital ureteroscopy performed the entire length of the left ureter.  This revealed complete resolution of all stone.  No significant mucosal abnormalities of the left ureter.  Next, the remaining safety  wire was exchanged for a KMP type catheter acting as a nephroureteral stent.  This was capped.  The distal end was in the urinary bladder.   The sheath was removed under and the percutaneous tract was reapproximated using a U-stitch 2-0 Vicryl.  A drain  stitch was applied to the level of the stent and externalized using nylon.  A percutaneous dressing was applied.  Anesthesia was terminated.  The patient tolerated the procedure well.  No immediate perioperative complications.  The patient was taken to  postanesthesia care unit in stable condition with plan for observation admission.  Given the favorable profile of surgery, if she does well over the next 1-2 days, we likely try to send her home completely tube-free.  TN/NUANCE  D:01/20/2019 T:01/20/2019 JOB:[id number]

## 2019-01-20 NOTE — OR Nursing (Signed)
Left ureteral stone taken by Dr Clint Lipps

## 2019-01-20 NOTE — OR Nursing (Signed)
Left ureteral stent removed intact by Dr Clint Lipps at 11:18.

## 2019-01-20 NOTE — Brief Op Note (Signed)
01/20/2019  12:05 PM  PATIENT:  Karin Golden  58 y.o. female  PRE-OPERATIVE DIAGNOSIS:  RETAINED LEFT URETERAL STENT, NEPHROLITHIASIS  POST-OPERATIVE DIAGNOSIS:  * No post-op diagnosis entered *  PROCEDURE:  Procedure(s) with comments: NEPHROLITHOTOMY PERCUTANEOUS (Left) - 3 HRS CYSTOSCOPY WITH LITHOLAPAXY (N/A) CYSTOSCOPY WITH STENT REMOVAL (Left) HOLMIUM LASER APPLICATION (Left)  SURGEON:  Surgeon(s) and Role:    Alexis Frock, MD - Primary  PHYSICIAN ASSISTANT:   ASSISTANTS: Basilio Cairo MD   ANESTHESIA:   general  EBL:  100 mL   BLOOD ADMINISTERED:none  DRAINS: 1 - Foley to gravity; 2 - Left nephroureteral stent (capped)   LOCAL MEDICATIONS USED:  NONE  SPECIMEN:  Source of Specimen:  left renal / bladder stone fragments - discard; retained left stent - given to patient  DISPOSITION OF SPECIMEN:  as per above  COUNTS:  YES  TOURNIQUET:  * No tourniquets in log *  DICTATION: .Other Dictation: Dictation Number [id number]  PLAN OF CARE: Admit to inpatient   PATIENT DISPOSITION:  PACU - hemodynamically stable.   Delay start of Pharmacological VTE agent (>24hrs) due to surgical blood loss or risk of bleeding: yes

## 2019-01-21 ENCOUNTER — Encounter (HOSPITAL_COMMUNITY): Payer: Self-pay | Admitting: Urology

## 2019-01-21 DIAGNOSIS — T83192A Other mechanical complication of urinary stent, initial encounter: Secondary | ICD-10-CM | POA: Diagnosis not present

## 2019-01-21 LAB — BASIC METABOLIC PANEL
Anion gap: 7 (ref 5–15)
BUN: 21 mg/dL — ABNORMAL HIGH (ref 6–20)
CO2: 23 mmol/L (ref 22–32)
Calcium: 9 mg/dL (ref 8.9–10.3)
Chloride: 106 mmol/L (ref 98–111)
Creatinine, Ser: 1.1 mg/dL — ABNORMAL HIGH (ref 0.44–1.00)
GFR calc Af Amer: >60 mL/min (ref >60)
GFR calc non Af Amer: 56 mL/min — ABNORMAL LOW (ref >60)
Glucose, Bld: 115 mg/dL — ABNORMAL HIGH (ref 70–99)
Potassium: 4.3 mmol/L (ref 3.5–5.1)
Sodium: 136 mmol/L (ref 135–145)

## 2019-01-21 LAB — HEMOGLOBIN AND HEMATOCRIT, BLOOD
HCT: 38.9 % (ref 36.0–46.0)
Hemoglobin: 12.4 g/dL (ref 12.0–15.0)

## 2019-01-21 MED ORDER — ENSURE ENLIVE PO LIQD
237.0000 mL | Freq: Two times a day (BID) | ORAL | Status: DC
  Administered 2019-01-21 – 2019-01-22 (×3): 237 mL via ORAL

## 2019-01-21 NOTE — Progress Notes (Signed)
Initial Nutrition Assessment  DOCUMENTATION CODES:   Not applicable  INTERVENTION:   Ensure Enlive po BID, each supplement provides 350 kcal and 20 grams of protein  NUTRITION DIAGNOSIS:   Inadequate oral intake related to poor appetite as evidenced by per patient/family report.  GOAL:   Patient will meet greater than or equal to 90% of their needs  MONITOR:   PO intake, Supplement acceptance, Weight trends, Labs  REASON FOR ASSESSMENT:   Malnutrition Screening Tool    ASSESSMENT:   Patient with PMH significant for anxiety, bipolar disorder, depression, and Hep C. Presents this admission for left percutaneous Nephrostolithotomy and Cystolithalopexy.    Pt endorses having a loss in appetite since Christmas due to ongoing nausea (likely from prolong antibiotics). States she typically eats one meal daily that consist of fried chicken, greens, and a grain. She skips breakfast and lunch. She drank Ensure inconsistently when she remembered to purchase it. She tolerated 100% of sausage, egg, and cereal this am. Amendable to Ensure.   Pt endorses a UBW of 165 lb and a recent wt loss of 30 lb in 3 months. Records indicate pt weighed 162 lb on 02/16/18 and 141 lb today (13% wt loss in 11 months, insignificant for time frame). Pt is at risk for malnutrition. Unsure if muscle depletion is from lack of mobility or lack of nutrition.   Medications reviewed and include: senokot Labs reviewed.   NUTRITION - FOCUSED PHYSICAL EXAM:    Most Recent Value  Orbital Region  No depletion  Upper Arm Region  No depletion  Thoracic and Lumbar Region  Unable to assess  Buccal Region  No depletion  Temple Region  No depletion  Clavicle Bone Region  Mild depletion  Clavicle and Acromion Bone Region  Mild depletion  Scapular Bone Region  Unable to assess  Dorsal Hand  No depletion  Patellar Region  Moderate depletion  Anterior Thigh Region  Moderate depletion  Posterior Calf Region  Moderate  depletion  Edema (RD Assessment)  None  Hair  Reviewed  Eyes  Reviewed  Mouth  Reviewed  Skin  Reviewed  Nails  Reviewed     Diet Order:   Diet Order            Diet regular Room service appropriate? Yes; Fluid consistency: Thin  Diet effective now              EDUCATION NEEDS:   Education needs have been addressed  Skin:  Skin Assessment: Skin Integrity Issues: Skin Integrity Issues:: Incisions Incisions: closed back   Last BM:  2/27  Height:   Ht Readings from Last 1 Encounters:  01/20/19 5\' 7"  (1.702 m)    Weight:   Wt Readings from Last 1 Encounters:  01/20/19 64 kg    Ideal Body Weight:  61.4 kg  BMI:  Body mass index is 22.1 kg/m.  Estimated Nutritional Needs:   Kcal:  1700-1900 kcal  Protein:  85-100 grams  Fluid:  >/= 1.7 L/day   Mariana Single RD, LDN Clinical Nutrition Pager # - [phone]

## 2019-01-21 NOTE — Progress Notes (Signed)
Urology Progress Note   1 Day Post-Op s/p Left PCNL for retained stent Ongoing left flank pain overnight, wraps around towards central abdomen Flank site looks okay, no hematoma Foley draining yellow urine with slight pink tinge Lab work stable, Afebrile  Subjective: NAEON.   Objective: Vital signs in last 24 hours: Temp:  [97.5 F (36.4 C)-98.7 F (37.1 C)] 98.7 F (37.1 C) (02/27 0736) Pulse Rate:  [63-95] 72 (02/27 0736) Resp:  [12-20] 20 (02/27 0736) BP: (126-152)/(68-107) 144/82 (02/27 0736) SpO2:  [92 %-100 %] 95 % (02/27 0736) Weight:  [64 kg] 64 kg (02/26 0802)  Intake/Output from previous day: 02/26 0701 - 02/27 0700 In: 4633.7 [P.O.:1440; I.V.:3193.7] Out: 2050 [Urine:1950; Blood:100] Intake/Output this shift: No intake/output data recorded.  Physical Exam:  General: Alert and oriented CV: RRR Lungs: Clear Abdomen: Soft, appropriately tender. Left flank tender to palpation. Site hemostatic GU: Foley in place draining clear yellow urine Ext: NT, No erythema  Lab Results: Recent Labs    01/20/19 1234 01/21/19 0357  HGB 12.7 12.4  HCT 40.2 38.9   BMET Recent Labs    01/21/19 0357  NA 136  K 4.3  CL 106  CO2 23  GLUCOSE 115*  BUN 21*  CREATININE 1.10*  CALCIUM 9.0     Studies/Results: Dg C-arm 1-60 Min-no Report  Result Date: 01/20/2019 Fluoroscopy was utilized by the requesting physician.  No radiographic interpretation.    Assessment/Plan:  58 y.o. female s/p Left PCNL for retained stent.  Overall doing well post-op.   - [place], regular diet - Discontinue foley catheter, follow up TOV - Ambulate TID - Will continue to monitor flank pain today - Likely stay through today. Plan to remove nephroureteral stent tomorrow and if doing well discharge to home   Dispo: stable, anticipate discharge tomorrow   LOS: 1 day   Fredricka Bonine 01/21/2019, 7:47 AM

## 2019-01-21 NOTE — Plan of Care (Signed)
  Problem: Education: Goal: Knowledge of General Education information will improve Description Including pain rating scale, medication(s)/side effects and non-pharmacologic comfort measures Outcome: Progressing   Problem: Education: Goal: Required Educational Video(s) Outcome: Progressing   Problem: Clinical Measurements: Goal: Postoperative complications will be avoided or minimized Outcome: Progressing   Problem: Skin Integrity: Goal: Demonstration of wound healing without infection will improve Outcome: Progressing   

## 2019-01-22 DIAGNOSIS — T83192A Other mechanical complication of urinary stent, initial encounter: Secondary | ICD-10-CM | POA: Diagnosis not present

## 2019-01-22 MED ORDER — SENNOSIDES-DOCUSATE SODIUM 8.6-50 MG PO TABS: 1.0000 | ORAL_TABLET | Freq: Every day | ORAL | 0 refills | Status: DC

## 2019-01-22 MED ORDER — TRAMADOL HCL 50 MG PO TABS: 50.0000 mg | ORAL_TABLET | Freq: Four times a day (QID) | ORAL | 0 refills | Status: DC | PRN

## 2019-01-22 MED FILL — traMADol HCL 50 MG TABS: 50 | 2 days supply | Qty: 10 | Fill #0

## 2019-01-22 NOTE — Progress Notes (Signed)
D/C med rec clarified w/ Dr Natividad Brood. Pt does not need cipro or zofran at d/c. This was clarified w/ pt and a note was written on her d/c med rec by this Probation officer reflecting this. D/C instructions reviewed w/ pt who verbalizes understanding and all questions answered. Pt d/c in w/c to family's car by this Probation officer. Pt in possession of d/c packet, scripts, and all personal belongings.

## 2019-01-22 NOTE — Treatment Plan (Signed)
Left nephroureteral stent removed at bedside this morning Will plan to ensure no new renal colic this morning Anticipate discharge home late morning/afternoon

## 2019-01-22 NOTE — Discharge Summary (Signed)
[facility] Discharge Summary  Admit date: 01/20/2019  Discharge date and time: 01/22/19   Discharge to: Home  Discharge Service: Urology  Discharge Attending Physician:  Dr. Tresa Moore  Discharge  Diagnoses: <principal problem not specified>  Secondary Diagnosis: Active Problems:   Nephrolithiasis   OR Procedures: Procedure(s): NEPHROLITHOTOMY PERCUTANEOUS CYSTOSCOPY WITH LITHOLAPAXY CYSTOSCOPY WITH STENT REMOVAL DIAGNOSTIC URETEROSCOPY 01/20/2019   Ancillary Procedures: None   Discharge Day Services: The patient was seen and examined by the Urology team both in the morning and immediately prior to discharge.  Vital signs and laboratory values were stable and within normal limits.  The physical exam was benign and unchanged and all surgical wounds were examined.  Discharge instructions were explained and all questions answered.  Subjective  No acute events overnight. Pain Controlled. No fever or chills.  Objective Patient Vitals for the past 8 hrs:  BP Temp Temp src Pulse Resp SpO2  01/22/19 0446 136/89 98.6 F (37 C) Oral 75 18 95 %   No intake/output data recorded.  General Appearance:        No acute distress Lungs:                       Normal work of breathing on room air Heart:                                Regular rate and rhythm Abdomen:                         Soft, non-tender, non-distended. Left flank incision (prior PCN) site hemostatic and ocvered with abd pad and tegaderm. [place] stent has been removed.  Extremities:                      Warm and well perfused   Hospital Course:  The patient underwent a left PCNL and cystolithalopaxy for a retained ureteral stent on 01/20/2019.  The patient tolerated the procedure well, was extubated in the OR, and afterwards was taken to the PACU for routine post-surgical care. When stable the patient was transferred to the floor.   The patient did well postoperatively.  The patient's diet was slowly advanced and at the  time of discharge was tolerating a regular diet.  The patient was discharged home 2 Days Post-Op, at which point was tolerating a regular solid diet, was able to void spontaneously, have adequate pain control with P.O. pain medication, and could ambulate without difficulty. Her [place] nephroureteral stent was removed at bedside prior to discharge.   The patient will follow up with [place] for post op check.   Condition at Discharge: Improved  Discharge Medications:

## 2019-02-08 ENCOUNTER — Telehealth: Payer: Self-pay | Admitting: Critical Care Medicine

## 2019-02-08 NOTE — Telephone Encounter (Signed)
Pt wants a sooner appt,  Can we make it for this week and triple book??

## 2019-02-08 NOTE — Telephone Encounter (Signed)
Patient wants to speak you said it was very important that you call her back

## 2019-02-09 ENCOUNTER — Encounter: Payer: Self-pay | Admitting: Critical Care Medicine

## 2019-02-09 ENCOUNTER — Other Ambulatory Visit: Payer: Self-pay

## 2019-02-09 ENCOUNTER — Ambulatory Visit: Payer: Self-pay | Attending: Critical Care Medicine | Admitting: Critical Care Medicine

## 2019-02-09 VITALS — BP 132/69 | HR 84 | Temp 98.0°F | Resp 18 | Ht 67.0 in | Wt 147.0 lb

## 2019-02-09 DIAGNOSIS — Z5900 Homelessness unspecified: Secondary | ICD-10-CM

## 2019-02-09 DIAGNOSIS — N12 Tubulo-interstitial nephritis, not specified as acute or chronic: Secondary | ICD-10-CM

## 2019-02-09 DIAGNOSIS — Z87442 Personal history of urinary calculi: Secondary | ICD-10-CM

## 2019-02-09 DIAGNOSIS — F1011 Alcohol abuse, in remission: Secondary | ICD-10-CM

## 2019-02-09 DIAGNOSIS — N132 Hydronephrosis with renal and ureteral calculous obstruction: Secondary | ICD-10-CM
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
## 2019-02-09 NOTE — Assessment & Plan Note (Signed)
History of nephrolithiasis with left hydronephrosis and failed stent placement now all is resolved.  The stent has been removed and the hydronephrosis is now resolved.  Plan will be to watch expectantly at this time  There is no additional medication intervention needed

## 2019-02-09 NOTE — Patient Instructions (Signed)
No medication refills are needed  A letter was written regarding your current disability status  We will see if we can access bus passes for you and also connections for potential housing  Return in the next 6 to 8 weeks for primary care visit to establish

## 2019-02-09 NOTE — Assessment & Plan Note (Signed)
The patient unfortunately is drifting back into homelessness  We will connect patient with social services to see what options may exist for this patient

## 2019-02-09 NOTE — Assessment & Plan Note (Signed)
The patient is no longer taking alcohol at this time we will watch this expectantly

## 2019-02-09 NOTE — Assessment & Plan Note (Signed)
Hydronephrosis has now resolved

## 2019-02-11 ENCOUNTER — Telehealth: Payer: Self-pay

## 2019-02-11 NOTE — Telephone Encounter (Signed)
Call placed to patient at request of [place], [place].  The patient is experiencing homelessness and is currently staying with her brother.  She stated that she would like a place of her own but has no income. Disability application pending.  She said that her niece is trying to help her to find an apartment. The option of [facility] was discussed and she said that she can't be in a shelter now because she is "trying to heal."  She said that she has the phone number for [facility] as well as the [facility] and she understands what services the [facility] provides.    Explained to her that [facility] has LCSW at the clinic if she would like to talk to her and she said that she would.  She would like the SW to call her   Informed her that Christa See, LCSW would be notified of her request.

## 2019-02-16 ENCOUNTER — Ambulatory Visit: Payer: Self-pay | Admitting: Critical Care Medicine

## 2019-02-23 ENCOUNTER — Telehealth: Payer: Self-pay | Admitting: Licensed Clinical Social Worker

## 2019-02-23 NOTE — Telephone Encounter (Signed)
LCSWA attempted to follow up with pt on behavioral health and psychosocial stressors/needs. LCSWA left a message for a return call.

## 2019-02-26 ENCOUNTER — Telehealth: Payer: Self-pay | Admitting: Licensed Clinical Social Worker

## 2019-02-26 NOTE — Telephone Encounter (Signed)
LCSWA attempted to follow up with pt on behavioral health and psychosocial stressors/needs. LCSWA left a message for a return call.
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
## 2019-05-06 ENCOUNTER — Other Ambulatory Visit: Payer: Self-pay

## 2019-05-06 ENCOUNTER — Ambulatory Visit: Payer: Medicaid Other | Attending: Family Medicine | Admitting: Family Medicine

## 2019-05-06 ENCOUNTER — Encounter: Payer: Self-pay | Admitting: Family Medicine

## 2019-05-06 DIAGNOSIS — M545 Low back pain, unspecified: Secondary | ICD-10-CM

## 2019-05-06 DIAGNOSIS — Z5900 Homelessness unspecified: Secondary | ICD-10-CM

## 2019-05-06 DIAGNOSIS — Z87442 Personal history of urinary calculi: Secondary | ICD-10-CM | POA: Diagnosis not present

## 2019-05-06 MED ORDER — IBUPROFEN 600 MG PO TABS: 600.0000 mg | ORAL_TABLET | Freq: Three times a day (TID) | ORAL | 0 refills | Status: AC | PRN

## 2019-05-06 MED ORDER — AMOXICILLIN-POT CLAVULANATE 500-125 MG PO TABS: 1.0000 | ORAL_TABLET | Freq: Two times a day (BID) | ORAL | 0 refills | Status: DC

## 2019-05-06 NOTE — Progress Notes (Signed)
Virtual Visit via Telephone Note  I connected with Kimberly [name]  on 05/06/19 at  3:30 PM EDT by telephone and verified that I am speaking with the correct person using two identifiers.   I discussed the limitations, risks, security and privacy concerns of performing an evaluation and management service by telephone and the availability of in person appointments. I also discussed with the patient that there may be a patient responsible charge related to this service. The patient expressed understanding and agreed to proceed.  Patient Location: At a friend's home Provider Location: Office Others participating in call: Call initiated by Emilio Aspen, CMA   History of Present Illness:      58 year old female who was seen here in the office on 02/09/2019 by Dr. Joya Gaskins status post hospital discharge in December of last year for left-sided flank pain and dysuria and patient was found to have severe pyelonephritis and left-sided hydronephrosis along with multiple kidney stones.  Patient had a ureteral stent placed in 2018 but did not have insurance and therefore did not follow-up with urology to have the stent removed. She has since had removal of stent in Feb of this year.       She reports recent onset of left sided mid back pain and she is concerned that she might have a urinary tract infection. She does feel as if she is having some urgency of urination. No burning. Pain is dull and aching and ranges from a 6-8 on a 0-10 scale. Symptoms have worsened over the past week. No fever or chills. Mild lower abdominal pressure at the end of urination. No nausea or  Vomiting. No dizziness.  Occasional dull headache which she thinks is stress related.  She has recently been under a lot of stress and she is currently homeless and has been staying with friends.    Past Medical History:  Diagnosis Date  . Anxiety   . Bipolar 1 disorder ([place])   . Depression   . Hepatitis C   . Left nephrolithiasis     . Left nephrolithiasis     Past Surgical History:  Procedure Laterality Date  . ACROMIO-CLAVICULAR JOINT REPAIR Left 02/24/2015   Procedure: LEFT ACROMIO-CLAVICULAR JOINT RECONSTRUCTION;  Surgeon: Leandrew Koyanagi, MD;  Location: [facility];  Service: Orthopedics;  Laterality: Left;  . CYSTOSCOPY W/ URETERAL STENT PLACEMENT Left 07/22/2017   Procedure: CYSTOSCOPY WITH RETROGRADE PYELOGRAM/URETERAL STENT PLACEMENT;  Surgeon: Alexis Frock, MD;  Location: [facility];  Service: Urology;  Laterality: Left;  . CYSTOSCOPY W/ URETERAL STENT REMOVAL Left 01/20/2019   Procedure: CYSTOSCOPY WITH STENT REMOVAL;  Surgeon: Alexis Frock, MD;  Location: [facility];  Service: Urology;  Laterality: Left;  . CYSTOSCOPY WITH LITHOLAPAXY N/A 01/20/2019   Procedure: CYSTOSCOPY WITH LITHOLAPAXY;  Surgeon: Alexis Frock, MD;  Location: [facility];  Service: Urology;  Laterality: N/A;  . IR NEPHROSTOMY PLACEMENT LEFT  11/18/2018  . IR NEPHROSTOMY TUBE CHANGE  12/18/2018  . NEPHROLITHOTOMY Left 01/20/2019   Procedure: NEPHROLITHOTOMY PERCUTANEOUS;  Surgeon: Alexis Frock, MD;  Location: [facility];  Service: Urology;  Laterality: Left;  3 HRS  . ORIF MANDIBULAR FRACTURE N/A 03/22/2016   Procedure: OPEN REDUCTION INTERNAL FIXATION (ORIF) MANDIBULAR FRACTURE;  Surgeon: Melissa Montane, MD;  Location: [facility];  Service: ENT;  Laterality: N/A;  . URETEROSCOPY Left 01/20/2019   Procedure: DIAGNOSTIC URETEROSCOPY;  Surgeon: Alexis Frock, MD;  Location[facility];  Service: Urology;  Laterality: Left;    History reviewed. No pertinent family history.  Social History   Tobacco Use  . Smoking status: Current Every Day Smoker    Packs/day: 1.00    Types: Cigarettes  . Smokeless tobacco: Never Used  Substance Use Topics  . Alcohol use: Not Currently    Alcohol/week: 1.0 standard drinks    Types: 1 Cans of beer per week    Comment: occ  . Drug use: Not Currently    Comment: Pt denies substance use during this TTS assessment on 08-25-14 UDS + for THC      No Known Allergies     Observations/Objective: No vital signs or physical exam conducted as visit was done via telephone  Assessment and Plan: 1. History of nephrolithiasis Patient was asked to come into the office and give same for UA and urine will also be sent for culture. Rx provided for Augmentin while culture is pending.  - Urine Culture; Future - amoxicillin-clavulanate (AUGMENTIN) 500-125 MG tablet; Take 1 tablet (500 mg total) by mouth 2 (two) times daily. Take after a meal  Dispense: 14 tablet; Refill: 0  2. Acute left-sided low back pain without sciatica Patient with left low back pain and history of recurrent UTI and kidney stones. Patient has been asked to come into the office to give UA and urine will be sent for culture. Rx for ibuprofen as needed for pain but patient should go to urgent care/ED if her symptoms acutely worsen.  - Urine Culture; Future - ibuprofen (ADVIL) 600 MG tablet; Take 1 tablet (600 mg total) by mouth every 8 (eight) hours as needed. For back pain  Dispense: 60 tablet; Refill: 0 - amoxicillin-clavulanate (AUGMENTIN) 500-125 MG tablet; Take 1 tablet (500 mg total) by mouth 2 (two) times daily. Take after a meal  Dispense: 14 tablet; Refill: 0  3. Homelessness Patient was encouraged to contact the [facility] which she stated she was familiar with and will also notify office nurse coordinator to follow-up with patient and [facility] for resources for patient.  Follow Up Instructions:Return if symptoms worsen or fail to improve, for f/u in 1-2 week if not better and ED if symptoms worsen.    I discussed the assessment and treatment plan with the patient. The patient was provided an opportunity to ask questions and all were answered. The patient agreed with the plan and demonstrated an understanding of the instructions.   The patient was advised to call back or seek an in-person evaluation if the symptoms worsen or if the condition fails to improve as  anticipated.  I provided 11 minutes of non-face-to-face time during this encounter.   Antony Blackbird, MD

## 2019-05-07 ENCOUNTER — Other Ambulatory Visit: Payer: Medicaid Other

## 2019-05-13 ENCOUNTER — Telehealth (INDEPENDENT_AMBULATORY_CARE_PROVIDER_SITE_OTHER): Payer: Self-pay

## 2019-05-13 NOTE — Telephone Encounter (Signed)
Call placed to patient to discuss housing needs. She explained that she has been homeless, staying " here and there."  She is currently staying with her brother. She said that she that she has been tying to obtain disability and has been told that she can work part time. She said that she has been to the [facility] and they are closed. Informed her that this CM is not aware that they have been closed.   Explained to her that [facility] may be able to assist her through the county COVID 19 screening program for entry into a shelter and possibly permanent housing. The patient was agreeable to a referral being placed.informed her that a representative from the agency will contact her or this CM will call back if they are not accepting referrals.   Call placed to [facility] and referral was placed. She said that they are still accepting referrals. She will need to confirm shelter availability  - either [facility] [facility] or [facility] [employer] and then she will reach out to the patient.

## 2019-05-16 ENCOUNTER — Encounter: Payer: Self-pay | Admitting: Family Medicine

## 2019-05-17 NOTE — Telephone Encounter (Signed)
Call placed to [facility]. She explained that she has not spoken to the patient yet as there have not been any openings at the shelter or at the motel. She is off today but her co-worker is scheduled to reach out to the patient today.

## 2019-10-07 ENCOUNTER — Ambulatory Visit: Payer: Medicaid Other

## 2020-03-27 ENCOUNTER — Ambulatory Visit (INDEPENDENT_AMBULATORY_CARE_PROVIDER_SITE_OTHER): Payer: Medicaid Other | Admitting: Internal Medicine

## 2020-03-27 ENCOUNTER — Other Ambulatory Visit: Payer: Self-pay

## 2020-03-27 ENCOUNTER — Other Ambulatory Visit (HOSPITAL_COMMUNITY)
Admission: RE | Admit: 2020-03-27 | Discharge: 2020-03-27 | Disposition: A | Discharge status: Home / Self Care | Payer: Medicaid Other | Source: Ambulatory Visit | Attending: Internal Medicine | Admitting: Internal Medicine

## 2020-03-27 ENCOUNTER — Encounter: Payer: Self-pay | Admitting: Internal Medicine

## 2020-03-27 VITALS — BP 120/74 | HR 80 | Temp 98.0°F | Ht 67.0 in | Wt 145.7 lb

## 2020-03-27 DIAGNOSIS — R310 Gross hematuria: Secondary | ICD-10-CM

## 2020-03-27 DIAGNOSIS — Z124 Encounter for screening for malignant neoplasm of cervix: Secondary | ICD-10-CM | POA: Diagnosis not present

## 2020-03-27 DIAGNOSIS — R768 Other specified abnormal immunological findings in serum: Secondary | ICD-10-CM | POA: Diagnosis not present

## 2020-03-27 DIAGNOSIS — N898 Other specified noninflammatory disorders of vagina: Secondary | ICD-10-CM | POA: Diagnosis not present

## 2020-03-27 DIAGNOSIS — Z5900 Homelessness unspecified: Secondary | ICD-10-CM

## 2020-03-27 DIAGNOSIS — B9689 Other specified bacterial agents as the cause of diseases classified elsewhere: Secondary | ICD-10-CM | POA: Insufficient documentation

## 2020-03-27 DIAGNOSIS — Z1231 Encounter for screening mammogram for malignant neoplasm of breast: Secondary | ICD-10-CM | POA: Insufficient documentation

## 2020-03-27 DIAGNOSIS — F17210 Nicotine dependence, cigarettes, uncomplicated: Secondary | ICD-10-CM | POA: Diagnosis not present

## 2020-03-27 DIAGNOSIS — N760 Acute vaginitis: Secondary | ICD-10-CM | POA: Insufficient documentation

## 2020-03-27 DIAGNOSIS — F17200 Nicotine dependence, unspecified, uncomplicated: Secondary | ICD-10-CM

## 2020-03-27 LAB — POCT URINALYSIS DIPSTICK
Bilirubin, UA: NEGATIVE
Glucose, UA: NEGATIVE
Ketones, UA: NEGATIVE
Nitrite, UA: NEGATIVE
Protein, UA: NEGATIVE
Spec Grav, UA: 1.015 (ref 1.010–1.025)
Urobilinogen, UA: 0.2 E.U./dL
pH, UA: 6.5 (ref 5.0–8.0)

## 2020-03-27 NOTE — Assessment & Plan Note (Signed)
Has >30 pack year smoking hx. Currently 1ppd smoker. Not interested in tobacco cessation at this time.  - C/w encourage tobacco cessation

## 2020-03-27 NOTE — Assessment & Plan Note (Signed)
On exam, noted to have blood-tinged vaginal discharge. Possibly due to hematuria but noted quite deep in vaginal vault. No recent sexual encounter per patient. Will test for vaginitis.  - Wet prep

## 2020-03-27 NOTE — Progress Notes (Signed)
CC: Bloody urine  HPI: Kimberly [name] is a 59 y.o. with PMH listed below presenting with complaint of bloody urine. Please see problem based assessment and plan for further details.  Past Medical History:  Diagnosis Date  . Anxiety   . Bipolar 1 disorder ([place])   . Depression   . Hepatitis C   . Left nephrolithiasis   . Left nephrolithiasis    Social History Currently lives with aunt. Previous history of unstable housing and incarceration history. On disability at the moment. Smokes 1 ppd (>30 pack year hx). Drinks beer occasionally. No illicit substance use.  Family History Mother passed in [id number] from complications of cervical cancer. No other significant family history  Review of Systems: Review of Systems  Constitutional: Negative for chills, fever and malaise/fatigue.  Eyes: Negative for blurred vision.  Respiratory: Negative for shortness of breath.   Cardiovascular: Negative for chest pain, palpitations and leg swelling.  Gastrointestinal: Negative for constipation, diarrhea, nausea and vomiting.  Genitourinary: Positive for hematuria. Negative for dysuria, flank pain and urgency.  Musculoskeletal: Positive for back pain.  Neurological: Negative for dizziness, tingling, sensory change and weakness.     Physical Exam: Vitals:   03/27/20 1429  BP: 120/74  Pulse: 80  Temp: 98 F (36.7 C)  TempSrc: Oral  SpO2: 98%  Weight: 145 lb 11.2 oz (66.1 kg)  Height: 5\' 7"  (1.702 m)   Physical Exam  Constitutional: She is oriented to person, place, and time. She appears well-developed and well-nourished. No distress.  HENT:  Mouth/Throat: Oropharynx is clear and moist.  Cardiovascular: Normal rate, regular rhythm, normal heart sounds and intact distal pulses.  No murmur heard. Respiratory: Effort normal and breath sounds normal. She has no wheezes. She has no rales.  GI: Soft. Bowel sounds are normal. She exhibits no distension. There is no abdominal tenderness.  No  flank tenderness  Genitourinary:    Vaginal discharge (white, slightly blood tinged vaginal discharge) present.   Musculoskeletal:        General: No edema. Normal range of motion.  Neurological: She is alert and oriented to person, place, and time.  Skin: Skin is warm and dry.     Assessment & Plan:   Homeless Previously had history of unstable housing.   - Referral to care management  Tobacco use disorder Has >30 pack year smoking hx. Currently 1ppd smoker. Not interested in tobacco cessation at this time.  - C/w encourage tobacco cessation  Gross hematuria [name] is a 59 yo F w/ PMH of recurrent nephrolithiasis s/p nephrostomy, prior stent now removed, presenting to [facility] w/ complaint of blood in her urine. She was in her usual state of health until about 1.5 weeks ago when she noticed pink-colored urine without any obvious inciting events. She mentions prior hx of recurrent nephrolithiasis requiring urological procedures. She denies any acute back/flank pain, dysuria, urinary frequency or urgency. She denies any fevers, chills, nausea, vomiting.  A/P [name] presents w/ gross hematuria. Likely due to nephrolithiasis vs bladder ca. Chart review shows complicated urological hx with multiple procedures. Physical exam without evidence of acute renal colic. Would be high risk for bladder ca due to significant smoking hx but possible complication of prior urological surgical hx. POC UA shows significant blood and mild leuks, no nitrates. UTI low on differential. Has not yet had f/u with urology since discharge.  - Referral to urology made - F/u urine culture - Cbc  Vaginal discharge On exam, noted to have blood-tinged vaginal  discharge. Possibly due to hematuria but noted quite deep in vaginal vault. No recent sexual encounter per patient. Will test for vaginitis.  - Wet prep  Encounter for Papanicolaou smear for cervical cancer screening Overdue for pap smear. Mentions having  one done while incarcerated but was never told the results. Has family history of mother who previously passed from complications of cervical cancer. She denies any high risk sexual activity, recent weight loss, vaginal discharge.   - Pap smear performed today  Encounter for screening mammogram for breast cancer Mentions no prior hx of mammogram. Denies family hx of breast/ovarian ca. Agreeable to getting it done. Mammogram order placed.  - Mammogram  Hepatitis C antibody positive in blood Mentions hx of positive hep C antibody. She states wellness program while incarcerated included screening and she tested positive. She mentions not having received any treatment in the past. Denies any prior hx of IV drug use. Chart review confirms positive Hep C antibody from 2017.   - Will check hep C viral load - Will need referral to ID for treatment if she did not clear the virus   Patient discussed with Dr. Angelia Mould   -Gilberto Better, PGY2 [facility] Pager: [phone]

## 2020-03-27 NOTE — Assessment & Plan Note (Signed)
Previously had history of unstable housing.   - Referral to care management

## 2020-03-27 NOTE — Patient Instructions (Addendum)
Dear [name],  Thank you for allowing [place] to provide your care today. Today we discussed the blood in your urine    I have ordered hepatitis C, cbc, cmp, pap smear labs for you. I will call if any are abnormal.    Today we made no changes to your medications:    Please follow-up in 3 months.    Should you have any questions or concerns please call the internal medicine clinic at [phone].    Thank you for choosing [facility].   Preventing Cervical Cancer Cervical cancer is cancer that grows on the cervix. The cervix is at the bottom of the uterus. It connects the uterus to the vagina. The uterus is where a baby develops during pregnancy. Cancer occurs when cells become abnormal and start to grow out of control. If cervical cancer is not found early, it can spread and become dangerous. Cervical cancer cannot always be prevented, but you can take steps to lower your risk of developing this condition. How can this condition affect me? Cervical cancer grows slowly and may not cause any symptoms at first. Over time, the cancer can grow deep into the cervix tissue and spread to other areas. This may take years, and it may happen without you knowing about it. If it is found early, cervical cancer can be treated effectively. If the cancer has grown deep into your cervix or has spread, it will be more difficult to treat. Most cases of cervical cancer are caused by an STI (sexually transmitted infection) called human papillomavirus (HPV). One way to reduce your risk of cervical cancer is to take steps to avoid infection with the HPV virus. Getting regular Pap tests is also important because this can help identify changes in cells that could lead to cancer. Your chances of getting this disease can also be reduced by making certain lifestyle changes. What can increase my risk? You are more likely to develop this condition if:  You have certain things in your sexual history, such  as: ? Having a sexually transmitted viral infection. These include chlamydia and herpes. ? Having more than one sexual partner, or having sex with someone who has more than one sexual partner. ? Not using condoms during sex. ? Having been sexually active before the age of 48.  Your mother took a medicine called diethylstilbestrol (DES) while pregnant with you, causing you to be exposed to this medicine before birth.  Your mother or sister has had cervical cancer.  You are between the ages of 34-50.  You have or have had certain other medical conditions, such as: ? Previous cancer of the vagina or vulva. ? A weakened body defense system (immune system). ? A history of dysplasia of the cervix.  You use oral contraceptives, also called birth control pills.  You smoke or breathe in secondhand smoke. What actions can I take to prevent cervical cancer? Preventing HPV infection   Ask your health care provider about getting the HPV vaccine. If you are 64 years old or younger, you may need to get this vaccine, which is given in three doses over 6 months. This vaccine protects against the types of HPV that could cause cancer.  Limit the number of people you have sex with. Also avoid having sex with people who have had many sex partners.  Use a latex condom every time you have sex. Getting Pap tests Get Pap tests regularly, starting at age 43. Talk with your health care provider about  how often you need these tests. Having regular Pap tests will help identify changes in cells that could lead to cancer. Steps can then be taken to prevent cancer from developing.  Most women who are 24?57 years of age should have a Pap test every 3 years.  Most women who are 43?57 years of age should have a Pap test in combination with an HPV test every 5 years.  Women with a higher risk of cervical cancer, such as those with a weakened immune system or those who were exposed to DES medicine before birth, may  need more frequent testing. Making other lifestyle changes   Do not use any products that contain nicotine or tobacco, such as cigarettes, e-cigarettes, and chewing tobacco. If you need help quitting, ask your health care provider.  Eat a healthy diet that includes at least 5 servings of fruits and vegetables every day.  Lose weight if you are overweight. Where to find support Talk with your health care provider, school nurse, or local health department for guidance about screening and vaccination. Some children and teens may be able to get the HPV vaccine free of charge through the U.S. government's Vaccines for Children [facility]) program. Other places that provide vaccinations include:  Public health clinics. Check with your local health department.  [place], where you would pay only what you can afford. To find one near you, check this website: http://lyons.com/  [place]. These are part of a program for Medicare and Medicaid patients who live in rural areas. The National Breast and Cervical Cancer Early Detection Program also provides breast and cervical cancer screenings and diagnostic services to low-income, uninsured, and underinsured women. Cervical cancer can be passed down through families. Talk with your health care provider or a genetic counselor to learn more about genetic testing for cancer. Where to find more information Learn more about cervical cancer from:  [employer] of Gynecology: www.acog.org  American Cancer Society: www.cancer.org  Centers for Disease Control and Prevention: http://www.wolf.info/ Contact a health care provider if you have:  Pelvic pain.  Unusual discharge or bleeding from your vagina. Summary  Cervical cancer is cancer that grows on the cervix. The cervix is at the bottom of the uterus.  Ask your health care provider about getting the HPV vaccine.  Be sure to get regular Pap tests as recommended by  your health care provider.  See your health care provider right away if you have any pelvic pain or unusual discharge or bleeding from your vagina. This information is not intended to replace advice given to you by your health care provider. Make sure you discuss any questions you have with your health care provider. Document Revised: 06/14/2019 Document Reviewed: 06/14/2019 Elsevier Patient Education  [place].

## 2020-03-27 NOTE — Assessment & Plan Note (Signed)
Overdue for pap smear. Mentions having one done while incarcerated but was never told the results. Has family history of mother who previously passed from complications of cervical cancer. She denies any high risk sexual activity, recent weight loss, vaginal discharge.   - Pap smear performed today

## 2020-03-27 NOTE — Assessment & Plan Note (Addendum)
Mentions hx of positive hep C antibody. She states wellness program while incarcerated included screening and she tested positive. She mentions not having received any treatment in the past. Denies any prior hx of IV drug use. Chart review confirms positive Hep C antibody from 2017.   - Will check hep C viral load - Will need referral to ID for treatment if she did not clear the virus  Addendum: Hep C viral load @ 11,000,000 iu/ml. Referral to ID placed.

## 2020-03-27 NOTE — Assessment & Plan Note (Signed)
Kimberly [name] is a 59 yo F w/ PMH of recurrent nephrolithiasis s/p nephrostomy, prior stent now removed, presenting to [facility] w/ complaint of blood in her urine. She was in her usual state of health until about 1.5 weeks ago when she noticed pink-colored urine without any obvious inciting events. She mentions prior hx of recurrent nephrolithiasis requiring urological procedures. She denies any acute back/flank pain, dysuria, urinary frequency or urgency. She denies any fevers, chills, nausea, vomiting.  A/P Kimberly [name] presents w/ gross hematuria. Likely due to nephrolithiasis vs bladder ca. Chart review shows complicated urological hx with multiple procedures. Physical exam without evidence of acute renal colic. Would be high risk for bladder ca due to significant smoking hx but possible complication of prior urological surgical hx. POC UA shows significant blood and mild leuks, no nitrates. UTI low on differential. Has not yet had f/u with urology since discharge.  - Referral to urology made - F/u urine culture - Cbc

## 2020-03-27 NOTE — Assessment & Plan Note (Addendum)
Mentions no prior hx of mammogram. Denies family hx of breast/ovarian ca. Agreeable to getting it done. Mammogram order placed.  - Mammogram

## 2020-03-28 LAB — CERVICOVAGINAL ANCILLARY ONLY
Bacterial Vaginitis (gardnerella): POSITIVE — AB
Candida Glabrata: NEGATIVE
Candida Vaginitis: NEGATIVE
Chlamydia: NEGATIVE
Comment: NEGATIVE
Comment: NEGATIVE
Comment: NEGATIVE
Comment: NEGATIVE
Comment: NEGATIVE
Comment: NORMAL
Neisseria Gonorrhea: NEGATIVE
Trichomonas: NEGATIVE

## 2020-03-28 NOTE — Progress Notes (Signed)
Internal Medicine Clinic Attending ° °Case discussed with Dr. Lee at the time of the visit.  We reviewed the resident’s history and exam and pertinent patient test results.  I agree with the assessment, diagnosis, and plan of care documented in the resident’s note.  °

## 2020-03-29 LAB — CYTOLOGY - PAP
Comment: NEGATIVE
Diagnosis: NEGATIVE
High risk HPV: NEGATIVE

## 2020-03-29 LAB — HIV ANTIBODY (ROUTINE TESTING W REFLEX): HIV Screen 4th Generation wRfx: NONREACTIVE

## 2020-03-29 LAB — CBC WITH DIFFERENTIAL/PLATELET
Basophils Absolute: 0.1 10*3/uL (ref 0.0–0.2)
Basos: 1 %
EOS (ABSOLUTE): 0.1 10*3/uL (ref 0.0–0.4)
Eos: 2 %
Hematocrit: 38.2 % (ref 34.0–46.6)
Hemoglobin: 13.4 g/dL (ref 11.1–15.9)
Immature Grans (Abs): 0 10*3/uL (ref 0.0–0.1)
Immature Granulocytes: 0 %
Lymphocytes Absolute: 2.2 10*3/uL (ref 0.7–3.1)
Lymphs: 24 %
MCH: 35.7 pg — ABNORMAL HIGH (ref 26.6–33.0)
MCHC: 35.1 g/dL (ref 31.5–35.7)
MCV: 102 fL — ABNORMAL HIGH (ref 79–97)
Monocytes Absolute: 0.8 10*3/uL (ref 0.1–0.9)
Monocytes: 8 %
Neutrophils Absolute: 5.9 10*3/uL (ref 1.4–7.0)
Neutrophils: 65 %
Platelets: 239 10*3/uL (ref 150–450)
RBC: 3.75 x10E6/uL — ABNORMAL LOW (ref 3.77–5.28)
RDW: 12 % (ref 11.7–15.4)
WBC: 9 10*3/uL (ref 3.4–10.8)

## 2020-03-29 LAB — CMP14 + ANION GAP
ALT: 32 IU/L (ref 0–32)
AST: 37 IU/L (ref 0–40)
Albumin/Globulin Ratio: 1.3 (ref 1.2–2.2)
Albumin: 4.3 g/dL (ref 3.8–4.9)
Alkaline Phosphatase: 109 IU/L (ref 39–117)
Anion Gap: 20 mmol/L — ABNORMAL HIGH (ref 10.0–18.0)
BUN/Creatinine Ratio: 22 (ref 9–23)
BUN: 15 mg/dL (ref 6–24)
Bilirubin Total: <0.2 mg/dL (ref 0.0–1.2)
CO2: 16 mmol/L — ABNORMAL LOW (ref 20–29)
Calcium: 9.8 mg/dL (ref 8.7–10.2)
Chloride: 105 mmol/L (ref 96–106)
Creatinine, Ser: 0.69 mg/dL (ref 0.57–1.00)
GFR calc Af Amer: 112 mL/min/{1.73_m2} (ref >59)
GFR calc non Af Amer: 97 mL/min/{1.73_m2} (ref >59)
Globulin, Total: 3.3 g/dL (ref 1.5–4.5)
Glucose: 79 mg/dL (ref 65–99)
Potassium: 4.4 mmol/L (ref 3.5–5.2)
Sodium: 141 mmol/L (ref 134–144)
Total Protein: 7.6 g/dL (ref 6.0–8.5)

## 2020-03-29 LAB — HCV RNA QUANT

## 2020-03-29 LAB — HCV RNA (INTERNATIONAL UNITS)
HCV RNA (International Units): 11200000 IU/mL
HCV log10: 7.049 log10 IU/mL

## 2020-03-29 LAB — URINE CULTURE

## 2020-03-30 ENCOUNTER — Telehealth: Payer: Self-pay | Admitting: Internal Medicine

## 2020-03-30 DIAGNOSIS — B9689 Other specified bacterial agents as the cause of diseases classified elsewhere: Secondary | ICD-10-CM

## 2020-03-30 MED ORDER — METRONIDAZOLE 500 MG PO TABS: 500.0000 mg | ORAL_TABLET | Freq: Two times a day (BID) | ORAL | 0 refills | Status: AC

## 2020-03-30 NOTE — Telephone Encounter (Signed)
Spoke with [name] regarding positive finding of current hepatitis infection and need to be referred to ID. Also discussed finding of BV for her vaginal discharge and recommendation to start metronidazole 500mg  BID for 7 days. [name] expressed understanding.

## 2020-03-30 NOTE — Addendum Note (Signed)
Addended by: Mosetta Anis on: 03/30/2020 08:41 AM   Modules accepted: Orders

## 2020-04-03 ENCOUNTER — Ambulatory Visit: Payer: Medicaid Other

## 2020-04-03 ENCOUNTER — Ambulatory Visit: Payer: Medicaid Other | Admitting: *Deleted

## 2020-04-03 DIAGNOSIS — Z5900 Homelessness unspecified: Secondary | ICD-10-CM

## 2020-04-03 DIAGNOSIS — F319 Bipolar disorder, unspecified: Secondary | ICD-10-CM

## 2020-04-03 NOTE — Chronic Care Management (AMB) (Signed)
  Care Management   Initial Visit Note  04/03/2020 Name: Kimberly [name] MRN: [id number] DOB: [birth year]    Objective: BP Readings from Last 3 Encounters:  03/27/20 120/74  02/09/19 132/69  01/22/19 136/89    Wt Readings from Last 3 Encounters:  03/27/20 145 lb 11.2 oz (66.1 kg)  02/09/19 147 lb (66.7 kg)  01/20/19 141 lb 1.5 oz (64 kg)   Lab Results  Component Value Date   ALT 32 03/27/2020   AST 37 03/27/2020   ALKPHOS 109 03/27/2020   BILITOT <0.2 03/27/2020    Assessment: Kimberly [name] is a 59 y.o. year old female who sees No primary care provider on file. for primary care. The care management team was consulted for assistance with care management and care coordination needs related to Care Coordination.   Review of patient status, including review of consultants reports, relevant laboratory and other test results, and collaboration with appropriate care team members and the patient's provider was performed as part of comprehensive patient evaluation and provision of care management services.    SDOH (Social Determinants of Health) assessments performed: No See Care Plan activities for detailed interventions related to [facility])     Outpatient Encounter Medications as of 04/03/2020  Medication Sig  . ibuprofen (ADVIL) 600 MG tablet Take 1 tablet (600 mg total) by mouth every 8 (eight) hours as needed. For back pain  . Ibuprofen-diphenhydrAMINE Cit (ADVIL PM PO) Take by mouth.  . metroNIDAZOLE (FLAGYL) 500 MG tablet Take 1 tablet (500 mg total) by mouth 2 (two) times daily for 7 days.   No facility-administered encounter medications on file as of 04/03/2020.    Goals Addressed            This Visit's Progress     Patient Stated   . "I told the social worker I'm homeless." (pt-stated)       CARE PLAN ENTRY (see longtitudinal plan of care for additional care plan information)   Current Barriers:  . Chronic Disease Management support, education, and care  coordination needs related to Bipolar Disorder and Homelessness  Case Manager Clinical Goal(s):  Marland Kitchen Over the next 180 days, patient will work with BSW to address needs related to Housing barriers in patient with Bipolar Disorder and Homelessness  Interventions:  . Collaborated with BSW to initiate plan of care to address needs related to Housing barriers in patient with Bipolar Disorder and Homelessness  Patient Self Care Activities:  . Patient verbalizes understanding of plan to work with BSW to investigate Section 8 Housing options . Self administers medications as prescribed . Attends all scheduled provider appointments . Calls pharmacy for medication refills . Calls provider office for new concerns or questions  Initial goal documentation         Follow up plan:  The care management team will reach out to the patient again over the next 180 days.   Kimberly [name] was given information about Care Management services today including:  1. Care Management services include personalized support from designated clinical staff supervised by a physician, including individualized plan of care and coordination with other care providers 2. 24/7 contact phone numbers for assistance for urgent and routine care needs. 3. The patient may stop Care Management services at any time (effective at the end of the month) by phone call to the office staff.  Patient agreed to services and verbal consent obtained.  Kelli Churn RN, CCM, [facility] RN Care Manager [phone]

## 2020-04-03 NOTE — Patient Instructions (Signed)
Visit Information  Goals Addressed            This Visit's Progress   . "I need a place of my own" (pt-stated)       Current Barriers:  Marland Kitchen Knowledge Barriers related to resources available for housing.  Patient currently residing with her aunt but not a stable situation.  Patient states that she is on wait list at [facility] apartments and was told she is second on the list at this time.  Patient recently approved for disability so now has some income.  Also receives food stamps.    Case Manager Clinical Goal(s):  Marland Kitchen Over the next 180 days, patient will work with BSW to address needs related to housing. . Over the next 180 days, BSW will collaborate with RN Care Manager to address care management and care coordination needs  Interventions:  . Patient interviewed and appropriate assessments performed . Submitted referral to [employer] via [employer] [place] [employer] . Provided patient with contact information for [facility] and encouraged her to call to schedule assessment for housing eligibility.   Nash Dimmer with care guide team regarding assistance with application with [employer] . Mailed listing of properties through Personal assistant and Affordable Housing Management . Collaborated with RN Care Manager and patient to establish an individualized plan of care   Patient Self Care Activities:  . Performs ADL's independently . Performs IADL's independently . States today and that is willing to do whatever she needs to do to find housing   Initial goal documentation        Patient verbalizes understanding of instructions provided today.   Telephone follow up appointment with care management team member scheduled for:     Ronn Melena, [place] Coordination Social Worker [place] [phone]

## 2020-04-03 NOTE — Patient Instructions (Signed)
Visit Information  Goals Addressed            This Visit's Progress     Patient Stated   . "I told the social worker I'm homeless." (pt-stated)       CARE PLAN ENTRY (see longtitudinal plan of care for additional care plan information)   Current Barriers:  . Chronic Disease Management support, education, and care coordination needs related to Bipolar Disorder and Homelessness  Case Manager Clinical Goal(s):  Marland Kitchen Over the next 180 days, patient will work with BSW to address needs related to Housing barriers in patient with Bipolar Disorder and Homelessness  Interventions:  . Collaborated with BSW to initiate plan of care to address needs related to Housing barriers in patient with Bipolar Disorder and Homelessness  Patient Self Care Activities:  . Patient verbalizes understanding of plan to work with BSW to investigate Section 8 Housing options . Self administers medications as prescribed . Attends all scheduled provider appointments . Calls pharmacy for medication refills . Calls provider office for new concerns or questions  Initial goal documentation        Ms. Pirie was given information about Care Management services today including:  1. Care Management services include personalized support from designated clinical staff supervised by her physician, including individualized plan of care and coordination with other care providers 2. 24/7 contact phone numbers for assistance for urgent and routine care needs. 3. The patient may stop CCM services at any time (effective at the end of the month) by phone call to the office staff.  Patient agreed to services and verbal consent obtained.   The patient verbalized understanding of instructions provided today and declined a print copy of patient instruction materials.   The care management team will reach out to the patient again over the next 180 days.   Kelli Churn RN, CCM, [facility] RN Care  Manager [phone]

## 2020-04-04 NOTE — Chronic Care Management (AMB) (Signed)
Chronic Care Management    Clinical Social Work General Note  04/04/2020 Name: Kimberly [name] MRN: [id number] DOB: May 23, 1961  Kimberly [name] is a 59 y.o. year old female who is a primary care patient of No primary care provider on file.. The CCM was consulted to assist the patient with housing.   Ms. Bains was given information about Chronic Care Management services today including:  1. CCM service includes personalized support from designated clinical staff supervised by her physician, including individualized plan of care and coordination with other care providers 2. 24/7 contact phone numbers for assistance for urgent and routine care needs. 3. Service will only be billed when office clinical staff spend 20 minutes or more in a month to coordinate care. 4. Only one practitioner may furnish and bill the service in a calendar month. 5. The patient may stop CCM services at any time (effective at the end of the month) by phone call to the office staff. 6. The patient will be responsible for cost sharing (co-pay) of up to 20% of the service fee (after annual deductible is met).  Patient agreed to services and verbal consent obtained.   Review of patient status, including review of consultants reports, relevant laboratory and other test results, and collaboration with appropriate care team members and the patient's provider was performed as part of comprehensive patient evaluation and provision of chronic care management services.    SDOH (Social Determinants of Health) assessments and interventions performed:  Yes SDOH Interventions     Most Recent Value  SDOH Interventions  SDOH Interventions for the Following Domains  Housing  Housing Interventions  NCCARE360 Referral, Other (Comment) [Referral to Care Guide for assistance with [facility] application]       Outpatient Encounter Medications as of 04/03/2020  Medication Sig  . ibuprofen (ADVIL) 600 MG tablet Take  1 tablet (600 mg total) by mouth every 8 (eight) hours as needed. For back pain  . Ibuprofen-diphenhydrAMINE Cit (ADVIL PM PO) Take by mouth.  . metroNIDAZOLE (FLAGYL) 500 MG tablet Take 1 tablet (500 mg total) by mouth 2 (two) times daily for 7 days.   No facility-administered encounter medications on file as of 04/03/2020.    Goals Addressed            This Visit's Progress   . "I need a place of my own" (pt-stated)       Current Barriers:  Marland Kitchen Knowledge Barriers related to resources available for housing.  Patient currently residing with her aunt but not a stable situation.  Patient states that she is on wait list at [facility] apartments and was told she is second on the list at this time.  Patient recently approved for disability so now has some income.  Also receives food stamps.    Case Manager Clinical Goal(s):  Marland Kitchen Over the next 180 days, patient will work with BSW to address needs related to housing. . Over the next 180 days, BSW will collaborate with RN Care Manager to address care management and care coordination needs  Interventions:  . Patient interviewed and appropriate assessments performed . Submitted referral to [employer] via [employer] [place] [employer] . Provided patient with contact information for [facility] and encouraged her to call to schedule assessment for housing eligibility.   Nash Dimmer with care guide team regarding assistance with application with [employer] . Mailed listing of properties through Personal assistant and Affordable Housing Management . Collaborated with Consulting civil engineer  and patient to establish an individualized plan of care   Patient Self Care Activities:  . Performs ADL's independently . Performs IADL's independently . States today and that is willing to do whatever she needs to do to find housing   Initial goal documentation         Follow Up Plan: Appointment scheduled for SW follow up  with client by phone on: 04/12/20        Ronn Melena, [place] Worker [place] [phone]

## 2020-04-06 ENCOUNTER — Ambulatory Visit
Admission: RE | Admit: 2020-04-06 | Discharge: 2020-04-06 | Disposition: A | Discharge status: Home / Self Care | Payer: Medicaid Other | Source: Ambulatory Visit | Attending: Internal Medicine | Admitting: Internal Medicine

## 2020-04-06 ENCOUNTER — Other Ambulatory Visit: Payer: Self-pay

## 2020-04-06 DIAGNOSIS — Z1231 Encounter for screening mammogram for malignant neoplasm of breast: Secondary | ICD-10-CM

## 2020-04-10 NOTE — Progress Notes (Signed)
Internal Medicine Clinic Resident  I have personally reviewed this encounter including the documentation in this note and/or discussed this patient with the care management provider. I will address any urgent items identified by the care management provider and will communicate my actions to the patient's PCP. I have reviewed the patient's CCM visit with my supervising attending, Dr Raines.  Kimberly Gillham K Jeanny Rymer, MD 04/10/2020   

## 2020-04-10 NOTE — Progress Notes (Signed)
Internal Medicine Clinic Resident  I have personally reviewed this encounter including the documentation in this note and/or discussed this patient with the care management provider. I will address any urgent items identified by the care management provider and will communicate my actions to the patient's PCP. I have reviewed the patient's CCM visit with my supervising attending, Dr Raines.  [name] [name], MD 04/10/2020   

## 2020-04-12 ENCOUNTER — Ambulatory Visit: Payer: Medicaid Other

## 2020-04-12 ENCOUNTER — Telehealth: Payer: Self-pay

## 2020-04-12 DIAGNOSIS — F319 Bipolar disorder, unspecified: Secondary | ICD-10-CM

## 2020-04-12 DIAGNOSIS — Z5900 Homelessness unspecified: Secondary | ICD-10-CM

## 2020-04-12 NOTE — Patient Instructions (Signed)
Visit Information  Goals Addressed            This Visit's Progress   . "I need a place of my own" (pt-stated)       Current Barriers:  Marland Kitchen Knowledge Barriers related to resources available for housing.  Patient currently residing with her aunt but not a stable situation.  Patient states that she is on wait list at [facility] apartments and was told she is second on the list at this time.  Patient recently approved for disability so now has some income.  Also receives food stamps.    Case Manager Clinical Goal(s):  Marland Kitchen Over the next 180 days, patient will work with BSW to address needs related to housing. . Over the next 180 days, BSW will collaborate with RN Care Manager to address care management and care coordination needs  Interventions:   . Inquired if patient contacted [facility] regarding housing programs. Patient reports that she did contact them.  Per patient, she was told that they cannot assist because she is technically not homeless.   . Message sent to care guide team regarding referral for assistance with [facility] application; Patient states that she has not been contacted.  No outreach notes in [place]  . Ensured that patient received listing of properties through [employer] and [employer].  Patient confirmed receipt and stated that she would start calling properties today.  . Talked with patient about [place] [address] and encouraged her to call for additional housing resources.   Patient Self Care Activities:  . Performs ADL's independently . Performs IADL's independently . Patient says that she is willing to do whatever she needs to do to find housing   Please see past updates related to this goal by clicking on the "Past Updates" button in the selected goal         Patient verbalizes understanding of instructions provided today.   Telephone follow up appointment with care management team member scheduled  for:04/26/20    Ronn Melena, [place] Coordination Social Worker [place] [phone]

## 2020-04-12 NOTE — Progress Notes (Signed)
Internal Medicine Clinic Attending  CCM services provided by the care management provider and their documentation were discussed with Dr. Aslam. We reviewed the pertinent findings, urgent action items addressed by the resident and non-urgent items to be addressed by the PCP.  I agree with the assessment, diagnosis, and plan of care documented in the CCM and resident's note.  Garold Sheeler Thomas Abeeha Twist, MD 04/12/2020  

## 2020-04-12 NOTE — Progress Notes (Signed)
Internal Medicine Clinic Resident  I have personally reviewed this encounter including the documentation in this note and/or discussed this patient with the care management provider. I will address any urgent items identified by the care management provider and will communicate my actions to the patient's PCP. I have reviewed the patient's CCM visit with my supervising attending, Dr Vincent.  Trishna Cwik, MD  Internal Medicine, PGY-1 04/12/2020    

## 2020-04-12 NOTE — Telephone Encounter (Signed)
5/19/21Left message for patient to return my call regarding applying for housing with [employer]. Ambrose Mantle [phone]

## 2020-04-12 NOTE — Chronic Care Management (AMB) (Signed)
  Care Management   Follow Up Note   04/12/2020 Name: Kimberly [name] MRN: [id number] DOB: 10/16/1961  Referred by: Patient, No Pcp Per Reason for referral : Care Coordination (Housing)   Kimberly [name] is a 59 y.o. year old female who is a primary care patient of Patient, No Pcp Per. The care management team was consulted for assistance with care management and care coordination needs.    Review of patient status, including review of consultants reports, relevant laboratory and other test results, and collaboration with appropriate care team members and the patient's provider was performed as part of comprehensive patient evaluation and provision of chronic care management services.    SDOH (Social Determinants of Health) assessments performed: No See Care Plan activities for detailed interventions related to [facility])     Advanced Directives: See Care Plan and [employer] application for related entries.   Goals Addressed            This Visit's Progress   . "I need a place of my own" (pt-stated)       Current Barriers:  Marland Kitchen Knowledge Barriers related to resources available for housing.  Patient currently residing with her aunt but not a stable situation.  Patient states that she is on wait list at [facility] apartments and was told she is second on the list at this time.  Patient recently approved for disability so now has some income.  Also receives food stamps.    Case Manager Clinical Goal(s):  Marland Kitchen Over the next 180 days, patient will work with BSW to address needs related to housing. . Over the next 180 days, BSW will collaborate with RN Care Manager to address care management and care coordination needs  Interventions:   . Inquired if patient contacted [facility] regarding housing programs. Patient reports that she did contact them.  Per patient, she was told that they cannot assist because she is technically not homeless.   . Message sent to care guide team  regarding referral for assistance with [facility] application; Patient states that she has not been contacted.  No outreach notes in [place]  . Ensured that patient received listing of properties through [employer] and [employer].  Patient confirmed receipt and stated that she would start calling properties today.  . Talked with patient about [place] [address] and encouraged her to call for additional housing resources.   Patient Self Care Activities:  . Performs ADL's independently . Performs IADL's independently . Patient says that she is willing to do whatever she needs to do to find housing   Please see past updates related to this goal by clicking on the "Past Updates" button in the selected goal          Telephone follow up appointment with care management team member scheduled for:04/26/20   Ronn Melena, [place] Coordination Social Worker [place] [phone]

## 2020-04-13 ENCOUNTER — Telehealth: Payer: Self-pay

## 2020-04-13 ENCOUNTER — Ambulatory Visit: Payer: Self-pay

## 2020-04-13 DIAGNOSIS — Z5900 Homelessness unspecified: Secondary | ICD-10-CM

## 2020-04-13 NOTE — Patient Instructions (Signed)
Visit Information  Goals Addressed            This Visit's Progress   . "I need a place of my own" (pt-stated)       Current Barriers:  Marland Kitchen Knowledge Barriers related to resources available for housing.  Patient was residing with her aunt but reports that she was put out today and is currently on the streets.    Case Manager Clinical Goal(s):  Marland Kitchen Over the next 180 days, patient will work with BSW to address needs related to housing. . Over the next 180 days, BSW will collaborate with RN Care Manager to address care management and care coordination needs  Interventions:   . Collaborated with Care Guide, Ambrose Mantle as she contacted patient today regarding referral for housing.  Patient informed her at time of call that she was put out of her Aunt's home today.  Per Ms. Andree Elk, she contacted all local shelters as well as [place] and was told that there are currently no vacancies.  Shelters are currently functioning at lower capacity due to COVID 19 . Messaged Aron Baba, Director of Vulnerable Populations, to inquire about other potential resources  . Contacted patient to encourage her to call [facility].  Patient reports that she spoke with Jackelyn Poling and is awaiting a return call.    Patient Self Care Activities:  . Performs ADL's independently . Performs IADL's independently . Patient says that she is willing to do whatever she needs to do to find housing   Please see past updates related to this goal by clicking on the "Past Updates" button in the selected goal         Patient verbalizes understanding of instructions provided today.   The patient has been provided with contact information for the care management team and has been advised to call with any health related questions or concerns.    Ronn Melena, [place] Coordination Social Worker [place] [phone]

## 2020-04-13 NOTE — Telephone Encounter (Signed)
04/12/20 3:27pm Left message for patient to return my call regarding applying for housing with [employer]. Ambrose Mantle [phone]

## 2020-04-13 NOTE — Progress Notes (Signed)
Internal Medicine Clinic Attending  CCM services provided by the care management provider and their documentation were discussed with Dr. Aslam. We reviewed the pertinent findings, urgent action items addressed by the resident and non-urgent items to be addressed by the PCP.  I agree with the assessment, diagnosis, and plan of care documented in the CCM and resident's note.  Hamda Klutts Thomas Bahar Shelden, MD 04/13/2020  

## 2020-04-13 NOTE — Chronic Care Management (AMB) (Signed)
  Care Management   Follow Up Note   04/13/2020 Name: Kimberly [name] MRN: [id number] DOB: 10-18-1961  Referred by: Mosetta Anis, MD Reason for referral : Care Coordination (Emergency Housing)   Kimberly [name] is a 59 y.o. year old female who is a primary care patient of [name], MD. The care management team was consulted for assistance with care management and care coordination needs.    Review of patient status, including review of consultants reports, relevant laboratory and other test results, and collaboration with appropriate care team members and the patient's provider was performed as part of comprehensive patient evaluation and provision of chronic care management services.    SDOH (Social Determinants of Health) assessments performed: No See Care Plan activities for detailed interventions related to [facility])     Advanced Directives: See Care Plan and [employer]yn[employer] application for related entries.   Goals Addressed            This Visit's Progress   . "I need a place of my own" (pt-stated)       Current Barriers:  Marland Kitchen Knowledge Barriers related to resources available for housing.  Patient was residing with her aunt but reports that she was put out today and is currently on the streets.    Case Manager Clinical Goal(s):  Marland Kitchen Over the next 180 days, patient will work with BSW to address needs related to housing. . Over the next 180 days, BSW will collaborate with RN Care Manager to address care management and care coordination needs  Interventions:   . Collaborated with Care Guide, Ambrose Mantle as she contacted patient today regarding referral for housing.  Patient informed her at time of call that she was put out of her Aunt's home today.  Per Ms. Andree Elk, she contacted all local shelters as well as [place] and was told that there are currently no vacancies.  Shelters are currently functioning at lower capacity due to COVID 19 . Messaged Aron Baba, Director of Vulnerable Populations, to inquire about other potential resources  . Contacted patient to encourage her to call [facility].  Patient reports that she spoke with Jackelyn Poling and is awaiting a return call.    Patient Self Care Activities:  . Performs ADL's independently . Performs IADL's independently . Patient says that she is willing to do whatever she needs to do to find housing   Please see past updates related to this goal by clicking on the "Past Updates" button in the selected goal          The patient has been provided with contact information for the care management team and has been advised to call with any health related questions or concerns.     Ronn Melena, [place] Coordination Social Worker [place] [phone]

## 2020-04-13 NOTE — Telephone Encounter (Signed)
04/13/20 Spoke with patient about applying for senior housing with [employer].  Patient was able to apply over the phone for senior housing and is waiting for a return call from them.  Left message at [place] to return my call regarding housing if I do not hear from them I will call again this afternoon. They are the gateway for all shelters in [place] and know the availability. Ambrose Mantle [phone]

## 2020-04-13 NOTE — Progress Notes (Signed)
Internal Medicine Clinic Resident  I have personally reviewed this encounter including the documentation in this note and/or discussed this patient with the care management provider. I will address any urgent items identified by the care management provider and will communicate my actions to the patient's PCP. I have reviewed the patient's CCM visit with my supervising attending, Dr Vincent.  March Joos, MD  Internal Medicine, PGY-1 04/13/2020    

## 2020-04-14 ENCOUNTER — Telehealth: Payer: Self-pay

## 2020-04-14 NOTE — Telephone Encounter (Signed)
04/14/20 Spoke with patient she is staying with her cousin temporarily and has received calls from [employer], [employer], [employer] and [employer].  Still waiting to hear from [facility].  Will call patient on 04/17/20. Ambrose Mantle [phone]

## 2020-04-17 ENCOUNTER — Telehealth: Payer: Self-pay

## 2020-04-17 NOTE — Telephone Encounter (Signed)
COVID-19 Pre-Screening Questions:04/17/20   Do you currently have a fever (>100 F), chills or unexplained body aches? NO   Are you currently experiencing new cough, shortness of breath, sore throat, runny nose? NO .  Have you recently travelled outside the state of [place] in the last 14 days? NO .  Have you been in contact with someone that is currently pending confirmation of Covid19 testing or has been confirmed to have the [place] virus?NO   **If the patient answers NO to ALL questions -  advise the patient to please call the clinic before coming to the office should any symptoms develop.

## 2020-04-18 ENCOUNTER — Ambulatory Visit (INDEPENDENT_AMBULATORY_CARE_PROVIDER_SITE_OTHER): Payer: Medicaid Other | Admitting: Family

## 2020-04-18 ENCOUNTER — Telehealth: Payer: Self-pay | Admitting: Pharmacy Technician

## 2020-04-18 ENCOUNTER — Other Ambulatory Visit: Payer: Self-pay

## 2020-04-18 ENCOUNTER — Encounter: Payer: Self-pay | Admitting: Family

## 2020-04-18 ENCOUNTER — Telehealth: Payer: Self-pay

## 2020-04-18 VITALS — BP 111/74 | HR 83 | Wt 138.0 lb

## 2020-04-18 DIAGNOSIS — B182 Chronic viral hepatitis C: Secondary | ICD-10-CM | POA: Diagnosis not present

## 2020-04-18 NOTE — Progress Notes (Signed)
Subjective:    Patient ID: Kimberly [name], female    DOB: [birth year], 59 y.o.   MRN: [id number]  Chief Complaint  Patient presents with  . New Patient (Initial Visit)    New HCV    HPI:  Kimberly [name] is a 59 y.o. female with previous medical history of alcohol abuse, bipolar disorder 1, homelessness, substance use, and hepatitis C presenting today at the request of her primary care for initial evaluation and treatment of hepatitis C.  Ms. Glasper does not recall when she was initially diagnosed with hepatitis C.  She has used IV drugs in the past and reports most recently yesterday has used cocaine and marijuana.  Denies any history of blood transfusions prior to 1992, tattoos from a homemade source, sharing of razors/toothbrushes, or sexual contact with known positive partner.  No personal or family history of liver disease.  She has not been treated since initial diagnosis.  Currently without symptoms and denies abdominal pain, nausea, vomiting, scleral icterus, jaundice, or fatigue.  Review of lab work from primary care on 03/27/2020 with hepatitis C RNA level of 11,200,000 IU/mL and negative for HIV.   No Known Allergies    Outpatient Medications Prior to Visit  Medication Sig Dispense Refill  . ibuprofen (ADVIL) 600 MG tablet Take 1 tablet (600 mg total) by mouth every 8 (eight) hours as needed. For back pain 60 tablet 0  . Ibuprofen-diphenhydrAMINE Cit (ADVIL PM PO) Take by mouth.     No facility-administered medications prior to visit.     Past Medical History:  Diagnosis Date  . Anxiety   . Bipolar 1 disorder ([place])   . Depression   . Hepatitis C   . Left nephrolithiasis   . Left nephrolithiasis       Past Surgical History:  Procedure Laterality Date  . ACROMIO-CLAVICULAR JOINT REPAIR Left 02/24/2015   Procedure: LEFT ACROMIO-CLAVICULAR JOINT RECONSTRUCTION;  Surgeon: Leandrew Koyanagi, MD;  Location: [facility];  Service: Orthopedics;  Laterality: Left;  .  CYSTOSCOPY W/ URETERAL STENT PLACEMENT Left 07/22/2017   Procedure: CYSTOSCOPY WITH RETROGRADE PYELOGRAM/URETERAL STENT PLACEMENT;  Surgeon: Alexis Frock, MD;  Location: [facility];  Service: Urology;  Laterality: Left;  . CYSTOSCOPY W/ URETERAL STENT REMOVAL Left 01/20/2019   Procedure: CYSTOSCOPY WITH STENT REMOVAL;  Surgeon: Alexis Frock, MD;  Location: [facility];  Service: Urology;  Laterality: Left;  . CYSTOSCOPY WITH LITHOLAPAXY N/A 01/20/2019   Procedure: CYSTOSCOPY WITH LITHOLAPAXY;  Surgeon: Alexis Frock, MD;  Location: [facility];  Service: Urology;  Laterality: N/A;  . IR NEPHROSTOMY PLACEMENT LEFT  11/18/2018  . IR NEPHROSTOMY TUBE CHANGE  12/18/2018  . NEPHROLITHOTOMY Left 01/20/2019   Procedure: NEPHROLITHOTOMY PERCUTANEOUS;  Surgeon: Alexis Frock, MD;  Location[facility];  Service: Urology;  Laterality: Left;  3 HRS  . ORIF MANDIBULAR FRACTURE N/A 03/22/2016   Procedure: OPEN REDUCTION INTERNAL FIXATION (ORIF) MANDIBULAR FRACTURE;  Surgeon: Melissa Montane, MD;  Location: [facility];  Service: ENT;  Laterality: N/A;  . URETEROSCOPY Left 01/20/2019   Procedure: DIAGNOSTIC URETEROSCOPY;  Surgeon: Alexis Frock, MD;  Location[facility];  Service: Urology;  Laterality: Left;      History reviewed. No pertinent family history.    Social History   Socioeconomic History  . Marital status: Single    Spouse name: Not on file  . Number of children: Not on file  . Years of education: Not on file  . Highest education level: Not on file  Occupational History  .  Not on file  Tobacco Use  . Smoking status: Current Every Day Smoker    Packs/day: 1.00    Types: Cigarettes  . Smokeless tobacco: Never Used  . Tobacco comment: 1 PPD  Substance and Sexual Activity  . Alcohol use: Not Currently    Alcohol/week: 1.0 standard drinks    Types: 1 Cans of beer per week    Comment: occ  . Drug use: Not Currently    Comment: Pt denies substance use during this TTS assessment on 08-25-14 UDS + for THC  .  Sexual activity: Not Currently  Other Topics Concern  . Not on file  Social History Narrative  . Not on file   Social Determinants of Health   Financial Resource Strain: High Risk  . Difficulty of Paying Living Expenses: Very hard  Food Insecurity: Food Insecurity Present  . Worried About Charity fundraiser in the Last Year: Often true  . Ran Out of Food in the Last Year: Often true  Transportation Needs: No Transportation Needs  . Lack of Transportation (Medical): No  . Lack of Transportation (Non-Medical): No  Physical Activity:   . Days of Exercise per Week:   . Minutes of Exercise per Session:   Stress: Stress Concern Present  . Feeling of Stress : Very much  Social Connections:   . Frequency of Communication with Friends and Family:   . Frequency of Social Gatherings with Friends and Family:   . Attends Religious Services:   . Active Member of Clubs or Organizations:   . Attends Archivist Meetings:   Marland Kitchen Marital Status:   Intimate Partner Violence:   . Fear of Current or Ex-Partner:   . Emotionally Abused:   Marland Kitchen Physically Abused:   . Sexually Abused:       Review of Systems  Constitutional: Negative for chills, fatigue, fever and unexpected weight change.  Respiratory: Negative for cough, chest tightness, shortness of breath and wheezing.   Cardiovascular: Negative for chest pain and leg swelling.  Gastrointestinal: Negative for abdominal distention, constipation, diarrhea, nausea and vomiting.  Neurological: Negative for dizziness, weakness, light-headedness and headaches.  Hematological: Does not bruise/bleed easily.       Objective:    BP 111/74   Pulse 83   Wt 138 lb (62.6 kg)   LMP 10/09/2012   BMI 21.61 kg/m  Nursing note and vital signs reviewed.  Physical Exam Constitutional:      General: She is not in acute distress.    Appearance: She is well-developed.  Cardiovascular:     Rate and Rhythm: Normal rate and regular rhythm.      Heart sounds: Normal heart sounds. No murmur. No friction rub. No gallop.   Pulmonary:     Effort: Pulmonary effort is normal. No respiratory distress.     Breath sounds: Normal breath sounds. No wheezing or rales.  Chest:     Chest wall: No tenderness.  Abdominal:     General: Bowel sounds are normal. There is no distension.     Palpations: Abdomen is soft. There is no mass.     Tenderness: There is no abdominal tenderness. There is no guarding or rebound.  Skin:    General: Skin is warm and dry.  Neurological:     Mental Status: She is alert and oriented to person, place, and time.  Psychiatric:        Mood and Affect: Affect is flat.        Thought Content:  Thought content normal.        Judgment: Judgment normal.         Assessment & Plan:   Patient Active Problem List   Diagnosis Date Noted  . Chronic hepatitis C without hepatic coma ([place]) 04/18/2020  . Gross hematuria 03/27/2020  . Encounter for screening mammogram for breast cancer 03/27/2020  . Encounter for Papanicolaou smear for cervical cancer screening 03/27/2020  . Bacterial vaginosis 03/27/2020  . Homeless 02/09/2019  . History of nephrolithiasis 01/20/2019  . Bipolar 1 disorder ([place]) 12/08/2018  . Tobacco use disorder   . History of alcohol abuse      Problem List Items Addressed This Visit      Digestive   Chronic hepatitis C without hepatic coma ([place]) - Primary    Ms. Sinnett is a 59 year old female with chronic hepatitis C with initial viral load of 11,200,000 IU/mL likely obtained from history and recent IV drug use.  She is asymptomatic and treatment nave.  Discussed the pathogenesis, risks if left untreated, transmission, prevention, and treatment options for hepatitis C.  She does continue to actively use which is concerning for her ability to complete treatment.  Check hepatitis B status, hepatic function, hepatitis C genotype and liver fibrosis.  She met with pharmacy staff to complete medication  assistance paperwork.  Plan for treatment pending blood work results.      Relevant Orders   Hepatitis C genotype   Liver Fibrosis, FibroTest-ActiTest   Protime-INR   Hepatic function panel   Hepatitis B surface antibody,qualitative   Hepatitis B surface antigen       I am having Karin Golden maintain her Ibuprofen-diphenhydrAMINE Cit (ADVIL PM PO) and ibuprofen.   Follow-up: Return Pending blood work and 1 month after starting treatment.Terri Piedra, MSN, FNP-C Nurse Practitioner [facility] for Infectious Disease [place] number: [phone]

## 2020-04-18 NOTE — Telephone Encounter (Addendum)
[facility] Patient Advocate Encounter   Patient filled out and signed readiness to treat form for prior authorization submittal for Hepatitis C medication.  She did indicate that she drank yesterday (not sure how much, it was a party) and did she also used "reefer" and "coke".    This may hinder approval through [facility] Medicaid, they may insist that she be in a treatment program before approving.  We will continue to follow.   Kimberly [name]. [name] Patient [facility] for Infectious Disease Phone: [phone] Fax:  [phone]

## 2020-04-18 NOTE — Patient Instructions (Signed)
Nice to see you.  We will check your lab work today and let you know the results.   Plan for follow up 1 month after starting medication.  Limit acetaminophen (Tylenol) usage to no more than 2 grams (2,000 mg) per day.  Avoid alcohol.  Do not share toothbrushes or razors.  Practice safe sex to protect against transmission as well as sexually transmitted disease.    Hepatitis C Hepatitis C is a viral infection of the liver. It can lead to scarring of the liver (cirrhosis), liver failure, or liver cancer. Hepatitis C may go undetected for months or years because people with the infection may not have symptoms, or they may have only mild symptoms. What are the causes? This condition is caused by the hepatitis C virus (HCV). The virus can spread from person to person (is contagious) through:  Blood.  Childbirth. A woman who has hepatitis C can pass it to her baby during birth.  Bodily fluids, such as breast milk, tears, semen, vaginal fluids, and saliva.  Blood transfusions or organ transplants done in the [place] before 1992.  What increases the risk? The following factors may make you more likely to develop this condition:  Having contact with unclean (contaminated) needles or syringes. This may result from: ? Acupuncture. ? Tattoing. ? Body piercing. ? Injecting drugs.  Having unprotected sex with someone who is infected.  Needing treatment to filter your blood (kidney dialysis).  Having HIV (human immunodeficiency virus) or AIDS (acquired immunodeficiency syndrome).  Working in a job that involves contact with blood or bodily fluids, such as health care.  What are the signs or symptoms? Symptoms of this condition include:  Fatigue.  Loss of appetite.  Nausea.  Vomiting.  Abdominal pain.  Dark yellow urine.  Yellowish skin and eyes (jaundice).  Itchy skin.  Clay-colored bowel movements.  Joint pain.  Bleeding and bruising easily.  Fluid  building up in your stomach (ascites).  In some cases, you may not have any symptoms. How is this diagnosed? This condition is diagnosed with:  Blood tests.  Other tests to check how well your liver is functioning. They may include: ? Magnetic resonance elastography (MRE). This imaging test uses MRIs and sound waves to measure liver stiffness. ? Transient elastography. This imaging test uses ultrasounds to measure liver stiffness. ? Liver biopsy. This test requires taking a small tissue sample from your liver to examine it under a microscope.  How is this treated? Your health care provider may perform noninvasive tests or a liver biopsy to help decide the best course of treatment. Treatment may include:  Antiviral medicines and other medicines.  Follow-up treatments every 6-12 months for infections or other liver conditions.  Receiving a donated liver (liver transplant).  Follow these instructions at home: Medicines  Take over-the-counter and prescription medicines only as told by your health care provider.  Take your antiviral medicine as told by your health care provider. Do not stop taking the antiviral even if you start to feel better.  Do not take any medicines unless approved by your health care provider, including over-the-counter medicines and birth control pills. Activity  Rest as needed.  Do not have sex unless approved by your health care provider.  Ask your health care provider when you may return to school or work. Eating and drinking  Eat a balanced diet with plenty of fruits and vegetables, whole grains, and lowfat (lean) meats or non-meat proteins (such as beans or tofu).  Drink  enough fluids to keep your urine clear or pale yellow.  Do not drink alcohol. General instructions  Do not share toothbrushes, nail clippers, or razors.  Wash your hands frequently with soap and water. If soap and water are not available, use hand sanitizer.  Cover any cuts or  open sores on your skin to prevent spreading the virus.  Keep all follow-up visits as told by your health care provider. This is important. You may need follow-up visits every 6-12 months. How is this prevented? There is no vaccine for hepatitis C. The only way to prevent the disease is to reduce the risk of exposure to the virus. Make sure you:  Wash your hands frequently with soap and water. If soap and water are not available, use hand sanitizer.  Do not share needles or syringes.  Practice safe sex and use condoms.  Avoid handling blood or bodily fluids without gloves or other protection.  Avoid getting tattoos or piercings in shops or other locations that are not clean.  Contact a health care provider if:  You have a fever.  You develop abdominal pain.  You pass dark urine.  You pass clay-colored stools.  You develop joint pain. Get help right away if:  You have increasing fatigue or weakness.  You lose your appetite.  You cannot eat or drink without vomiting.  You develop jaundice or your jaundice gets worse.  You bruise or bleed easily. Summary  Hepatitis C is a viral infection of the liver. It can lead to scarring of the liver (cirrhosis), liver failure, or liver cancer.  The hepatitis C virus (HCV) causes this condition. The virus can pass from person to person (is contagious).  You should not take any medicines unless approved by your health care provider. This includes over-the-counter medicines and birth control pills. This information is not intended to replace advice given to you by your health care provider. Make sure you discuss any questions you have with your health care provider. Document Released: 11/08/2000 Document Revised: 12/17/2016 Document Reviewed: 12/17/2016 Elsevier Interactive Patient Education  [employer].

## 2020-04-18 NOTE — Telephone Encounter (Signed)
04/18/20 Spoke with patient she is staying with her niece for now.  She has received return calls from [employer] and completed and application with them, [employer] assisting with finding housing in senior community.  Will follow-up with patient on 04/19/20. Kimberly [name] [phone].

## 2020-04-18 NOTE — Assessment & Plan Note (Signed)
Kimberly [name] is a 59 year old female with chronic hepatitis C with initial viral load of 11,200,000 IU/mL likely obtained from history and recent IV drug use.  She is asymptomatic and treatment nave.  Discussed the pathogenesis, risks if left untreated, transmission, prevention, and treatment options for hepatitis C.  She does continue to actively use which is concerning for her ability to complete treatment.  Check hepatitis B status, hepatic function, hepatitis C genotype and liver fibrosis.  She met with pharmacy staff to complete medication assistance paperwork.  Plan for treatment pending blood work results.

## 2020-04-18 NOTE — Telephone Encounter (Signed)
[name], English as a foreign language completed.    The patient is insured through [facility] and has a $3 copay.  A readiness to treat form will need to be filled out and signed before submitting prior authorization for a Hepatitis C medication.  We will continue to follow to see if copay assistance is needed.  Kimberly [name]. Nadara Mustard [place] Patient [facility] for Infectious Disease Phone: [phone] Fax:  [phone]

## 2020-04-19 ENCOUNTER — Ambulatory Visit: Payer: Self-pay

## 2020-04-19 ENCOUNTER — Telehealth: Payer: Medicaid Other

## 2020-04-19 DIAGNOSIS — Z5900 Homelessness unspecified: Secondary | ICD-10-CM

## 2020-04-19 NOTE — Chronic Care Management (AMB) (Signed)
  Care Management   Follow Up Note   04/19/2020 Name: Kimberly [name] MRN: [id number] DOB: May 24, 1961  Referred by: Mosetta Anis, MD Reason for referral : Care Coordination (Housing)   Kimberly [name] is a 59 y.o. year old female who is a primary care patient of [name], MD. The care management team was consulted for assistance with care management and care coordination needs.    Review of patient status, including review of consultants reports, relevant laboratory and other test results, and collaboration with appropriate care team members and the patient's provider was performed as part of comprehensive patient evaluation and provision of chronic care management services.    SDOH (Social Determinants of Health) assessments performed: No See Care Plan activities for detailed interventions related to [facility])     Advanced Directives: See Care Plan and Vynca application for related entries.   Goals Addressed            This Visit's Progress   . "I need a place of my own" (pt-stated)       Current Barriers:  Marland Kitchen Knowledge Barriers related to resources available for housing.  Patient currently staying with her niece.    Case Manager Clinical Goal(s):  Marland Kitchen Over the next 180 days, patient will work with BSW to address needs related to housing. . Over the next 180 days, BSW will collaborate with RN Care Manager to address care management and care coordination needs  Interventions:   . Received the following update from [place], Ambrose Mantle.  "She is staying with her niece for now.  She has received return calls from [employer] and completed an application with them, [employer] assisting with finding housing in senior community"  Patient Self Care Activities:  . Performs ADL's independently . Performs IADL's independently . Patient says that she is willing to do whatever she needs to do to find housing   Please see past updates related to this goal by  clicking on the "Past Updates" button in the selected goal          The patient has been provided with contact information for the care management team and has been advised to call with any community resource needs/questions.       Ronn Melena, [place] Coordination Social Worker [place] [phone]

## 2020-04-19 NOTE — Patient Instructions (Signed)
Visit Information  Goals Addressed            This Visit's Progress   . "I need a place of my own" (pt-stated)       Current Barriers:  Marland Kitchen Knowledge Barriers related to resources available for housing.  Patient currently staying with her niece.    Case Manager Clinical Goal(s):  Marland Kitchen Over the next 180 days, patient will work with BSW to address needs related to housing. . Over the next 180 days, BSW will collaborate with RN Care Manager to address care management and care coordination needs  Interventions:   . Received the following update from [place], Ambrose Mantle.  "She is staying with her niece for now.  She has received return calls from [employer] and completed an application with them, [employer] assisting with finding housing in senior community"  Patient Self Care Activities:  . Performs ADL's independently . Performs IADL's independently . Patient says that she is willing to do whatever she needs to do to find housing   Please see past updates related to this goal by clicking on the "Past Updates" button in the selected goal          The patient has been provided with contact information for the care management team and has been advised to call with any community resource needs/questions.      Ronn Melena, [place] Coordination Social Worker [place] [phone]

## 2020-04-21 ENCOUNTER — Telehealth: Payer: Self-pay

## 2020-04-21 NOTE — Telephone Encounter (Signed)
04/21/20 Spoke with patient she is still staying with her niece and is doing ok.  She is still working with the [employer], gave her another contact to call, also the [employer].  They have told her to make sure she answers the phone, that they would be calling her.  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 Closing referral. Ambrose Mantle [phone]

## 2020-04-26 ENCOUNTER — Telehealth: Payer: Medicaid Other

## 2020-04-26 ENCOUNTER — Ambulatory Visit: Payer: Self-pay

## 2020-04-26 DIAGNOSIS — Z5900 Homelessness unspecified: Secondary | ICD-10-CM

## 2020-04-27 ENCOUNTER — Ambulatory Visit: Payer: Self-pay

## 2020-04-27 DIAGNOSIS — Z5900 Homelessness unspecified: Secondary | ICD-10-CM

## 2020-04-27 NOTE — Chronic Care Management (AMB) (Signed)
  Care Management   Follow Up Note   04/27/2020 Name: Kimberly [name] MRN: [id number] DOB: 1960-12-27  Referred by: Kimberly Anis, MD Reason for referral : Care Coordination (Housing)   Kimberly [name] is a 59 y.o. year old female who is a primary care patient of Kimberly Hayward Dimple Nanas, MD. The care management team was consulted for assistance with care management and care coordination needs.    Review of patient status, including review of consultants reports, relevant laboratory and other test results, and collaboration with appropriate care team members and the patient's provider was performed as part of comprehensive patient evaluation and provision of chronic care management services.    SDOH (Social Determinants of Health) assessments performed: No See Care Plan activities for detailed interventions related to [facility])     Advanced Directives: See Care Plan and Vynca application for related entries.   Goals Addressed            This Visit's Progress   . "I need a place of my own" (pt-stated)       Current Barriers:  Marland Kitchen Knowledge Barriers related to resources available for housing. Received call from patient on 04/26/20 requesting collaboration with Kimberly [name], Corporate treasurer at [facility].  Patient stated that they have an opening for her but need to communicate with Education officer, museum.  Voicemail messages were left for [name] on 04/26/20 and 04/27/20.  There is no operator to speak to when calling agency, only option is to choose appropriate extension.  Received another call from patient today requesting outreach to [name].  Informed her that two messages have been left with no response as of yet.  Patient became upset and disconnected call. Per Care Guide, Kimberly [name], patient reported today that she was kicked out of her niece's house.  Patient did not report this during our call.     Case Manager Clinical Goal(s):  Marland Kitchen Over the next 180 days, patient will work with BSW to  address needs related to housing. . Over the next 180 days, BSW will collaborate with RN Care Manager to address care management and care coordination needs  Interventions:   . Left second voicemail message for Kimberly [name], Property Site Manager with [employer], for possible housing assistance. . Informed patient that two voicemail messages have been left for [name].  Called her again after hanging up with patient and she still did not answer.   . Asked patient to get additional contact information from [name] if she is contacted by her again . Collaborated with Care Guide, Kimberly [name], to inquire if she has additional contact information for [name].  Unfortunately, she does not .   Patient Self Care Activities:  . Performs ADL's independently . Performs IADL's independently . Patient says that she is willing to do whatever she needs to do to find housing   Please see past updates related to this goal by clicking on the "Past Updates" button in the selected goal          The patient has been provided with contact information for the care management team.      Kimberly [name], [place] Worker [place] [phone]

## 2020-04-27 NOTE — Patient Instructions (Signed)
Visit Information  Goals Addressed            This Visit's Progress   . "I need a place of my own" (pt-stated)       Current Barriers:  Marland Kitchen Knowledge Barriers related to resources available for housing.  Patient currently staying with her niece.    Case Manager Clinical Goal(s):  Marland Kitchen Over the next 180 days, patient will work with BSW to address needs related to housing. . Over the next 180 days, BSW will collaborate with RN Care Manager to address care management and care coordination needs  Interventions:   . Received call from patient requesting collaboration with Mariah Milling, Property Site Manager with [facility], for possible housing assistance. . Left voicemail message for Ms. Negron requesting call back.   Patient Self Care Activities:  . Performs ADL's independently . Performs IADL's independently . Patient says that she is willing to do whatever she needs to do to find housing   Please see past updates related to this goal by clicking on the "Past Updates" button in the selected goal         Patient verbalizes understanding of instructions provided today.   The patient has been provided with contact information for the care management team and has been advised to call with any health related questions or concerns.       Ronn Melena, [place] Coordination Social Worker [place] [phone]

## 2020-04-27 NOTE — Chronic Care Management (AMB) (Signed)
  Care Management   Follow Up Note   04/27/2020 Name: Kimberly [name] MRN: [id number] DOB: Apr 11, 1961  Referred by: Mosetta Anis, MD Reason for referral : Care Coordination (Housing)   Kimberly [name] is a 59 y.o. year old female who is a primary care patient of [name], MD. The care management team was consulted for assistance with care management and care coordination needs.    Review of patient status, including review of consultants reports, relevant laboratory and other test results, and collaboration with appropriate care team members and the patient's provider was performed as part of comprehensive patient evaluation and provision of chronic care management services.    SDOH (Social Determinants of Health) assessments performed: No See Care Plan activities for detailed interventions related to [facility])     Advanced Directives: See Care Plan and Vynca application for related entries.   Goals Addressed            This Visit's Progress   . "I need a place of my own" (pt-stated)       Current Barriers:  Marland Kitchen Knowledge Barriers related to resources available for housing.  Patient currently staying with her niece.    Case Manager Clinical Goal(s):  Marland Kitchen Over the next 180 days, patient will work with BSW to address needs related to housing. . Over the next 180 days, BSW will collaborate with RN Care Manager to address care management and care coordination needs  Interventions:   . Received call from patient requesting collaboration with Mariah Milling, Property Site Manager with [facility], for possible housing assistance. . Left voicemail message for Ms. Negron requesting call back.   Patient Self Care Activities:  . Performs ADL's independently . Performs IADL's independently . Patient says that she is willing to do whatever she needs to do to find housing   Please see past updates related to this goal by clicking on the "Past Updates" button in the selected goal           The patient has been provided with contact information for the care management team and has been advised to call with any health related questions or concerns.     Ronn Melena, [place] Coordination Social Worker [place] [phone]

## 2020-04-27 NOTE — Patient Instructions (Signed)
Visit Information  Goals Addressed            This Visit's Progress   . "I need a place of my own" (pt-stated)       Current Barriers:  Marland Kitchen Knowledge Barriers related to resources available for housing. Received call from patient on 04/26/20 requesting collaboration with Mariah Milling, Corporate treasurer at [facility].  Patient stated that they have an opening for her but need to communicate with Education officer, museum.  Voicemail messages were left for Ms. Negron on 04/26/20 and 04/27/20.  There is no operator to speak to when calling agency, only option is to choose appropriate extension.  Received another call from patient today requesting outreach to Ms. Negron.  Informed her that two messages have been left with no response as of yet.  Patient became upset and disconnected call. Per Care Guide, Ambrose Mantle, patient reported today that she was kicked out of her niece's house.  Patient did not report this during our call.     Case Manager Clinical Goal(s):  Marland Kitchen Over the next 180 days, patient will work with BSW to address needs related to housing. . Over the next 180 days, BSW will collaborate with RN Care Manager to address care management and care coordination needs  Interventions:   . Left second voicemail message for Mariah Milling, Property Site Manager with [employer], for possible housing assistance. . Informed patient that two voicemail messages have been left for Ms. Negron.  Called her again after hanging up with patient and she still did not answer.   . Asked patient to get additional contact information from Ms. Negron if she is contacted by her again . Collaborated with Care Guide, Ambrose Mantle, to inquire if she has additional contact information for Ms. Negron.  Unfortunately, she does not .   Patient Self Care Activities:  . Performs ADL's independently . Performs IADL's independently . Patient says that she is willing to do whatever she needs to do to find  housing   Please see past updates related to this goal by clicking on the "Past Updates" button in the selected goal           The patient has been provided with contact information for the care management team.      Ronn Melena, [place] Worker [place] [phone]

## 2020-04-27 NOTE — Progress Notes (Signed)
Internal Medicine Clinic Resident  I have personally reviewed this encounter including the documentation in this note and/or discussed this patient with the care management provider. I will address any urgent items identified by the care management provider and will communicate my actions to the patient's PCP. I have reviewed the patient's CCM visit with my supervising attending, Dr Butcher.  Marielle Mantione, MD 04/27/2020    

## 2020-04-27 NOTE — Progress Notes (Signed)
Internal Medicine Clinic Resident  I have personally reviewed this encounter including the documentation in this note and/or discussed this patient with the care management provider. I will address any urgent items identified by the care management provider and will communicate my actions to the patient's PCP. I have reviewed the patient's CCM visit with my supervising attending, Dr Butcher.  Rozelle Caudle, MD 04/27/2020    

## 2020-05-01 LAB — HEPATIC FUNCTION PANEL
AG Ratio: 1.4 (calc) (ref 1.0–2.5)
ALT: 32 U/L — ABNORMAL HIGH (ref 6–29)
AST: 36 U/L — ABNORMAL HIGH (ref 10–35)
Albumin: 4.3 g/dL (ref 3.6–5.1)
Alkaline phosphatase (APISO): 96 U/L (ref 37–153)
Bilirubin, Direct: 0.1 mg/dL (ref 0.0–0.2)
Globulin: 3 g/dL (calc) (ref 1.9–3.7)
Indirect Bilirubin: 0.4 mg/dL (calc) (ref 0.2–1.2)
Total Bilirubin: 0.5 mg/dL (ref 0.2–1.2)
Total Protein: 7.3 g/dL (ref 6.1–8.1)

## 2020-05-01 LAB — LIVER FIBROSIS, FIBROTEST-ACTITEST
ALT: 35 U/L — ABNORMAL HIGH (ref 6–29)
Alpha-2-Macroglobulin: 152 mg/dL (ref 106–279)
Apolipoprotein A1: 209 mg/dL — ABNORMAL HIGH (ref 101–198)
Bilirubin: 0.4 mg/dL (ref 0.2–1.2)
Fibrosis Score: 0.08
GGT: 65 U/L (ref 3–70)
Haptoglobin: 136 mg/dL (ref 43–212)
Necroinflammat ACT Score: 0.14
Reference ID: 3418372

## 2020-05-01 LAB — PROTIME-INR
INR: 0.9
Prothrombin Time: 9.8 s (ref 9.0–11.5)

## 2020-05-01 LAB — HEPATITIS C GENOTYPE

## 2020-05-01 LAB — HEPATITIS B SURFACE ANTIGEN: Hepatitis B Surface Ag: NONREACTIVE

## 2020-05-01 LAB — HEPATITIS B SURFACE ANTIBODY,QUALITATIVE: Hep B S Ab: NONREACTIVE

## 2020-05-03 ENCOUNTER — Telehealth: Payer: Medicaid Other

## 2020-05-03 ENCOUNTER — Ambulatory Visit: Payer: Self-pay

## 2020-05-03 DIAGNOSIS — Z5900 Homelessness unspecified: Secondary | ICD-10-CM

## 2020-05-03 NOTE — Patient Instructions (Signed)
Visit Information  Goals Addressed            This Visit's Progress   . "I need a place of my own" (pt-stated)       Current Barriers:  Marland Kitchen Knowledge Barriers related to resources available for housing. Patient currently staying with her cousin.  Remains on the wait list for [place].    Case Manager Clinical Goal(s):  Marland Kitchen Over the next 180 days, patient will work with BSW to address needs related to housing. . Over the next 180 days, BSW will collaborate with RN Care Manager to address care management and care coordination needs  Interventions:   . Informed patient that multiple messages have been left for Mariah Milling with [facility] but return call has not been received as of yet.. . Requested that patient provide Ms. Negron with my contact information and/or get an alternate number for her if she is able to connect with her again.    Patient Self Care Activities:  . Performs ADL's independently . Performs IADL's independently . Patient says that she is willing to do whatever she needs to do to find housing   Please see past updates related to this goal by clicking on the "Past Updates" button in the selected goal         Patient verbalizes understanding of instructions provided today.   The patient has been provided with contact information for the care management team and has been advised to call with any health related questions or concerns.     Ronn Melena, [place] Coordination Social Worker [place] [phone]

## 2020-05-03 NOTE — Progress Notes (Signed)
Internal Medicine Clinic Resident  I have personally reviewed this encounter including the documentation in this note and/or discussed this patient with the care management provider. I will address any urgent items identified by the care management provider and will communicate my actions to the patient's PCP. I have reviewed the patient's CCM visit with my supervising attending, Dr Vincent.  Etoile Looman M Candra Wegner, MD 05/03/2020    

## 2020-05-03 NOTE — Chronic Care Management (AMB) (Signed)
  Care Management   Follow Up Note   05/03/2020 Name: Kimberly [name] MRN: [id number] DOB: 1961/06/19  Referred by: Mosetta Anis, MD Reason for referral : Care Coordination (Housing)   Kimberly [name] is a 59 y.o. year old female who is a primary care patient of [name], MD. The care management team was consulted for assistance with care management and care coordination needs.    Review of patient status, including review of consultants reports, relevant laboratory and other test results, and collaboration with appropriate care team members and the patient's provider was performed as part of comprehensive patient evaluation and provision of chronic care management services.    SDOH (Social Determinants of Health) assessments performed: No See Care Plan activities for detailed interventions related to [facility])     Advanced Directives: See Care Plan and Vynca application for related entries.   Goals Addressed            This Visit's Progress   . "I need a place of my own" (pt-stated)       Current Barriers:  Marland Kitchen Knowledge Barriers related to resources available for housing. Patient currently staying with her cousin.  Remains on the wait list for [place].    Case Manager Clinical Goal(s):  Marland Kitchen Over the next 180 days, patient will work with BSW to address needs related to housing. . Over the next 180 days, BSW will collaborate with RN Care Manager to address care management and care coordination needs  Interventions:   . Informed patient that multiple messages have been left for Mariah Milling with [facility] but return call has not been received as of yet.. . Requested that patient provide Ms. Negron with my contact information and/or get an alternate number for her if she is able to connect with her again.    Patient Self Care Activities:  . Performs ADL's independently . Performs IADL's independently . Patient says that she is willing to do whatever  she needs to do to find housing   Please see past updates related to this goal by clicking on the "Past Updates" button in the selected goal          The patient has been provided with contact information for the care management team and has been advised to call with any health related questions or concerns.      Ronn Melena, [place] Coordination Social Worker [place] [phone]

## 2020-05-03 NOTE — Progress Notes (Signed)
Internal Medicine Clinic Attending  CCM services provided by the care management provider and their documentation were discussed with Dr. Krienke. We reviewed the pertinent findings, urgent action items addressed by the resident and non-urgent items to be addressed by the PCP.  I agree with the assessment, diagnosis, and plan of care documented in the CCM and resident's note.  Jazen Spraggins Thomas Yafet Cline, MD 05/03/2020  

## 2020-05-04 ENCOUNTER — Telehealth: Payer: Self-pay | Admitting: Family

## 2020-05-04 MED ORDER — MAVYRET 100-40 MG PO TABS: 3.0000 | ORAL_TABLET | Freq: Every day | ORAL | 1 refills | Status: AC

## 2020-05-04 NOTE — Telephone Encounter (Signed)
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 Fibrosis score is F0 with corresponding APRI of 0.377 and FIB4 of 1.52. Will plan for treatment with 8 weeks of Mavyret. Attempted to relay results to Kimberly [name] however received voicemail. HIPPA compliant message left.

## 2020-05-05 ENCOUNTER — Telehealth: Payer: Self-pay | Admitting: Pharmacy Technician

## 2020-05-05 NOTE — Telephone Encounter (Signed)
[facility] Patient Advocate Encounter   Received notification from [place] that prior authorization for [place] is required.   PA submitted on 05/05/2020 Key [id number] W Status is pending on nctracks    [facility] will continue to follow.  Bartholomew Crews, CPhT Specialty Pharmacy Patient [facility] for Infectious Disease Phone: [phone] Fax: [phone] 05/05/2020 12:59 PM

## 2020-05-05 NOTE — Telephone Encounter (Signed)
Sent prior authorization via [place] tracks to [place]

## 2020-05-09 NOTE — Telephone Encounter (Signed)
[facility] Patient Advocate Encounter   Prior authorization was DENIED.  I resubmitted prior authorization for MAVYRET.   PA submitted on 05/09/2020 Key [id number] W Status is pending    [facility] will continue to follow.   Venida Jarvis. Nadara Mustard [place] Patient [facility] for Infectious Disease Phone: [phone] Fax:  [phone]

## 2020-05-10 ENCOUNTER — Telehealth: Payer: Medicaid Other

## 2020-05-10 MED FILL — MAVYRET 100-40 MG TABS: 100-40 | 28 days supply | Qty: 84 | Fill #0

## 2020-05-10 NOTE — Telephone Encounter (Addendum)
[facility] Patient Advocate Encounter  Prior Authorization for Mavyret has been approved.    PA# [id number] w Effective dates: 05/09/2020 through 07/08/2020  Patients co-pay is $3.   Left HIPPA complaint voicemail asking her to call back.  She returned call and we will have medication couriered to clinic for her to pick up.  [facility] will continue to follow.  Venida Jarvis. Nadara Mustard [place] Patient [facility] for Infectious Disease Phone: [phone] Fax:  [phone]

## 2020-05-11 ENCOUNTER — Telehealth: Payer: Self-pay | Admitting: Pharmacy Technician

## 2020-05-11 NOTE — Telephone Encounter (Signed)
[facility] Patient Advocate Encounter  Patient's Mavyret has been couriered to [facility] from [employer] and is ready for pick up.  Patient is aware.  She is homeless and has no money and will ask her niece for help to get transportation here.  Beryle Lathe is looking into transportation options through the clinic.  Venida Jarvis. Nadara Mustard [place] Patient [facility] for Infectious Disease Phone: [phone] Fax:  [phone]

## 2020-05-15 ENCOUNTER — Encounter: Payer: Self-pay | Admitting: Pharmacy Technician

## 2020-05-15 ENCOUNTER — Other Ambulatory Visit: Payer: Self-pay

## 2020-05-15 ENCOUNTER — Ambulatory Visit (INDEPENDENT_AMBULATORY_CARE_PROVIDER_SITE_OTHER): Payer: Medicaid Other | Admitting: Pharmacist

## 2020-05-15 DIAGNOSIS — B182 Chronic viral hepatitis C: Secondary | ICD-10-CM

## 2020-05-15 NOTE — Progress Notes (Signed)
HPI: Kimberly [name] is a 59 y.o. female who presents to the [facility] clinic for Hepatitis C follow-up.  Medication: Mavyret  Start Date: 05/15/2020  Hepatitis C Genotype: 1b  Fibrosis Score: F0  Hepatitis C RNA: 11,200,000  Patient Active Problem List   Diagnosis Date Noted  . Chronic hepatitis C without hepatic coma ([place]) 04/18/2020  . Gross hematuria 03/27/2020  . Encounter for screening mammogram for breast cancer 03/27/2020  . Encounter for Papanicolaou smear for cervical cancer screening 03/27/2020  . Bacterial vaginosis 03/27/2020  . Homeless 02/09/2019  . History of nephrolithiasis 01/20/2019  . Bipolar 1 disorder ([place]) 12/08/2018  . Tobacco use disorder   . History of alcohol abuse     Patient's Medications  New Prescriptions   No medications on file  Previous Medications   GLECAPREVIR-PIBRENTASVIR (MAVYRET) 100-40 MG TABS    Take 3 tablets by mouth daily. Take 3 tablets by mouth daily with a meal.   IBUPROFEN (ADVIL) 600 MG TABLET    Take 1 tablet (600 mg total) by mouth every 8 (eight) hours as needed. For back pain   IBUPROFEN-DIPHENHYDRAMINE CIT (ADVIL PM PO)    Take by mouth.  Modified Medications   No medications on file  Discontinued Medications   No medications on file    Allergies: No Known Allergies  Past Medical History: Past Medical History:  Diagnosis Date  . Anxiety   . Bipolar 1 disorder ([place])   . Depression   . Hepatitis C   . Left nephrolithiasis   . Left nephrolithiasis     Social History: Social History   Socioeconomic History  . Marital status: Single    Spouse name: Not on file  . Number of children: Not on file  . Years of education: Not on file  . Highest education level: Not on file  Occupational History  . Not on file  Tobacco Use  . Smoking status: Current Every Day Smoker    Packs/day: 1.00    Types: Cigarettes  . Smokeless tobacco: Never Used  . Tobacco comment: 1 PPD  Vaping Use  . Vaping Use: Never  used  Substance and Sexual Activity  . Alcohol use: Not Currently    Alcohol/week: 1.0 standard drink    Types: 1 Cans of beer per week    Comment: occ  . Drug use: Not Currently    Comment: Pt denies substance use during this TTS assessment on 08-25-14 UDS + for THC  . Sexual activity: Not Currently  Other Topics Concern  . Not on file  Social History Narrative  . Not on file   Social Determinants of Health   Financial Resource Strain: High Risk  . Difficulty of Paying Living Expenses: Very hard  Food Insecurity: Food Insecurity Present  . Worried About Charity fundraiser in the Last Year: Often true  . Ran Out of Food in the Last Year: Often true  Transportation Needs: No Transportation Needs  . Lack of Transportation (Medical): No  . Lack of Transportation (Non-Medical): No  Physical Activity:   . Days of Exercise per Week:   . Minutes of Exercise per Session:   Stress: Stress Concern Present  . Feeling of Stress : Very much  Social Connections:   . Frequency of Communication with Friends and Family:   . Frequency of Social Gatherings with Friends and Family:   . Attends Religious Services:   . Active Member of Clubs or Organizations:   . Attends Club  or Organization Meetings:   Marland Kitchen Marital Status:     Labs: Hepatitis C Lab Results  Component Value Date   HCVGENOTYPE 1b 04/18/2020   FIBROSTAGE F0 04/18/2020   Hepatitis B Lab Results  Component Value Date   HEPBSAB NON-REACTIVE 04/18/2020   HEPBSAG NON-REACTIVE 04/18/2020   Hepatitis A No results found for: HAV HIV Lab Results  Component Value Date   HIV Non Reactive 03/27/2020   HIV Non Reactive 11/18/2018   Lab Results  Component Value Date   CREATININE 0.69 03/27/2020   CREATININE 1.10 (H) 01/21/2019   CREATININE 0.79 01/14/2019   CREATININE 0.63 12/08/2018   CREATININE 0.71 11/21/2018   Lab Results  Component Value Date   AST 36 (H) 04/18/2020   AST 37 03/27/2020   AST 30 11/17/2018   ALT 35  (H) 04/18/2020   ALT 32 (H) 04/18/2020   ALT 32 03/27/2020   INR 0.9 04/18/2020    Assessment: Kimberly [name] is here at clinic today to pick up her [place] for her hepatitis C infection. She is doing well today and grateful to be able to start her treatment for her infection. She will start today.  I told her that [place] is a 3 tablet once a day medication. It is very important to take all three tablets at one time, do not space them out throughout the day. It is generally well tolerated but the most common side effects experienced by patients include headache, nausea, and fatigue. Mavyret should be taken with some food which will help with the nausea and if she needs to take something for her headache we recommend she take ibuprofen instead of tylenol. I also stressed the importance of adherence and that 100% adherence gives her the best chance of curing her infection. We recommend picking a time of day and sticking to it consistently to help patients remember to take it. She did not have anymore questions for me a this time. We will not get labs today, she will come back in a month to get labs and follow up how the treatment is going. She is knows she needs to call [facility] [place] about a week before her appointment so we can set up transportation for her to clinic.    Plan: - Start Mavyret today - Follow up with Cassie 7/19  Nicoletta Dress, PharmD PGY2 Infectious Disease Pharmacy Resident  [place] for Infectious Disease 05/15/2020, 9:56 AM

## 2020-05-24 ENCOUNTER — Other Ambulatory Visit: Payer: Self-pay | Admitting: Pharmacist

## 2020-05-24 DIAGNOSIS — B182 Chronic viral hepatitis C: Secondary | ICD-10-CM

## 2020-06-05 ENCOUNTER — Telehealth: Payer: Medicaid Other

## 2020-06-05 ENCOUNTER — Telehealth: Payer: Self-pay | Admitting: Internal Medicine

## 2020-06-05 NOTE — Chronic Care Management (AMB) (Signed)
  Care Management   Note  06/05/2020 Name: Kimberly [name] MRN: [id number] DOB: 09-30-1961  Kimberly [name] is a 59 y.o. year old female who is a primary care patient of Mosetta Anis, MD and is actively engaged with the care management team. I reached out to Karin Golden by phone today to assist with re-scheduling a follow up visit with the BSW  Follow up plan: Telephone appointment with care management team member scheduled for: 06/13/2020.  [place],  [address] Direct Dial: [phone] [email or web address]: [email or web address]

## 2020-06-07 MED FILL — MAVYRET 100-40 MG TABS: 100-40 | 28 days supply | Qty: 84 | Fill #1

## 2020-06-08 ENCOUNTER — Telehealth: Payer: Self-pay | Admitting: Pharmacy Technician

## 2020-06-08 NOTE — Telephone Encounter (Signed)
[facility] Patient Advocate Encounter  Patient's Mavyret has been couriered to [facility] from [facility] and is ready for pick up.  Venida Jarvis. Nadara Mustard [place] Patient [facility] for Infectious Disease Phone: [phone] Fax:  [phone]

## 2020-06-12 ENCOUNTER — Ambulatory Visit: Payer: Medicaid Other | Admitting: Pharmacist

## 2020-06-12 ENCOUNTER — Other Ambulatory Visit: Payer: Medicaid Other

## 2020-06-12 ENCOUNTER — Other Ambulatory Visit: Payer: Self-pay

## 2020-06-12 DIAGNOSIS — B182 Chronic viral hepatitis C: Secondary | ICD-10-CM

## 2020-06-12 NOTE — Telephone Encounter (Signed)
Patient picked up medication today.  Kimberly [name]. Nadara Mustard [place] Patient [facility] for Infectious Disease Phone: [phone] Fax:  [phone]

## 2020-06-13 ENCOUNTER — Telehealth: Payer: Self-pay | Admitting: Internal Medicine

## 2020-06-13 ENCOUNTER — Ambulatory Visit: Payer: Medicaid Other

## 2020-06-13 DIAGNOSIS — Z5900 Homelessness unspecified: Secondary | ICD-10-CM

## 2020-06-13 DIAGNOSIS — F319 Bipolar disorder, unspecified: Secondary | ICD-10-CM

## 2020-06-13 DIAGNOSIS — B182 Chronic viral hepatitis C: Secondary | ICD-10-CM

## 2020-06-13 NOTE — Patient Instructions (Signed)
Visit Information  Goals Addressed              This Visit's Progress   .  "I need a place of my own" (pt-stated)        Current Barriers:  Marland Kitchen Knowledge Barriers related to resources available for housing. Patient currently staying with her cousin.  Remains on the wait list for [place].  . 06/13/20:  Patient states that she is no longer able to stay with her cousin and is going from "place to place".  States she is now first on the wait list for [place].  Requesting assistance with locating emergency shelter   Case Manager Clinical Goal(s):  Marland Kitchen Over the next 180 days, patient will work with BSW to address needs related to housing. . Over the next 180 days, BSW will collaborate with RN Care Manager to address care management and care coordination needs  Interventions:   . Provided patient with number for [employer] and encouraged her to call and follow up on application as she states she has not been contacted. . Submitted referral to Care Guide to assist with locating emergency shelter.   Patient Self Care Activities:  . Performs ADL's independently . Performs IADL's independently   Please see past updates related to this goal by clicking on the "Past Updates" button in the selected goal         Patient verbalizes understanding of instructions provided today.   The patient has been provided with contact information for the care management team and has been advised to call with any health related/community resource questions or concerns.      Ronn Melena, [place] Coordination Social Worker [place] [phone]

## 2020-06-13 NOTE — Chronic Care Management (AMB) (Signed)
  Care Management   Follow Up Note   06/13/2020 Name: Kimberly [name] MRN: [id number] DOB: February 01, 1961  Referred by: Mosetta Anis, MD Reason for referral : Care Coordination (Housing)   Kimberly [name] is a 59 y.o. year old female who is a primary care patient of [name], MD. The care management team was consulted for assistance with care management and care coordination needs.    Review of patient status, including review of consultants reports, relevant laboratory and other test results, and collaboration with appropriate care team members and the patient's provider was performed as part of comprehensive patient evaluation and provision of chronic care management services.    SDOH (Social Determinants of Health) assessments performed: No See Care Plan activities for detailed interventions related to [facility])     Advanced Directives: See Care Plan and Vynca application for related entries.   Goals Addressed              This Visit's Progress   .  "I need a place of my own" (pt-stated)        Current Barriers:  Marland Kitchen Knowledge Barriers related to resources available for housing. Patient currently staying with her cousin.  Remains on the wait list for [place].  . 06/13/20:  Patient states that she is no longer able to stay with her cousin and is going from "place to place".  States she is now first on the wait list for [place].  Requesting assistance with locating emergency shelter   Case Manager Clinical Goal(s):  Marland Kitchen Over the next 180 days, patient will work with BSW to address needs related to housing. . Over the next 180 days, BSW will collaborate with RN Care Manager to address care management and care coordination needs  Interventions:   . Provided patient with number for [employer] and encouraged her to call and follow up on application as she states she has not been contacted. . Submitted referral to Care Guide to  assist with locating emergency shelter.   Patient Self Care Activities:  . Performs ADL's independently . Performs IADL's independently   Please see past updates related to this goal by clicking on the "Past Updates" button in the selected goal          The patient has been provided with contact information for the care management team and has been advised to call with any health related/community resource questions or concerns.     Ronn Melena, [place] Coordination Social Worker [place] [phone]

## 2020-06-13 NOTE — Telephone Encounter (Signed)
   [place] 06/13/2020   Name: Kimberly [name]   MRN: [id number]   DOB: 10/01/1961   AGE: 59 y.o.   GENDER: female   PCP Mosetta Anis, MD.   Called pt regarding Community Resource Referral for homelessness. Patient stated that she is looking for a shelter to stay in until her apartment becomes available. Per [employer] Chrismon's notes on 06/13/20 patient is the first on the waiting list for [place].  Patient has already put in applications with [place] and [employer] she also stated that [employer] placed a referral for [employer]. Ms. Holcomb that currently she is staying with her niece and that she might be able to stay with her until the 1st of the month, but she really wants to find a shelter. Patient stated she has not received any call back from any of the organization where she put in a referral. Care Guide will try to follow up with the organizations for Ms. Mingo Amber.    [place], Care Management Phone: [phone] Email: [email or web address]

## 2020-06-13 NOTE — Progress Notes (Signed)
Internal Medicine Clinic Resident  I have personally reviewed this encounter including the documentation in this note and/or discussed this patient with the care management provider. I will address any urgent items identified by the care management provider and will communicate my actions to the patient's PCP. I have reviewed the patient's CCM visit with my supervising attending, Dr Jimmye Norman.  Delice Bison, DO 06/13/2020

## 2020-06-14 LAB — HEPATITIS C RNA QUANTITATIVE
HCV Quantitative Log: <1.18 Log IU/mL
HCV RNA, PCR, QN: <15 IU/mL

## 2020-06-15 ENCOUNTER — Ambulatory Visit: Payer: Self-pay

## 2020-06-15 DIAGNOSIS — Z5900 Homelessness unspecified: Secondary | ICD-10-CM

## 2020-06-15 NOTE — Chronic Care Management (AMB) (Signed)
  Care Management   Follow Up Note   06/15/2020 Name: Kimberly [name] MRN: [id number] DOB: 08-15-1961  Referred by: Mosetta Anis, MD Reason for referral : Care Coordination (Housing)   Kimberly [name] is a 59 y.o. year old female who is a primary care patient of [name], MD. The care management team was consulted for assistance with care management and care coordination needs.    Review of patient status, including review of consultants reports, relevant laboratory and other test results, and collaboration with appropriate care team members and the patient's provider was performed as part of comprehensive patient evaluation and provision of chronic care management services.    SDOH (Social Determinants of Health) assessments performed: No See Care Plan activities for detailed interventions related to [facility])     Advanced Directives: See Care Plan and Vynca application for related entries.   Goals Addressed              This Visit's Progress   .  "I need a place of my own" (pt-stated)        Current Barriers:  Marland Kitchen Knowledge Barriers related to resources available for housing. Patient currently staying with her cousin.  Remains on the wait list for [place].  . 06/13/20:  Patient states that she is no longer able to stay with her cousin and is going from "place to place".  States she is now first on the wait list for [place].  Requesting assistance with locating emergency shelter   Case Manager Clinical Goal(s):  Marland Kitchen Over the next 180 days, patient will work with BSW to address needs related to housing. . Over the next 180 days, BSW will collaborate with RN Care Manager to address care management and care coordination needs  Interventions:   . Collaborated with Care Cy Blamer, regarding referral for emergency shelter.  (See her notes dated 06/15/20 for interventions) . Per patient's request, attempted to contact [facility] called multiple times) but was unable to connect with representative . Sent email to general mailbox with [employer] requesting contact about [employer] . Referral to [facility] via [employer] [place] [employer]  Patient Self Care Activities:  . Performs ADL's independently . Performs IADL's independently   Please see past updates related to this goal by clicking on the "Past Updates" button in the selected goal          The patient has been provided with contact information for the care management team and has been advised to call with any health/communtiy resource related questions or concerns.      Ronn Melena, [place] Coordination Social Worker [place] [phone]

## 2020-06-15 NOTE — Telephone Encounter (Signed)
   [place] 06/15/2020   Name: Kimberly [name]   MRN: [id number]   DOB: 1961-05-04   AGE: 59 y.o.   GENDER: female   PCP Mosetta Anis, MD.   Called pt regarding Community Resource Referral for homelessness. Spoke with patient regarding referral. Informed patient that Care Guide has called shelters in the area such [employer], [place], and [place], [employer] do not have any availability at this time. Informed patient that Care Guide was provided with resources for shelters in the [place] area. Patient stated that [place] was too far for her and that she needed to find a shelter in [name]. Informed patient that Care Guide has left messages at [facility] Coordinated line and that someone should be giving her a call back. Kimberly [name] stated that [employer] has already given her a call and she will wait to here from [facility]. Kimberly [name] also asked if Care Guide can check with social worker to see if referral has been placed with [place], Care Management Phone: [phone] Email: [email or web address]

## 2020-06-15 NOTE — Telephone Encounter (Signed)
Called the [employer] [facility] [place]. Left message with patient's name and phone number for a caseworker to give her a call regarding shelter availability.

## 2020-06-15 NOTE — Telephone Encounter (Signed)
Spoke with representative from [employer] regarding availability for homeless shelters. Representative stated that currently they do not have any availability at the moment, recommended that Care Guide call [facility], [employer] in [place], and [employer] in [place]. Care Guide will give Ms. Putnam a call to give her this information.

## 2020-06-15 NOTE — Telephone Encounter (Signed)
Called the [facility] Coordinated Line. Left message with patient's name and phone number for a caseworker to give her a call back within 24 hours.

## 2020-06-15 NOTE — Telephone Encounter (Signed)
Spoke with representative at [facility] regarding shelter availability. Representative stated that currently they do not have any availability, but suggested that Care Guide call the [facility] Coordinated Hotline to see if there is any availability in the area.

## 2020-06-15 NOTE — Progress Notes (Signed)
Internal Medicine Clinic Resident  I have personally reviewed this encounter including the documentation in this note and/or discussed this patient with the care management provider. I will address any urgent items identified by the care management provider and will communicate my actions to the patient's PCP. I have reviewed the patient's CCM visit with my supervising attending, Dr Dareen Piano.  Delice Bison, DO 06/15/2020

## 2020-06-15 NOTE — Patient Instructions (Signed)
Visit Information  Goals Addressed              This Visit's Progress   .  "I need a place of my own" (pt-stated)        Current Barriers:  Marland Kitchen Knowledge Barriers related to resources available for housing. Patient currently staying with her cousin.  Remains on the wait list for [place].  . 06/13/20:  Patient states that she is no longer able to stay with her cousin and is going from "place to place".  States she is now first on the wait list for [place].  Requesting assistance with locating emergency shelter   Case Manager Clinical Goal(s):  Marland Kitchen Over the next 180 days, patient will work with BSW to address needs related to housing. . Over the next 180 days, BSW will collaborate with RN Care Manager to address care management and care coordination needs  Interventions:   . Collaborated with Care Cy Blamer, regarding referral for emergency shelter.  (See her notes dated 06/15/20 for interventions) . Per patient's request, attempted to contact [facility] called multiple times) but was unable to connect with representative . Sent email to general mailbox with [employer] requesting contact about [employer] . Referral to [facility] via [employer] [place] [employer]  Patient Self Care Activities:  . Performs ADL's independently . Performs IADL's independently   Please see past updates related to this goal by clicking on the "Past Updates" button in the selected goal           The patient has been provided with contact information for the care management team and has been advised to call with any health/community  related questions or concerns.     Ronn Melena, [place] Coordination Social Worker [place] [phone]

## 2020-06-16 NOTE — Progress Notes (Signed)
Internal Medicine Clinic Attending  CCM services provided by the care management provider and their documentation were discussed with Dr. Koleen Distance. We reviewed the pertinent findings, urgent action items addressed by the resident and non-urgent items to be addressed by the PCP.  I agree with the assessment, diagnosis, and plan of care documented in the CCM and resident's note.  Aldine Contes, MD 06/16/2020

## 2020-06-19 ENCOUNTER — Telehealth: Payer: Self-pay | Admitting: Pharmacist

## 2020-06-19 NOTE — Telephone Encounter (Signed)
Patient is currently taking Mavyret for 8 weeks for her chronic Hepatitis C infection. She came in a few weeks ago for lab work. Called her to relay results. Her viral load is undetectable. She is doing well on medication and not having any side effects and hasn't missed any doses. She only has a few weeks left of treatment.  She is homeless, so scheduled her an appointment to come in November for her cure visit. Our front staff will coordinate transportation for her. I told her great job for doing so well and to continue taking it without missing any doses until finished. All questions answered.

## 2020-06-20 NOTE — Telephone Encounter (Addendum)
Spoke with patient today regarding her referral for homelessness. Patient stated that she is currently living on the street. She is no longer living with a family member. Ms. Mccroskey stated she has received a call from [facility] and she is waiting for someone to give her a call back. Care Guide informed Ms. Kalka that shelters have been contacted within the area. Ms. Meixner stated understanding. Sent Social Worker a message to let her know the status of Ms. Goosby and that Care Guide has exhausted all resources. Per social worker okay to close referral.   Closing referral pending any other needs of patient.

## 2020-06-22 ENCOUNTER — Ambulatory Visit: Payer: Medicaid Other

## 2020-06-22 DIAGNOSIS — Z5900 Homelessness unspecified: Secondary | ICD-10-CM

## 2020-06-22 NOTE — Progress Notes (Signed)
Internal Medicine Clinic Attending  CCM services provided by the care management provider and their documentation were discussed with Dr. Aslam. We reviewed the pertinent findings, urgent action items addressed by the resident and non-urgent items to be addressed by the PCP.  I agree with the assessment, diagnosis, and plan of care documented in the CCM and resident's note.  Eara Burruel Thomas Sherriann Szuch, MD 06/22/2020  

## 2020-06-22 NOTE — Progress Notes (Signed)
Internal Medicine Clinic Resident  I have personally reviewed this encounter including the documentation in this note and/or discussed this patient with the care management provider. I will address any urgent items identified by the care management provider and will communicate my actions to the patient's PCP. I have reviewed the patient's CCM visit with my supervising attending, Dr Evette Doffing.  Harvie Heck, MD 06/22/2020

## 2020-06-22 NOTE — Patient Instructions (Signed)
Visit Information  Goals Addressed              This Visit's Progress   .  "I need a place of my own" (pt-stated)        Current Barriers:  Marland Kitchen Knowledge Barriers related to resources available for housing. Patient currently staying with her cousin.  Remains on the wait list for [place].  . 06/13/20:  Patient states that she is no longer able to stay with her cousin and is going from "place to place".  States she is now first on the wait list for [place].  Requesting assistance with locating emergency shelter   Case Manager Clinical Goal(s):  Marland Kitchen Over the next 180 days, patient will work with BSW to address needs related to housing. . Over the next 180 days, BSW will collaborate with RN Care Manager to address care management and care coordination needs  Interventions:   . Followed up on referral to [facility] regarding referral for emergency housing; referral closed as all local shelters are at capacity . Will submit referral to [facility] for "permanent housing" so that patient can hopefully be assigned  a case manager  Patient Self Care Activities:  . Performs ADL's independently . Performs IADL's independently   Please see past updates related to this goal by clicking on the "Past Updates" button in the selected goal           The patient has been provided with contact information for the care management team and has been advised to call with any health/community resource related questions or concerns.       Ronn Melena, [place] Coordination Social Worker [place] [phone]

## 2020-06-22 NOTE — Chronic Care Management (AMB) (Signed)
  Care Management   Follow Up Note   06/22/2020 Name: Kimberly [name] MRN: [id number] DOB: [birth year]  Referred by: Mosetta Anis, MD Reason for referral : Care Coordination (Housing)   Kimberly [name] is a 59 y.o. year old female who is a primary care patient of Kimberly Hayward Dimple Nanas, MD. The care management team was consulted for assistance with care management and care coordination [name].    Review of patient status, including review of consultants reports, relevant laboratory and other test results, and collaboration with appropriate care team members and the patient's provider was performed as part of comprehensive patient evaluation and provision of chronic care management services.    SDOH (Social Determinants of Health) assessments performed: No See Care Plan activities for detailed interventions related to [facility])     Advanced Directives: See Care Plan and Vynca application for related entries.   Goals Addressed              This Visit's Progress   .  "I need a place of my own" (pt-stated)        Current Barriers:  Marland Kitchen Knowledge Barriers related to resources available for housing. Patient currently staying with her cousin.  Remains on the wait list for [place].  . 06/13/20:  Patient states that she is no longer able to stay with her cousin and is going from "place to place".  States she is now first on the wait list for [place].  Requesting assistance with locating emergency shelter   Case Manager Clinical Goal(s):  Marland Kitchen Over the next 180 days, patient will work with BSW to address [name] related to housing. . Over the next 180 days, BSW will collaborate with RN Care Manager to address care management and care coordination [name]  Interventions:   . Followed up on referral to [facility] regarding referral for emergency housing; referral closed as all local shelters are at capacity . Will submit referral to [facility] for "permanent housing" so that patient can hopefully be assigned  a case manager  Patient Self Care Activities:  . Performs ADL's independently . Performs IADL's independently   Please see past updates related to this goal by clicking on the "Past Updates" button in the selected goal          The patient has been provided with contact information for the care management team and has been advised to call with any health/community resource related questions or concerns.     Ronn Melena, [place] Coordination Social Worker [place] [phone]

## 2020-06-23 ENCOUNTER — Ambulatory Visit: Payer: Medicaid Other

## 2020-06-23 DIAGNOSIS — Z5900 Homelessness unspecified: Secondary | ICD-10-CM

## 2020-06-23 NOTE — Progress Notes (Signed)
Internal Medicine Clinic Resident  I have personally reviewed this encounter including the documentation in this note and/or discussed this patient with the care management provider. I will address any urgent items identified by the care management provider and will communicate my actions to the patient's PCP. I have reviewed the patient's CCM visit with my supervising attending, Dr Vincent.  Eva Griffo, MD 06/23/2020    

## 2020-06-23 NOTE — Chronic Care Management (AMB) (Signed)
  Care Management   Follow Up Note   06/23/2020 Name: Kimberly [name] MRN: [id number] DOB: 09/07/1961  Referred by: Mosetta Anis, MD Reason for referral : Care Coordination (Housing)   Kimberly [name] is a 59 y.o. year old female who is a primary care patient of [name], MD. The care management team was consulted for assistance with care management and care coordination needs.    Review of patient status, including review of consultants reports, relevant laboratory and other test results, and collaboration with appropriate care team members and the patient's provider was performed as part of comprehensive patient evaluation and provision of chronic care management services.    SDOH (Social Determinants of Health) assessments performed: No See Care Plan activities for detailed interventions related to [facility])     Advanced Directives: See Care Plan and Vynca application for related entries.   Goals Addressed              This Visit's Progress   .  "I need a place of my own" (pt-stated)        Current Barriers:  Marland Kitchen Knowledge Barriers related to resources available for housing. Patient currently staying with her cousin.  Remains on the wait list for [place].  . 06/13/20:  Patient states that she is no longer able to stay with her cousin and is going from "place to place".  States she is now first on the wait list for [place].  Requesting assistance with locating emergency shelter   Case Manager Clinical Goal(s):  Marland Kitchen Over the next 180 days, patient will work with BSW to address needs related to housing. . Over the next 180 days, BSW will collaborate with RN Care Manager to address care management and care coordination needs  Interventions:   . Referral submitted to [facility] for "permanent housing" so that patient can hopefully be assigned  a case manager  Patient Self Care Activities:  . Performs ADL's  independently . Performs IADL's independently   Please see past updates related to this goal by clicking on the "Past Updates" button in the selected goal          The care management team will reach out to the patient again over the next 14 days.       Ronn Melena, [place] Coordination Social Worker [place] [phone]

## 2020-06-23 NOTE — Progress Notes (Signed)
Internal Medicine Clinic Attending  CCM services provided by the care management provider and their documentation were discussed with Dr. Aslam. We reviewed the pertinent findings, urgent action items addressed by the resident and non-urgent items to be addressed by the PCP.  I agree with the assessment, diagnosis, and plan of care documented in the CCM and resident's note.  Jalie Eiland Thomas Dearies Meikle, MD 06/23/2020  

## 2020-06-23 NOTE — Patient Instructions (Signed)
Visit Information  Goals Addressed              This Visit's Progress   .  "I need a place of my own" (pt-stated)        Current Barriers:  Marland Kitchen Knowledge Barriers related to resources available for housing. Patient currently staying with her cousin.  Remains on the wait list for [place].  . 06/13/20:  Patient states that she is no longer able to stay with her cousin and is going from "place to place".  States she is now first on the wait list for [place].  Requesting assistance with locating emergency shelter   Case Manager Clinical Goal(s):  Marland Kitchen Over the next 180 days, patient will work with BSW to address needs related to housing. . Over the next 180 days, BSW will collaborate with RN Care Manager to address care management and care coordination needs  Interventions:   . Referral submitted to [facility] for "permanent housing" so that patient can hopefully be assigned  a case manager  Patient Self Care Activities:  . Performs ADL's independently . Performs IADL's independently   Please see past updates related to this goal by clicking on the "Past Updates" button in the selected goal           The patient has been provided with contact information for the care management team and has been advised to call with any health related questions or concerns.     Ronn Melena, [place] Coordination Social Worker [place] [phone]

## 2020-07-07 ENCOUNTER — Ambulatory Visit: Payer: Medicaid Other

## 2020-07-07 DIAGNOSIS — Z5900 Homelessness unspecified: Secondary | ICD-10-CM

## 2020-07-07 DIAGNOSIS — F319 Bipolar disorder, unspecified: Secondary | ICD-10-CM

## 2020-07-07 DIAGNOSIS — B182 Chronic viral hepatitis C: Secondary | ICD-10-CM

## 2020-07-07 NOTE — Progress Notes (Signed)
Internal Medicine Clinic Resident  I have personally reviewed this encounter including the documentation in this note and/or discussed this patient with the care management provider. I will address any urgent items identified by the care management provider and will communicate my actions to the patient's PCP. I have reviewed the patient's CCM visit with my supervising attending, Dr Heber Pierce.  Sanjuana Letters, MD 07/07/2020

## 2020-07-07 NOTE — Patient Instructions (Signed)
Visit Information  Goals Addressed              This Visit's Progress   .  "I need a place of my own" (pt-stated)        Current Barriers:  Marland Kitchen Knowledge Barriers related to resources available for housing. Patient currently staying with her cousin.  Remains on the wait list for [place].  . 06/13/20:  Patient states that she is no longer able to stay with her cousin and is going from "place to place".  States she is now first on the wait list for [place].  Requesting assistance with locating emergency shelter   Case Manager Clinical Goal(s):  Marland Kitchen Over the next 180 days, patient will work with BSW to address needs related to housing. . Over the next 180 days, BSW will collaborate with RN Care Manager to address care management and care coordination needs  Interventions:    Collaborated with Armida Sans, [place], Project Coordinator, regarding status of referral to [employer]; Ms. Angela Adam communicated with Benita at [facility] who plans to contact patient today, 07/07/20   Patient Self Care Activities:  . Performs ADL's independently . Performs IADL's independently   Please see past updates related to this goal by clicking on the "Past Updates" button in the selected goal         Patient verbalizes understanding of instructions provided today.   The care management team will reach out to the patient again over the next 14 days.     Ronn Melena, [place] Coordination Social Worker [place] [phone]

## 2020-07-07 NOTE — Chronic Care Management (AMB) (Signed)
  Care Management   Follow Up Note   07/07/2020 Name: Kimberly [name] MRN: [id number] DOB: 1961-05-03  Referred by: Mosetta Anis, MD Reason for referral : Care Coordination (housing)   Kimberly [name] is a 59 y.o. year old female who is a primary care patient of Kimberly Hayward Dimple Nanas, MD. The care management team was consulted for assistance with care management and care coordination needs.    Review of patient status, including review of consultants reports, relevant laboratory and other test results, and collaboration with appropriate care team members and the patient's provider was performed as part of comprehensive patient evaluation and provision of chronic care management services.    SDOH (Social Determinants of Health) assessments performed: No See Care Plan activities for detailed interventions related to [facility])     Advanced Directives: See Care Plan and Vynca application for related entries.   Goals Addressed              This Visit's Progress   .  "I need a place of my own" (pt-stated)        Current Barriers:  Kimberly [name] Knowledge Barriers related to resources available for housing. Patient currently staying with her cousin.  Remains on the wait list for [place].  . 06/13/20:  Patient states that she is no longer able to stay with her cousin and is going from "place to place".  States she is now first on the wait list for [place].  Requesting assistance with locating emergency shelter   Case Manager Clinical Goal(s):  Kimberly [name] Over the next 180 days, patient will work with BSW to address needs related to housing. . Over the next 180 days, BSW will collaborate with RN Care Manager to address care management and care coordination needs  Interventions:    Collaborated with Kimberly [name], Kimberly [name], Project Coordinator, regarding status of referral to [employer]; [name] communicated with Kimberly [name] at Kimberly [name] who plans to contact patient  today, 07/07/20   Patient Self Care Activities:  . Performs ADL's independently . Performs IADL's independently   Please see past updates related to this goal by clicking on the "Past Updates" button in the selected goal          The care management team will reach out to the patient again over the next 14 days.     Kimberly [name], [place] Coordination Social Worker [name] [phone]

## 2020-07-11 ENCOUNTER — Ambulatory Visit: Payer: Medicaid Other

## 2020-07-11 DIAGNOSIS — F319 Bipolar disorder, unspecified: Secondary | ICD-10-CM

## 2020-07-11 DIAGNOSIS — B182 Chronic viral hepatitis C: Secondary | ICD-10-CM

## 2020-07-11 DIAGNOSIS — Z5900 Homelessness unspecified: Secondary | ICD-10-CM

## 2020-07-11 NOTE — Patient Instructions (Signed)
Visit Information  Goals Addressed              This Visit's Progress   .  "I need a place of my own" (pt-stated)        Current Barriers:  Marland Kitchen Knowledge Barriers related to resources available for housing. Patient currently staying with her cousin.  Remains on the wait list for [place].  . 06/13/20:  Patient states that she is no longer able to stay with her cousin and is going from "place to place".  States she is now first on the wait list for [place].  Requesting assistance with locating emergency shelter   Case Manager Clinical Goal(s):  Marland Kitchen Over the next 180 days, patient will work with BSW to address needs related to housing. . Over the next 180 days, BSW will collaborate with RN Care Manager to address care management and care coordination needs  Interventions:    Collaborated with Armida Sans, [place], Project Coordinator, due to inability to see status of permanent housing referral to [facility] via [employer] [place] platform.  Received the following update from Ms. Sutton/response from [employer] that see walk-ins on break until Oct. Client does not seem to be eligible for other IRC case management at this time, she can come in for dayroom services"  Ms. Angela Adam plans to speak with Benita at [facility] this afternoon for additional information     Patient Self Care Activities:  . Performs ADL's independently . Performs IADL's independently   Please see past updates related to this goal by clicking on the "Past Updates" button in the selected goal           The patient has been provided with contact information for the care management team and has been advised to call with any health/community resource related questions or concerns.     Ronn Melena, [place] Coordination Social Worker [place] [phone]

## 2020-07-11 NOTE — Progress Notes (Signed)
Internal Medicine Clinic Resident  I have personally reviewed this encounter including the documentation in this note and/or discussed this patient with the care management provider. I will address any urgent items identified by the care management provider and will communicate my actions to the patient's PCP. I have reviewed the patient's CCM visit with my supervising attending, Dr Rebeca Alert.  Dewayne Hatch, MD 07/11/2020

## 2020-07-11 NOTE — Chronic Care Management (AMB) (Signed)
  Care Management   Follow Up Note   07/11/2020 Name: Kimberly [name] MRN: [id number] DOB: 05/01/1961  Referred by: Kimberly Anis, MD Reason for referral : Care Coordination (Housing)   Kimberly [name] is a 59 y.o. year old female who is a primary care patient of Kimberly [name] Kimberly Nanas, MD. The care management team was consulted for assistance with care management and care coordination needs.    Review of patient status, including review of consultants reports, relevant laboratory and other test results, and collaboration with appropriate care team members and the patient's provider was performed as part of comprehensive patient evaluation and provision of chronic care management services.    SDOH (Social Determinants of Health) assessments performed: No See Care Plan activities for detailed interventions related to [facility])     Advanced Directives: See Care Plan and Vynca application for related entries.   Goals Addressed              This Visit's Progress   .  "I need a place of my own" (pt-stated)        Current Barriers:  Marland Kitchen Knowledge Barriers related to resources available for housing. Patient currently staying with her cousin.  Remains on the wait list for [place].  . 06/13/20:  Patient states that she is no longer able to stay with her cousin and is going from "place to place".  States she is now first on the wait list for [place].  Requesting assistance with locating emergency shelter   Case Manager Clinical Goal(s):  Marland Kitchen Over the next 180 days, patient will work with BSW to address needs related to housing. . Over the next 180 days, BSW will collaborate with RN Care Manager to address care management and care coordination needs  Interventions:    Collaborated with Kimberly [name], [place], Project Coordinator, due to inability to see status of permanent housing referral to [facility] via [employer] [place] platform.  Received the following update from Ms.  [name]/response from [employer] that see walk-ins on break until Oct. Client does not seem to be eligible for other IRC case management at this time, she can come in for dayroom services"  Ms. Angela Adam plans to speak with Kimberly [name] at [facility] this afternoon for additional information     Patient Self Care Activities:  . Performs ADL's independently . Performs IADL's independently   Please see past updates related to this goal by clicking on the "Past Updates" button in the selected goal          The patient has been provided with contact information for the care management team and has been advised to call with any health/community resource related questions or concerns.      Ronn Melena, [place] Coordination Social Worker [place] [phone]

## 2020-07-12 ENCOUNTER — Ambulatory Visit: Payer: Medicaid Other

## 2020-07-12 DIAGNOSIS — Z5900 Homelessness unspecified: Secondary | ICD-10-CM

## 2020-07-12 NOTE — Progress Notes (Signed)
Internal Medicine Clinic Attending  CCM services provided by the care management provider and their documentation were reviewed with Dr. Myrtie Hawk.  We reviewed the pertinent findings, urgent action items addressed by the resident and non-urgent items to be addressed by the PCP.  I agree with the assessment, diagnosis, and plan of care documented in the CCM and resident's note.  Oda Kilts, MD 07/12/2020

## 2020-07-13 NOTE — Patient Instructions (Signed)
Visit Information  Goals Addressed              This Visit's Progress   .  "I need a place of my own" (pt-stated)        Current Barriers:  Marland Kitchen Knowledge Barriers related to resources available for housing. Patient currently staying with her cousin.  Remains on the wait list for [place].  . 06/13/20:  Patient states that she is no longer able to stay with her cousin and is going from "place to place".  States she is now first on the wait list for [place].  Requesting assistance with locating emergency shelter   Case Manager Clinical Goal(s):  Marland Kitchen Over the next 180 days, patient will work with BSW to address needs related to housing. . Over the next 180 days, BSW will collaborate with RN Care Manager to address care management and care coordination needs  Interventions:    Collaborated with Armida Sans, [facility], Project Coordinator, regarding status of referral to [employer]. Ms. Angela Adam is awaiting clarification about eligibility for [facility] Case Management services.  Per Ms. Angela Adam , patient was advised by [facility] staff to contact Coordinated Entry.     Patient Self Care Activities:  . Performs ADL's independently . Performs IADL's independently   Please see past updates related to this goal by clicking on the "Past Updates" button in the selected goal           The patient has been provided with contact information for the care management team and has been advised to call with any health related questions or concerns.       Ronn Melena, [place] Coordination Social Worker [place] [phone]

## 2020-07-13 NOTE — Chronic Care Management (AMB) (Signed)
  Care Management   Follow Up Note   07/13/2020 Name: Brooke Steinhilber MRN: [id number] DOB: 07/11/1961  Referred by: Mosetta Anis, MD Reason for referral : Care Coordination (Housing)   Keilyn Haggard is a 59 y.o. year old female who is a primary care patient of [name], MD. The care management team was consulted for assistance with care management and care coordination needs.    Review of patient status, including review of consultants reports, relevant laboratory and other test results, and collaboration with appropriate care team members and the patient's provider was performed as part of comprehensive patient evaluation and provision of chronic care management services.    SDOH (Social Determinants of Health) assessments performed: No See Care Plan activities for detailed interventions related to [facility])     Advanced Directives: See Care Plan and Vynca application for related entries.   Goals Addressed              This Visit's Progress   .  "I need a place of my own" (pt-stated)        Current Barriers:  Marland Kitchen Knowledge Barriers related to resources available for housing. Patient currently staying with her cousin.  Remains on the wait list for [place].  . 06/13/20:  Patient states that she is no longer able to stay with her cousin and is going from "place to place".  States she is now first on the wait list for [place].  Requesting assistance with locating emergency shelter   Case Manager Clinical Goal(s):  Marland Kitchen Over the next 180 days, patient will work with BSW to address needs related to housing. . Over the next 180 days, BSW will collaborate with RN Care Manager to address care management and care coordination needs  Interventions:    Collaborated with Armida Sans, [facility] 360, Project Coordinator, regarding status of referral to [employer]. Ms. Angela Adam is awaiting clarification about eligibility for [facility] Case  Management services.  Per Ms. Angela Adam , patient was advised by [facility] staff to contact [employer].     Patient Self Care Activities:  . Performs ADL's independently . Performs IADL's independently   Please see past updates related to this goal by clicking on the "Past Updates" button in the selected goal          The patient has been provided with contact information for the care management team and has been advised to call with any health related questions or concerns.      Ronn Melena, [place] Coordination Social Worker [place] [phone]

## 2020-07-13 NOTE — Progress Notes (Signed)
Internal Medicine Clinic Resident  I have personally reviewed this encounter including the documentation in this note and/or discussed this patient with the care management provider. I will address any urgent items identified by the care management provider and will communicate my actions to the patient's PCP. I have reviewed the patient's CCM visit with my supervising attending, Dr Hoffman.  Sheba Whaling Y Takeisha Cianci, MD 07/13/2020    

## 2020-07-21 ENCOUNTER — Telehealth: Payer: Self-pay

## 2020-07-21 ENCOUNTER — Telehealth: Payer: Medicaid Other

## 2020-07-21 NOTE — Telephone Encounter (Signed)
  Chronic Care Management   Outreach Note  07/21/2020 Name: Kimberly [name] MRN: [id number] DOB: Sep 08, 1961  Referred by: Mosetta Anis, MD Reason for referral : Care Coordination (Housing)   An unsuccessful telephone outreach was attempted today. The patient was referred to the case management team for assistance with care management and care coordination.   Follow Up Plan: A HIPPA compliant phone message was left for the patient providing contact information and requesting a return call.      Ronn Melena, [place] Coordination Social Worker [place] [phone]

## 2020-07-27 ENCOUNTER — Ambulatory Visit: Payer: Medicaid Other

## 2020-07-27 DIAGNOSIS — B182 Chronic viral hepatitis C: Secondary | ICD-10-CM

## 2020-07-27 DIAGNOSIS — F319 Bipolar disorder, unspecified: Secondary | ICD-10-CM

## 2020-07-27 NOTE — Patient Instructions (Addendum)
Visit Information  Goals Addressed              This Visit's Progress   .  "I need a place of my own" (Completed)        Current Barriers:  Marland Kitchen Knowledge Barriers related to resources available for housing. Patient currently staying with her cousin.  Remains on the wait list for [place].  . 06/13/20:  Patient states that she is no longer able to stay with her cousin and is going from "place to place".  States she is now first on the wait list for [place].  Requesting assistance with locating emergency shelter . 07/27/20:  Patient reports today that she has secure transitional housing in which she can stay until unit at [facility] becomes available.     Case Manager Clinical Goal(s):  Marland Kitchen Over the next 180 days, patient will work with BSW to address needs related to housing. . Over the next 180 days, BSW will collaborate with RN Care Manager to address care management and care coordination needs  Interventions:    Closing goal of care plan as patient reports that she has secured housing.     Patient Self Care Activities:  . Performs ADL's independently . Performs IADL's independently   Please see past updates related to this goal by clicking on the "Past Updates" button in the selected goal         Patient verbalizes understanding of instructions provided today.   The patient has been provided with contact information for the care management team and has been advised to call with any health/community resource related questions or concerns.      Ronn Melena, [place] Coordination Social Worker [place] [phone]

## 2020-07-27 NOTE — Progress Notes (Signed)
Internal Medicine Clinic Resident  I have personally reviewed this encounter including the documentation in this note and/or discussed this patient with the care management provider. I will address any urgent items identified by the care management provider and will communicate my actions to the patient's PCP. I have reviewed the patient's CCM visit with my supervising attending, Dr Vincent.  Victoriah Wilds K Ruthe Roemer, MD 07/27/2020   

## 2020-07-27 NOTE — Progress Notes (Signed)
Internal Medicine Clinic Attending  CCM services provided by the care management provider and their documentation were discussed with Dr. Agyei. We reviewed the pertinent findings, urgent action items addressed by the resident and non-urgent items to be addressed by the PCP.  I agree with the assessment, diagnosis, and plan of care documented in the CCM and resident's note.  Raiana Pharris Thomas Labrenda Lasky, MD 07/27/2020  

## 2020-07-27 NOTE — Chronic Care Management (AMB) (Signed)
  Care Management   Follow Up Note   07/27/2020 Name: Kimberly [name] MRN: [id number] DOB: 07-08-1961  Referred by: Mosetta Anis, MD Reason for referral : Care Coordination (Housing)   Kimberly [name] is a 59 y.o. year old female who is a primary care patient of [name], MD. The care management team was consulted for assistance with care management and care coordination needs.    Review of patient status, including review of consultants reports, relevant laboratory and other test results, and collaboration with appropriate care team members and the patient's provider was performed as part of comprehensive patient evaluation and provision of chronic care management services.    SDOH (Social Determinants of Health) assessments performed: No See Care Plan activities for detailed interventions related to [facility])     Advanced Directives: See Care Plan and Vynca application for related entries.   Goals Addressed              This Visit's Progress   .  "I need a place of my own" (Completed)        Current Barriers:  Marland Kitchen Knowledge Barriers related to resources available for housing. Patient currently staying with her cousin.  Remains on the wait list for [place].  . 06/13/20:  Patient states that she is no longer able to stay with her cousin and is going from "place to place".  States she is now first on the wait list for [place].  Requesting assistance with locating emergency shelter . 07/27/20:  Patient reports today that she has secure transitional housing in which she can stay until unit at [facility] becomes available.     Case Manager Clinical Goal(s):  Marland Kitchen Over the next 180 days, patient will work with BSW to address needs related to housing. . Over the next 180 days, BSW will collaborate with RN Care Manager to address care management and care coordination needs  Interventions:    Closing goal of care plan as patient reports  that she has secured housing.     Patient Self Care Activities:  . Performs ADL's independently . Performs IADL's independently   Please see past updates related to this goal by clicking on the "Past Updates" button in the selected goal          The patient has been provided with contact information for the care management team and has been advised to call with any health/community resource related questions or concerns.      Ronn Melena, [place] Coordination Social Worker [place] [phone]

## 2020-08-30 ENCOUNTER — Ambulatory Visit: Payer: Self-pay

## 2020-08-30 ENCOUNTER — Telehealth: Payer: Self-pay

## 2020-08-30 NOTE — Telephone Encounter (Signed)
I can see her in clinic. I rather than see me than her not see anybody at all.

## 2020-08-30 NOTE — Telephone Encounter (Addendum)
Called pt - stated continues to have blood in her urine and dark stools. I asked about the chest pain she was c/o earlier; stated the pain comes and goes mainly   when she coughs. And when she coughs, her "whole left side hurts". Appt scheduled tomorrow @ 48 AM. Pt informed to go to ED if pain worsens; stated she knows when she needs to the ED.
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
## 2020-08-30 NOTE — Chronic Care Management (AMB) (Signed)
CCM status changed to previously enrolled as goals of care plan have been completed.    Ronn Melena, [place] Coordination Social Worker [place] [phone]

## 2020-08-31 ENCOUNTER — Telehealth: Payer: Self-pay | Admitting: Internal Medicine

## 2020-08-31 ENCOUNTER — Other Ambulatory Visit: Payer: Self-pay

## 2020-08-31 ENCOUNTER — Ambulatory Visit (HOSPITAL_COMMUNITY)
Admission: RE | Admit: 2020-08-31 | Discharge: 2020-08-31 | Disposition: A | Discharge status: Home / Self Care | Payer: Medicaid Other | Source: Ambulatory Visit | Attending: Internal Medicine | Admitting: Internal Medicine

## 2020-08-31 ENCOUNTER — Ambulatory Visit (INDEPENDENT_AMBULATORY_CARE_PROVIDER_SITE_OTHER): Payer: Medicaid Other | Admitting: Internal Medicine

## 2020-08-31 ENCOUNTER — Encounter: Payer: Self-pay | Admitting: Internal Medicine

## 2020-08-31 VITALS — BP 128/80 | HR 82 | Temp 98.3°F | Ht 67.0 in | Wt 143.5 lb

## 2020-08-31 DIAGNOSIS — R310 Gross hematuria: Secondary | ICD-10-CM

## 2020-08-31 DIAGNOSIS — S299XXA Unspecified injury of thorax, initial encounter: Secondary | ICD-10-CM | POA: Diagnosis not present

## 2020-08-31 MED ORDER — MELOXICAM 7.5 MG PO TABS: 7.5000 mg | ORAL_TABLET | Freq: Every day | ORAL | 0 refills | Status: AC

## 2020-08-31 NOTE — Patient Instructions (Signed)
Thank you for allowing [place] to provide your care today. Today we discussed your chest pain    I have ordered cbc, cmp labs for you. I will call if any are abnormal.    Today we made the following changes to your medications.    Please take meloxicam 7.5mg  as needed for pain. You may take additional doses if needed  Please follow-up as needed.    Should you have any questions or concerns please call the internal medicine clinic at [phone].     Rib Fracture  A rib fracture is a break or crack in one of the bones of the ribs. The ribs are like a cage that goes around your upper chest. A broken or cracked rib is often painful, but most do not cause other problems. Most rib fractures usually heal on their own in 1-3 months. Follow these instructions at home: Managing pain, stiffness, and swelling  If directed, apply ice to the injured area. ? Put ice in a plastic bag. ? Place a towel between your skin and the bag. ? Leave the ice on for 20 minutes, 2-3 times a day.  Take over-the-counter and prescription medicines only as told by your doctor. Activity  Avoid activities that cause pain to the injured area. Protect your injured area.  Slowly increase activity as told by your doctor. General instructions  Do deep breathing as told by your doctor. You may be told to: ? Take deep breaths many times a day. ? Cough many times a day while hugging a pillow. ? Use a device (incentive spirometer) to do deep breathing many times a day.  Drink enough fluid to keep your pee (urine) clear or pale yellow.  Do not wear a rib belt or binder. These do not allow you to breathe deeply.  Keep all follow-up visits as told by your doctor. This is important. Contact a doctor if:  You have a fever. Get help right away if:  You have trouble breathing.  You are short of breath.  You cannot stop coughing.  You cough up thick or bloody spit (sputum).  You feel sick to your stomach (nauseous),  throw up (vomit), or have belly (abdominal) pain.  Your pain gets worse and medicine does not help. Summary  A rib fracture is a break or crack in one of the bones of the ribs.  Apply ice to the injured area and take medicines for pain as told by your doctor.  Take deep breaths and cough many times a day. Hug a pillow every time you cough. This information is not intended to replace advice given to you by your health care provider. Make sure you discuss any questions you have with your health care provider. Document Revised: 10/24/2017 Document Reviewed: 02/11/2017 Elsevier Patient Education  2020 [employer].

## 2020-08-31 NOTE — Telephone Encounter (Signed)
Spoke with Kimberly [name] and informed her of the X-ray results. Kimberly [name] expressed understanding.

## 2020-09-01 ENCOUNTER — Encounter: Payer: Self-pay | Admitting: Internal Medicine

## 2020-09-01 DIAGNOSIS — S299XXA Unspecified injury of thorax, initial encounter: Secondary | ICD-10-CM | POA: Insufficient documentation

## 2020-09-01 LAB — CMP14 + ANION GAP
ALT: 13 IU/L (ref 0–32)
AST: 20 IU/L (ref 0–40)
Albumin/Globulin Ratio: 1.4 (ref 1.2–2.2)
Albumin: 4.6 g/dL (ref 3.8–4.9)
Alkaline Phosphatase: 99 IU/L (ref 44–121)
Anion Gap: 19 mmol/L — ABNORMAL HIGH (ref 10.0–18.0)
BUN/Creatinine Ratio: 14 (ref 9–23)
BUN: 9 mg/dL (ref 6–24)
Bilirubin Total: <0.2 mg/dL (ref 0.0–1.2)
CO2: 19 mmol/L — ABNORMAL LOW (ref 20–29)
Calcium: 9.7 mg/dL (ref 8.7–10.2)
Chloride: 100 mmol/L (ref 96–106)
Creatinine, Ser: 0.63 mg/dL (ref 0.57–1.00)
GFR calc Af Amer: 115 mL/min/{1.73_m2} (ref >59)
GFR calc non Af Amer: 100 mL/min/{1.73_m2} (ref >59)
Globulin, Total: 3.2 g/dL (ref 1.5–4.5)
Glucose: 79 mg/dL (ref 65–99)
Potassium: 4.4 mmol/L (ref 3.5–5.2)
Sodium: 138 mmol/L (ref 134–144)
Total Protein: 7.8 g/dL (ref 6.0–8.5)

## 2020-09-01 LAB — CBC WITH DIFFERENTIAL/PLATELET
Basophils Absolute: 0.1 10*3/uL (ref 0.0–0.2)
Basos: 0 %
EOS (ABSOLUTE): 0.1 10*3/uL (ref 0.0–0.4)
Eos: 1 %
Hematocrit: 40.6 % (ref 34.0–46.6)
Hemoglobin: 13.7 g/dL (ref 11.1–15.9)
Immature Grans (Abs): 0 10*3/uL (ref 0.0–0.1)
Immature Granulocytes: 0 %
Lymphocytes Absolute: 2.1 10*3/uL (ref 0.7–3.1)
Lymphs: 17 %
MCH: 34.7 pg — ABNORMAL HIGH (ref 26.6–33.0)
MCHC: 33.7 g/dL (ref 31.5–35.7)
MCV: 103 fL — ABNORMAL HIGH (ref 79–97)
Monocytes Absolute: 0.7 10*3/uL (ref 0.1–0.9)
Monocytes: 6 %
Neutrophils Absolute: 9.6 10*3/uL — ABNORMAL HIGH (ref 1.4–7.0)
Neutrophils: 76 %
Platelets: 303 10*3/uL (ref 150–450)
RBC: 3.95 x10E6/uL (ref 3.77–5.28)
RDW: 11.8 % (ref 11.7–15.4)
WBC: 12.6 10*3/uL — ABNORMAL HIGH (ref 3.4–10.8)
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
## 2020-09-01 NOTE — Assessment & Plan Note (Signed)
Mentions continuing to endorse intermittent hematuria. Last episode about a week ago although this morning she denies any. States that she was trying to make an appointment with urology but was told she needed to pay $2000. Advised Kimberly [name] that she should reach out to them again to clarify as her visit should be covered under medicaid.  - Cbc to assess for anemia - Advised to f/u with urology
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
## 2020-09-05 ENCOUNTER — Other Ambulatory Visit: Payer: Self-pay | Admitting: Internal Medicine

## 2020-09-05 DIAGNOSIS — R310 Gross hematuria: Secondary | ICD-10-CM

## 2020-09-05 NOTE — Progress Notes (Signed)
Patient requiring new referral for urology as prior referral was placed and expired while patient was awaiting approval for Medicaid

## 2020-09-05 NOTE — Progress Notes (Signed)
Internal Medicine Clinic Attending  Case discussed with Dr. Lee  At the time of the visit.  We reviewed the resident's history and exam and pertinent patient test results.  I agree with the assessment, diagnosis, and plan of care documented in the resident's note.    

## 2020-09-06 ENCOUNTER — Ambulatory Visit: Payer: Medicaid Other

## 2020-09-06 NOTE — Chronic Care Management (AMB) (Signed)
  Chronic Care Management   Outreach Note  09/06/2020 Name: Kimberly [name] MRN: [id number] DOB: 08-05-1961  Referred by: Mosetta Anis, MD Reason for referral : No chief complaint on file.  Per Dr. Truman Hayward, patient requested outreach from [facility] B[facility] during last office visit.  Contacted her today but patient stated that phone needed to be charged. Patient will call back.    Ronn Melena, [place] Coordination Social Worker [place] [phone]

## 2020-10-17 ENCOUNTER — Other Ambulatory Visit: Payer: Self-pay

## 2020-10-17 ENCOUNTER — Ambulatory Visit (INDEPENDENT_AMBULATORY_CARE_PROVIDER_SITE_OTHER): Payer: Medicaid Other | Admitting: Pharmacist

## 2020-10-17 DIAGNOSIS — B182 Chronic viral hepatitis C: Secondary | ICD-10-CM | POA: Diagnosis not present

## 2020-10-17 DIAGNOSIS — Z23 Encounter for immunization: Secondary | ICD-10-CM | POA: Diagnosis not present

## 2020-10-17 NOTE — Progress Notes (Signed)
HPI: Kimberly [name] is a 59 y.o. female who presents to the [place] clinic for Hepatitis C follow-up.  Medication: Mavyret x 8 weeks   Start Date: 05/15/20  Hepatitis C Genotype: 1b  Fibrosis Score: F0  Hepatitis C RNA: 11.2 million at initial visit, undetectable at 1 month   Patient Active Problem List   Diagnosis Date Noted   Chest wall trauma, initial encounter 09/01/2020   Chronic hepatitis C without hepatic coma ([place]) 04/18/2020   Gross hematuria 03/27/2020   Homeless 02/09/2019   History of nephrolithiasis 01/20/2019   Bipolar 1 disorder ([place]) 12/08/2018   Tobacco use disorder    History of alcohol abuse     Patient's Medications  New Prescriptions   No medications on file  Previous Medications   GLECAPREVIR-PIBRENTASVIR (MAVYRET) 100-40 MG TABS    Take 3 tablets by mouth daily. Take 3 tablets by mouth daily with a meal.   IBUPROFEN (ADVIL) 600 MG TABLET    Take 1 tablet (600 mg total) by mouth every 8 (eight) hours as needed. For back pain   MELOXICAM (MOBIC) 7.5 MG TABLET    Take 1 tablet (7.5 mg total) by mouth daily.  Modified Medications   No medications on file  Discontinued Medications   No medications on file    Allergies: No Known Allergies  Past Medical History: Past Medical History:  Diagnosis Date   Anxiety    Bipolar 1 disorder ([place])    Depression    Hepatitis C    Left nephrolithiasis    Left nephrolithiasis     Social History: Social History   Socioeconomic History   Marital status: Single    Spouse name: Not on file   Number of children: Not on file   Years of education: Not on file   Highest education level: Not on file  Occupational History   Not on file  Tobacco Use   Smoking status: Current Every Day Smoker    Packs/day: 1.00    Types: Cigarettes   Smokeless tobacco: Never Used   Tobacco comment: 1 PPD  Vaping Use   Vaping Use: Never used  Substance and Sexual Activity   Alcohol use:  Yes    Alcohol/week: 1.0 standard drink    Types: 1 Cans of beer per week    Comment: Sometimes.   Drug use: Yes    Types: Marijuana   Sexual activity: Not Currently  Other Topics Concern   Not on file  Social History Narrative   Not on file   Social Determinants of Health   Financial Resource Strain: High Risk   Difficulty of Paying Living Expenses: Very hard  Food Insecurity: Food Insecurity Present   Worried About [place] in the Last Year: Often true   Arboriculturist in the Last Year: Often true  Transportation Needs: No Transportation Needs   Lack of Transportation (Medical): No   Lack of Transportation (Non-Medical): No  Physical Activity:    Days of Exercise per Week: Not on file   Minutes of Exercise per Session: Not on file  Stress: Stress Concern Present   Feeling of Stress : Very much  Social Connections:    Frequency of Communication with Friends and Family: Not on file   Frequency of Social Gatherings with Friends and Family: Not on file   Attends Religious Services: Not on file   Active Member of Clubs or Organizations: Not on file   Attends Archivist  Meetings: Not on file   Marital Status: Not on file    Labs: Hepatitis C Lab Results  Component Value Date   HCVGENOTYPE 1b 04/18/2020   HCVRNAPCRQN <15 NOT DETECTED 06/12/2020   FIBROSTAGE F0 04/18/2020   Hepatitis B Lab Results  Component Value Date   HEPBSAB NON-REACTIVE 04/18/2020   HEPBSAG NON-REACTIVE 04/18/2020   Hepatitis A No results found for: HAV HIV Lab Results  Component Value Date   HIV Non Reactive 03/27/2020   HIV Non Reactive 11/18/2018   Lab Results  Component Value Date   CREATININE 0.63 08/31/2020   CREATININE 0.69 03/27/2020   CREATININE 1.10 (H) 01/21/2019   CREATININE 0.79 01/14/2019   CREATININE 0.63 12/08/2018   Lab Results  Component Value Date   AST 20 08/31/2020   AST 36 (H) 04/18/2020   AST 37 03/27/2020   ALT 13  08/31/2020   ALT 35 (H) 04/18/2020   ALT 32 (H) 04/18/2020   INR 0.9 04/18/2020    Assessment: Kimberly [name] presents to clinic today for her Hepatitis C SVR/cure visit. She completed 8 weeks of Mavyret in September without any complications. Her HCV RNA was undetectable at her 52-month follow-up visit. Explained to her that if her RNA remains negative today she has been cured of HCV but is still at risk for reinfection if practicing high risk activities. Also discussed that her HCV ab will be positive for life, and it does not indicate acute infection. Will check her HCV RNA today. She will not need follow-up with [place] as she did not demonstrate any fibrosis/cirrhosis and follows with internal medicine.   Her hepatitis B surface antibody is also negative so will start hepatitis B vaccination series today. She is scheduled to receive her COVID booster on 12/3. She did not want to receive the flu shot today.    Plan: Check HCV RNA today Start Hepatitis B vaccine series Follow-up with [facility] on 12/27 @ 10:45 for 2nd Hepatitis B vaccine  Alfonse Spruce, PharmD PGY2 ID Pharmacy Resident [phone]  10/17/2020, 9:37 AM

## 2020-10-19 LAB — HEPATITIS C RNA QUANTITATIVE
HCV Quantitative Log: <1.18 log IU/mL
HCV RNA, PCR, QN: <15 IU/mL

## 2020-10-27 ENCOUNTER — Ambulatory Visit: Payer: Medicaid Other

## 2020-11-20 ENCOUNTER — Ambulatory Visit (INDEPENDENT_AMBULATORY_CARE_PROVIDER_SITE_OTHER): Payer: Medicaid Other | Admitting: Pharmacist

## 2020-11-20 ENCOUNTER — Other Ambulatory Visit: Payer: Self-pay

## 2020-11-20 DIAGNOSIS — Z23 Encounter for immunization: Secondary | ICD-10-CM

## 2020-11-20 NOTE — Progress Notes (Signed)
Patient presents for 2nd vaccination against Hepatitis B. Counseling provided. No contraindications exists. Vaccine administered without incident.   Kimberly [name], PharmD, [place] PGY2 Ambulatory Care Resident [place]

## 2021-02-23 ENCOUNTER — Other Ambulatory Visit (HOSPITAL_COMMUNITY): Payer: Self-pay

## 2021-03-02 ENCOUNTER — Other Ambulatory Visit: Payer: Self-pay | Admitting: Internal Medicine

## 2021-03-02 DIAGNOSIS — Z1231 Encounter for screening mammogram for malignant neoplasm of breast: Secondary | ICD-10-CM

## 2021-03-05 IMAGING — MG DIGITAL SCREENING BILAT W/ TOMO W/ CAD
8 series · 9 of 24 positions shown · non-contrast
Comparison: Previous exam(s).

CLINICAL DATA: Screening.

EXAM:
DIGITAL SCREENING BILATERAL MAMMOGRAM WITH TOMO AND CAD

[L CC synth-2D]
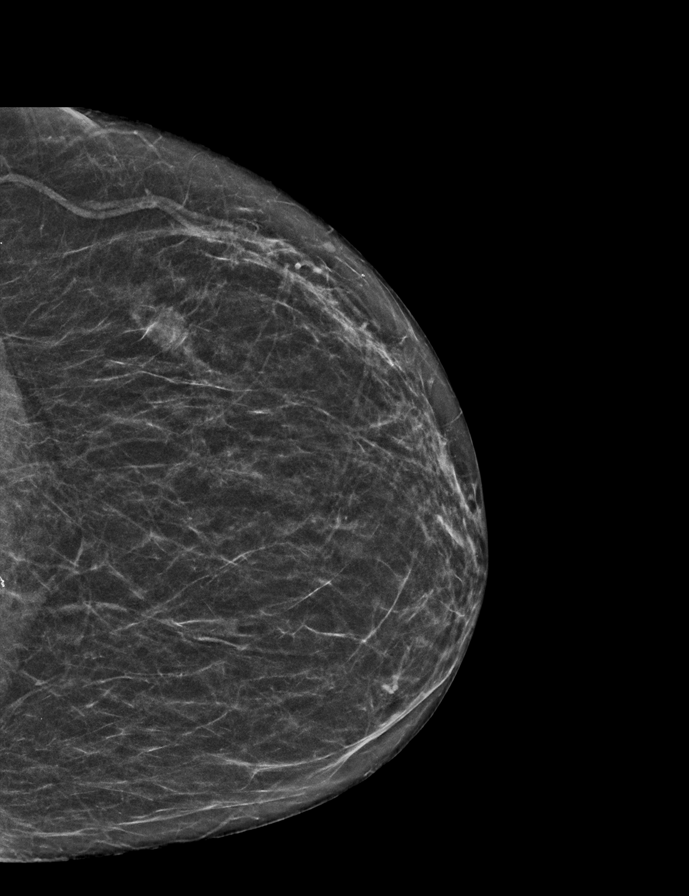

[R CC synth-2D]
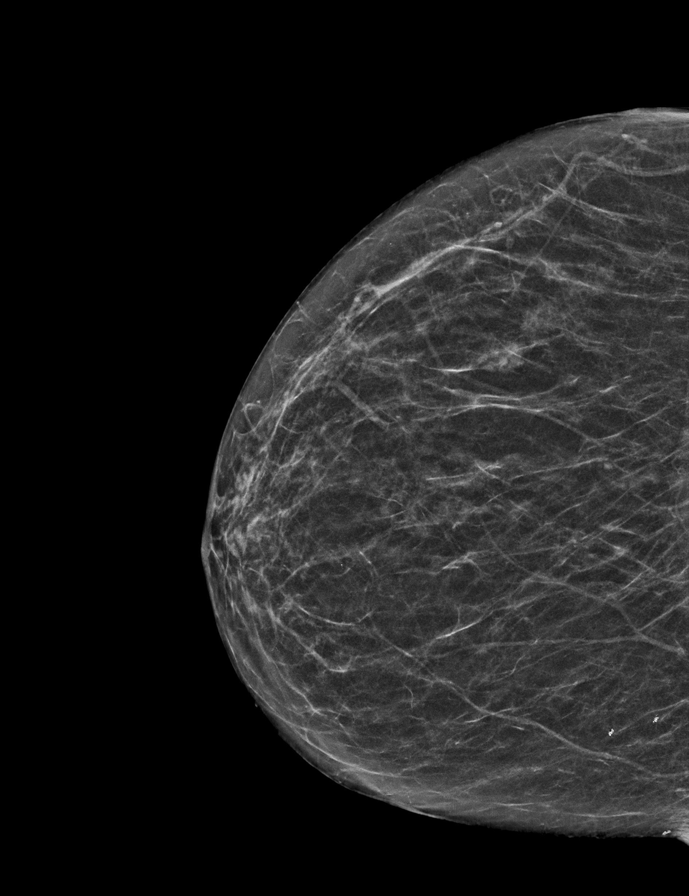

[R MLO synth-2D]
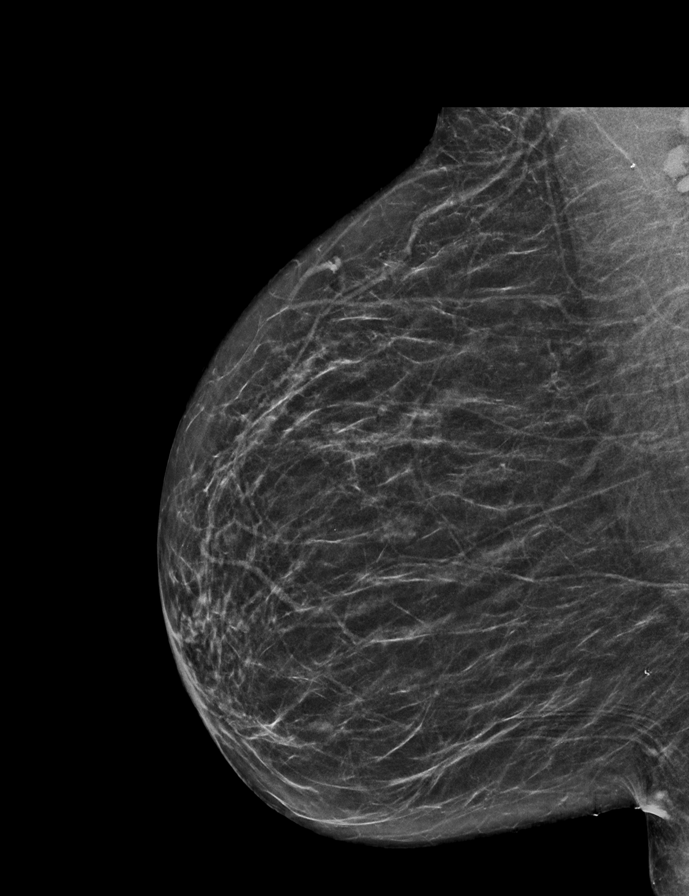

[L MLO synth-2D]
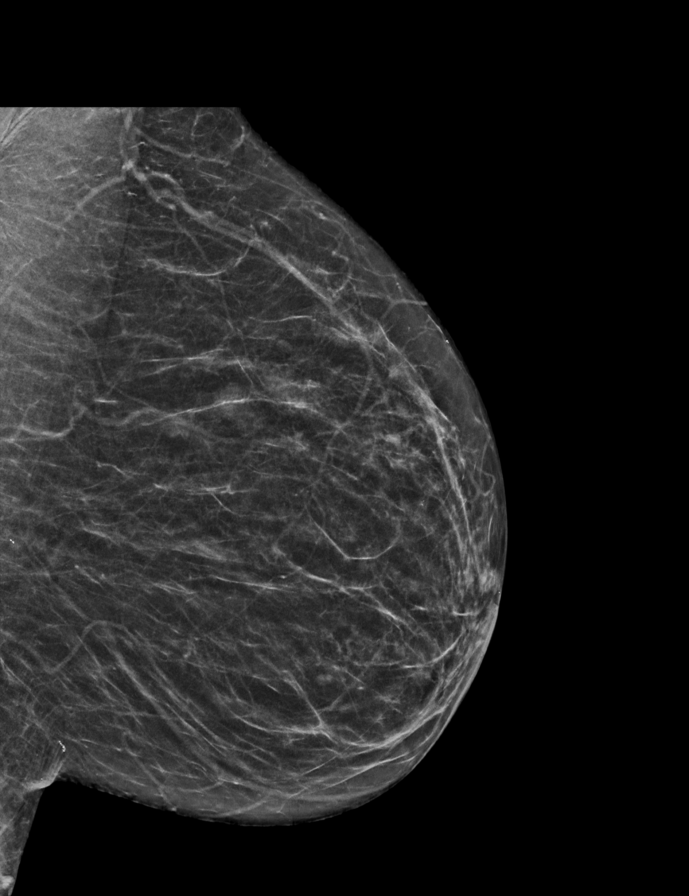

[R CC tomo · 2 of 45 frames shown]
[frame 15/45]
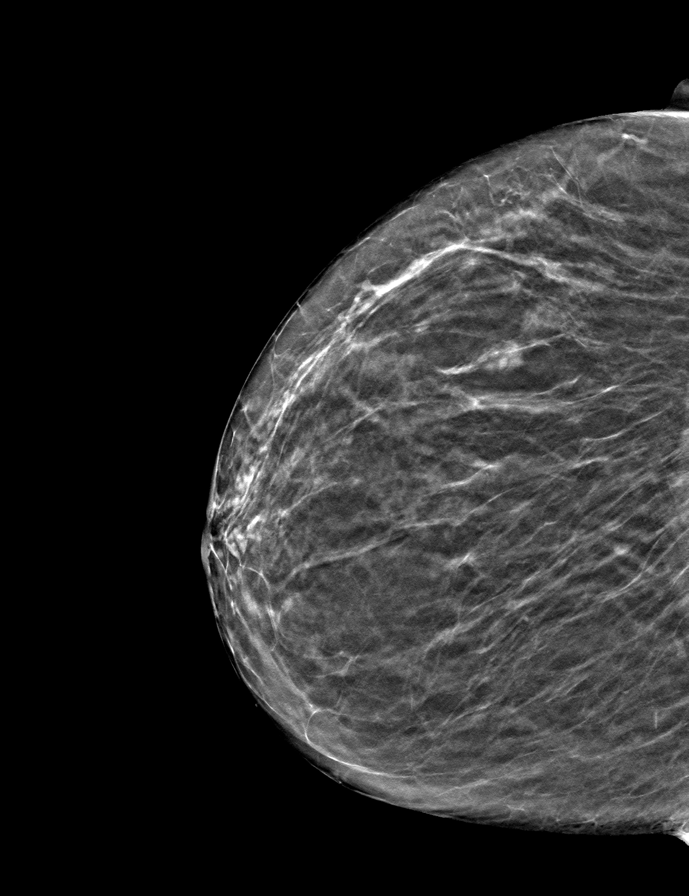
[frame 23/45]
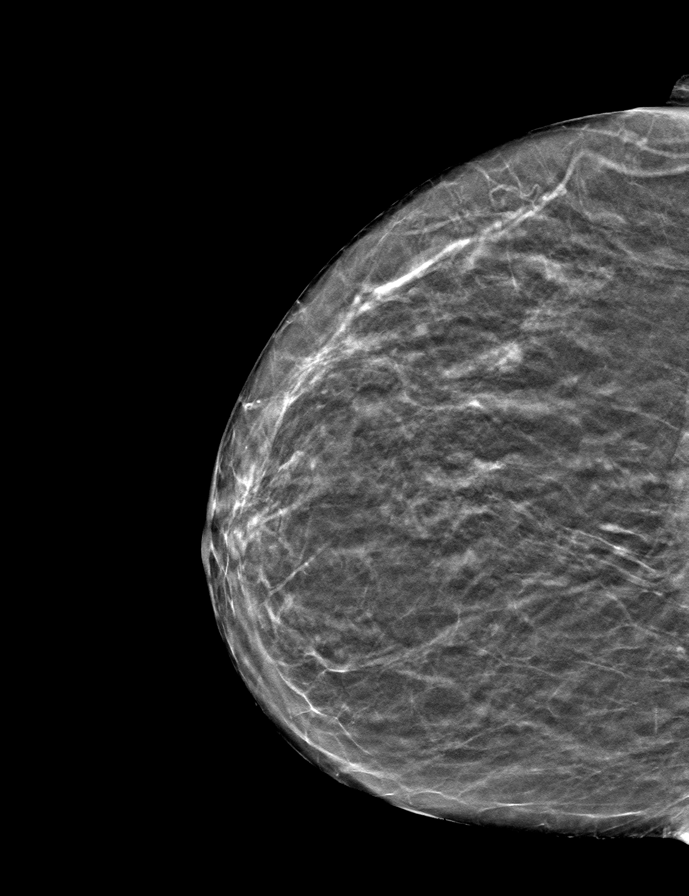

[R MLO tomo · tomo slice 25/49.0]
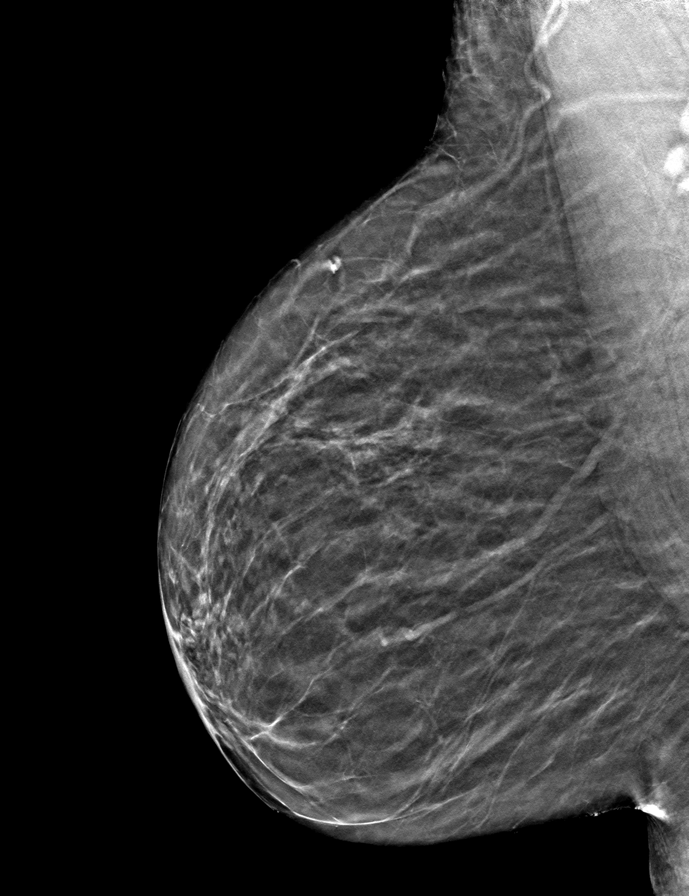

[L CC tomo · tomo slice 23/45.0]
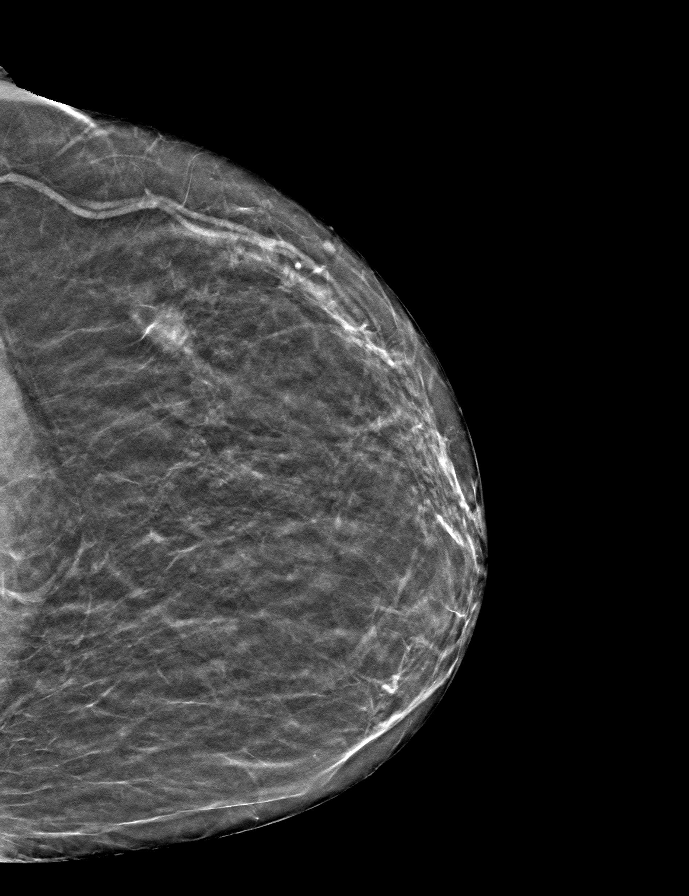

[L MLO tomo · tomo slice 25/49.0]
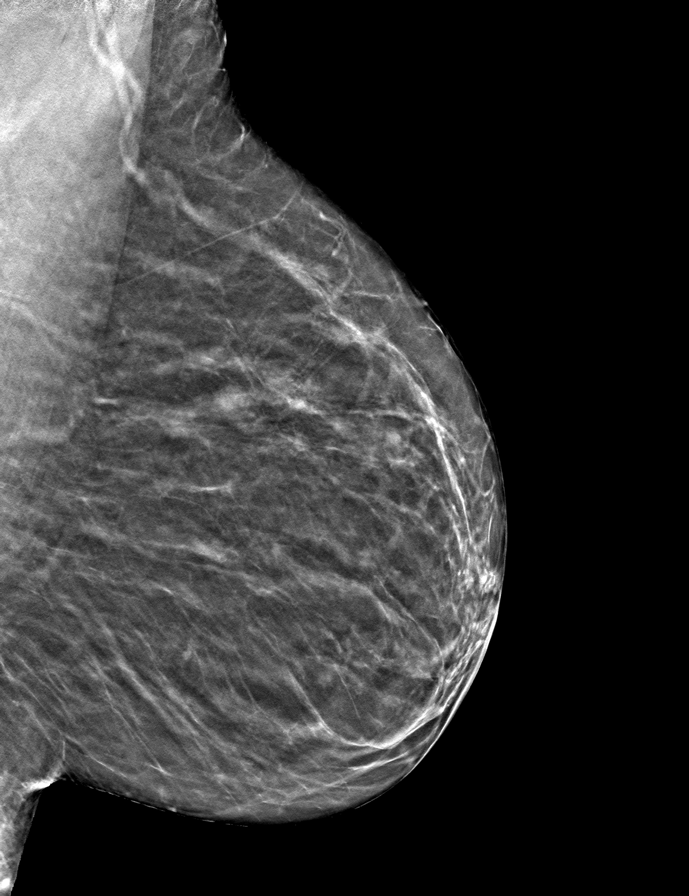

[9 of 24 positions shown; findings below may reference images not displayed]

ACR Breast Density Category b: There are scattered areas of
fibroglandular density.
FINDINGS: There are no findings suspicious for malignancy. Images were
processed with CAD.
IMPRESSION: No mammographic evidence of malignancy. A result letter of this
screening mammogram will be mailed directly to the patient.

RECOMMENDATION:
Screening mammogram in one year. (Code:[id number])

BI-RADS CATEGORY  1: Negative.

## 2021-04-14 ENCOUNTER — Encounter: Payer: Self-pay | Admitting: *Deleted

## 2021-04-24 ENCOUNTER — Ambulatory Visit: Payer: Medicaid Other

## 2022-03-14 ENCOUNTER — Encounter: Payer: Medicaid Other | Admitting: Internal Medicine

## 2022-05-18 ENCOUNTER — Encounter: Payer: Self-pay | Admitting: *Deleted

## 2023-05-20 ENCOUNTER — Ambulatory Visit (HOSPITAL_COMMUNITY): Payer: Medicaid Other | Admitting: Mental Health

## 2023-07-17 ENCOUNTER — Encounter (HOSPITAL_COMMUNITY): Payer: Self-pay

## 2023-07-17 ENCOUNTER — Other Ambulatory Visit: Payer: Self-pay

## 2023-07-17 ENCOUNTER — Emergency Department (HOSPITAL_COMMUNITY)
Admission: EM | Admit: 2023-07-17 | Discharge: 2023-07-17 | Disposition: A | Discharge status: Home / Self Care | Payer: MEDICAID | Attending: Emergency Medicine | Admitting: Emergency Medicine

## 2023-07-17 ENCOUNTER — Emergency Department (HOSPITAL_COMMUNITY): Payer: MEDICAID

## 2023-07-17 DIAGNOSIS — T40601A Poisoning by unspecified narcotics, accidental (unintentional), initial encounter: Secondary | ICD-10-CM | POA: Diagnosis present

## 2023-07-17 DIAGNOSIS — E876 Hypokalemia: Secondary | ICD-10-CM | POA: Diagnosis not present

## 2023-07-17 LAB — CBC WITH DIFFERENTIAL/PLATELET
Abs Immature Granulocytes: 0.11 10*3/uL — ABNORMAL HIGH (ref 0.00–0.07)
Basophils Absolute: 0 10*3/uL (ref 0.0–0.1)
Basophils Relative: 0 %
Eosinophils Absolute: 0.1 10*3/uL (ref 0.0–0.5)
Eosinophils Relative: 1 %
HCT: 38 % (ref 36.0–46.0)
Hemoglobin: 12.4 g/dL (ref 12.0–15.0)
Immature Granulocytes: 1 %
Lymphocytes Relative: 7 %
Lymphs Abs: 1 10*3/uL (ref 0.7–4.0)
MCH: 34 pg (ref 26.0–34.0)
MCHC: 32.6 g/dL (ref 30.0–36.0)
MCV: 104.1 fL — ABNORMAL HIGH (ref 80.0–100.0)
Monocytes Absolute: 0.7 10*3/uL (ref 0.1–1.0)
Monocytes Relative: 5 %
Neutro Abs: 13.2 10*3/uL — ABNORMAL HIGH (ref 1.7–7.7)
Neutrophils Relative %: 86 %
Platelets: 228 10*3/uL (ref 150–400)
RBC: 3.65 MIL/uL — ABNORMAL LOW (ref 3.87–5.11)
RDW: 13.5 % (ref 11.5–15.5)
WBC: 15.2 10*3/uL — ABNORMAL HIGH (ref 4.0–10.5)
nRBC: 0 % (ref 0.0–0.2)

## 2023-07-17 LAB — BASIC METABOLIC PANEL
Anion gap: 9 (ref 5–15)
BUN: 16 mg/dL (ref 6–20)
CO2: 19 mmol/L — ABNORMAL LOW (ref 22–32)
Calcium: 8.2 mg/dL — ABNORMAL LOW (ref 8.9–10.3)
Chloride: 110 mmol/L (ref 98–111)
Creatinine, Ser: 0.7 mg/dL (ref 0.44–1.00)
GFR, Estimated: >60 mL/min (ref >60)
Glucose, Bld: 129 mg/dL — ABNORMAL HIGH (ref 70–99)
Potassium: 2.9 mmol/L — ABNORMAL LOW (ref 3.5–5.1)
Sodium: 138 mmol/L (ref 135–145)

## 2023-07-17 MED ORDER — ONDANSETRON 4 MG PO TBDP
4.0000 mg | ORAL_TABLET | Freq: Once | ORAL | Status: AC
  Administered 2023-07-17: 4 mg via ORAL
  Filled 2023-07-17: qty 1

## 2023-07-17 MED ORDER — POTASSIUM CHLORIDE 20 MEQ PO PACK
40.0000 meq | PACK | Freq: Once | ORAL | Status: AC
  Administered 2023-07-17: 40 meq via ORAL
  Filled 2023-07-17: qty 2

## 2023-07-17 MED ORDER — NALOXONE HCL 4 MG/0.1ML NA LIQD: NASAL | 0 refills | Status: AC

## 2023-07-17 MED ORDER — NALOXONE HCL 2 MG/2ML IJ SOSY
PREFILLED_SYRINGE | INTRAMUSCULAR | Status: AC
  Administered 2023-07-17: 1 mg via INTRAVENOUS
  Filled 2023-07-17: qty 2

## 2023-07-17 MED ORDER — ONDANSETRON HCL 4 MG/2ML IJ SOLN
4.0000 mg | Freq: Once | INTRAMUSCULAR | Status: DC
  Filled 2023-07-17: qty 2

## 2023-07-17 NOTE — ED Provider Notes (Signed)
[place] EMERGENCY DEPARTMENT AT [facility] Provider Note   CSN: [id number] Arrival date & time: 07/17/23  1626     History Chief Complaint  Patient presents with   Drug Overdose    HPI Kimberly [name] is a 62 y.o. female presenting for altered mental status suspected drug overdose.  Brought in by EMS found to be having agonal respirations.  Her and her significant other was also in the emergency department thought they were taking cocaine when they both became unresponsive and bystanders called EMS. Patient required 3 mg Narcan prior to arrival with improvement of respiratory status.. Patient denied any symptoms before the drug use.  Patient's recorded medical, surgical, social, medication list and allergies were reviewed in the Snapshot window as part of the initial history.   Review of Systems   Review of Systems  Constitutional:  Negative for chills and fever.  HENT:  Negative for ear pain and sore throat.   Eyes:  Negative for pain and visual disturbance.  Respiratory:  Negative for cough and shortness of breath.   Cardiovascular:  Negative for chest pain and palpitations.  Gastrointestinal:  Negative for abdominal pain and vomiting.  Genitourinary:  Negative for dysuria and hematuria.  Musculoskeletal:  Negative for arthralgias and back pain.  Skin:  Negative for color change and rash.  Neurological:  Negative for seizures and syncope.  All other systems reviewed and are negative.   Physical Exam Updated Vital Signs BP (!) 157/90   Pulse 74   Temp 97.6 F (36.4 C) (Oral)   Resp 16   Ht 5\' 7"  (1.702 m)   Wt 54.4 kg   LMP 10/09/2012   SpO2 94%   BMI 18.79 kg/m    Physical Exam Vitals and nursing note reviewed.  Constitutional:      General: She is not in acute distress.    Appearance: She is well-developed.  HENT:     Head: Normocephalic and atraumatic.  Eyes:     Conjunctiva/sclera: Conjunctivae normal.  Cardiovascular:     Rate and  Rhythm: Normal rate and regular rhythm.     Heart sounds: No murmur heard. Pulmonary:     Effort: Pulmonary effort is normal. No respiratory distress.     Breath sounds: Normal breath sounds.  Abdominal:     Palpations: Abdomen is soft.     Tenderness: There is no abdominal tenderness.  Musculoskeletal:        General: No swelling.     Cervical back: Neck supple.  Skin:    General: Skin is warm and dry.     Capillary Refill: Capillary refill takes less than 2 seconds.  Neurological:     Mental Status: She is alert.  Psychiatric:        Mood and Affect: Mood normal.      ED Course/ Medical Decision Making/ A&P    Procedures .Critical Care  Performed by: Glyn Ade, MD Authorized by: Glyn Ade, MD   Critical care provider statement:    Critical care time (minutes):  90   Critical care was necessary to treat or prevent imminent or life-threatening deterioration of the following conditions:  Toxidrome   Critical care was time spent personally by me on the following activities:  Development of treatment plan with patient or surrogate, discussions with consultants, evaluation of patient's response to treatment, examination of patient, ordering and review of laboratory studies, ordering and review of radiographic studies, ordering and performing treatments and interventions, pulse oximetry, re-evaluation of  patient's condition and review of old charts    Medications Ordered in ED Medications  naloxone [facility]) 2 MG/2ML injection (1 mg Intravenous Given 07/17/23 1714)  potassium chloride (KLOR-CON) packet 40 mEq (40 mEq Oral Given 07/17/23 1940)  ondansetron (ZOFRAN-ODT) disintegrating tablet 4 mg (4 mg Oral Given 07/17/23 2109)    Medical Decision Making:    Kimberly [name] is a 62 y.o. female who presented to the ED today with chief complaint of overdose detailed above.     Handoff received from EMS.  Patient placed on continuous vitals and telemetry monitoring  while in ED which was reviewed periodically.   First physical exam: When I first evaluate the patient, she was with bradypnea, pinpoint pupils, unresponsive to questions with oxygen saturation to 80%.  Called nursing team to bedside.  She needs immediate treatment with Narcan 2 mg IV which was initiated.  Following this, she was started on 4 L nasal cannula observed at bedside for 15 minutes with improvement of her mental status.    Second physical exam improved mental and respiratory status.  She is upset stating that she is very distressed.   Reviewed and confirmed nursing documentation for past medical history, family history, social history.    Initial Assessment:   With the patient's presentation of altered mental status, most likely diagnosis is narcotic overdose. Other diagnoses were considered including (but not limited to) metabolic emergency, hyperglycemia, intracranial hemorrhage, intracranial mass. These are considered less likely due to history of present illness and physical exam findings.   This is most consistent with an acute life/limb threatening illness complicated by underlying chronic conditions.  Initial Plan:  Treated emergently with Narcan as above.  Will continue serial reassessments.  (7 PM will be 2 hours from Narcan administration) Screening labs including CBC and Metabolic panel to evaluate for infectious or metabolic etiology of disease.  Urinalysis with reflex culture ordered to evaluate for UTI or relevant urologic/nephrologic pathology.  Objective evaluation as below reviewed with plan for close reassessment  Initial Study Results:   Laboratory  All laboratory results reviewed without evidence of clinically relevant pathology.   Exceptions include: Mild hypokalemia treated with p.o.  EKG EKG was reviewed independently. Rate, rhythm, axis, intervals all examined and without medically relevant abnormality. ST segments without concerns for elevations.     Radiology  All images reviewed independently. Agree with radiology report at this time.   DG Chest Portable 1 View  Result Date: 07/17/2023 CLINICAL DATA:  Drug overdose, altered mental status. EXAM: PORTABLE CHEST 1 VIEW COMPARISON:  August 31, 2020. FINDINGS: The heart size and mediastinal contours are within normal limits. Both lungs are clear. The visualized skeletal structures are unremarkable. IMPRESSION: No active disease. Electronically Signed   By: Lupita Raider M.D.   On: 07/17/2023 18:28     Reassessment and Plan:   Observe patient for 4-1/2 hours in the emergency room.  At 90-minute she was borderline for desaturation event possibly requiring a third dose of Narcan, however she responded to nasal cannula and stimulation.  Continued observation until the 4 and half hour time period where she has been ambulatory and tolerating p.o. intake.  She feels comfortable discharge based on resolution of symptoms.  Family member will come pick patient up.  Will prescribe Narcan for outpatient use if needed.   Disposition:  I have considered need for hospitalization, however, considering all of the above, I believe this patient is stable for discharge at this time.  Patient/family educated  about specific return precautions for given chief complaint and symptoms.  Patient/family educated about follow-up with PCP.     Patient/family expressed understanding of return precautions and need for follow-up. Patient spoken to regarding all imaging and laboratory results and appropriate follow up for these results. All education provided in verbal form with additional information in written form. Time was allowed for answering of patient questions. Patient discharged.    Emergency Department Medication Summary:   Medications  naloxone [facility]) 2 MG/2ML injection (1 mg Intravenous Given 07/17/23 1714)  potassium chloride (KLOR-CON) packet 40 mEq (40 mEq Oral Given 07/17/23 1940)  ondansetron  (ZOFRAN-ODT) disintegrating tablet 4 mg (4 mg Oral Given 07/17/23 2109)         Clinical Impression:  1. Opiate overdose, accidental or unintentional, initial encounter [facility])      Discharge   Final Clinical Impression(s) / ED Diagnoses Final diagnoses:  Opiate overdose, accidental or unintentional, initial encounter [facility])    Rx / DC Orders ED Discharge Orders          Ordered    naloxone [facility]) nasal spray 4 mg/0.1 mL        07/17/23 2113              Glyn Ade, MD 07/17/23 2114

## 2023-07-17 NOTE — ED Notes (Signed)
Patients aunt called and said that the ambulance picked patient up from her house.  Rhodia Albright [phone].  She would like an update.

## 2023-07-17 NOTE — ED Triage Notes (Addendum)
Patient BIB EMS for drug overdose. Per patient she took an unknown drug. Patient reports she thought it was cocaine. Patient lost consciousness immediately after taking drug. Patient received a total of 3 mg Narcan, 4 mg zofran, and 500 ml of fluids. Patient responds to voice on arrival.
